# Patient Record
Sex: Female | Born: 1955 | Race: White | Hispanic: No | Marital: Married | Smoking: Former smoker
Health system: Southern US, Community
[De-identification: ages and names within clinical notes are randomized; demographics above are authoritative.]

## PROBLEM LIST (undated history)

## (undated) DIAGNOSIS — Z860100 Personal history of colon polyps, unspecified: Secondary | ICD-10-CM

## (undated) DIAGNOSIS — M1990 Unspecified osteoarthritis, unspecified site: Secondary | ICD-10-CM

## (undated) DIAGNOSIS — E039 Hypothyroidism, unspecified: Secondary | ICD-10-CM

## (undated) DIAGNOSIS — T7840XA Allergy, unspecified, initial encounter: Secondary | ICD-10-CM

## (undated) DIAGNOSIS — R011 Cardiac murmur, unspecified: Secondary | ICD-10-CM

## (undated) DIAGNOSIS — F419 Anxiety disorder, unspecified: Secondary | ICD-10-CM

## (undated) DIAGNOSIS — M8580 Other specified disorders of bone density and structure, unspecified site: Secondary | ICD-10-CM

## (undated) DIAGNOSIS — F32A Depression, unspecified: Secondary | ICD-10-CM

## (undated) DIAGNOSIS — H269 Unspecified cataract: Secondary | ICD-10-CM

## (undated) DIAGNOSIS — G4700 Insomnia, unspecified: Secondary | ICD-10-CM

## (undated) DIAGNOSIS — F988 Other specified behavioral and emotional disorders with onset usually occurring in childhood and adolescence: Secondary | ICD-10-CM

## (undated) DIAGNOSIS — G43009 Migraine without aura, not intractable, without status migrainosus: Secondary | ICD-10-CM

## (undated) DIAGNOSIS — K589 Irritable bowel syndrome without diarrhea: Secondary | ICD-10-CM

## (undated) DIAGNOSIS — N189 Chronic kidney disease, unspecified: Secondary | ICD-10-CM

## (undated) DIAGNOSIS — I10 Essential (primary) hypertension: Secondary | ICD-10-CM

## (undated) DIAGNOSIS — R634 Abnormal weight loss: Secondary | ICD-10-CM

## (undated) DIAGNOSIS — D126 Benign neoplasm of colon, unspecified: Secondary | ICD-10-CM

## (undated) DIAGNOSIS — F329 Major depressive disorder, single episode, unspecified: Secondary | ICD-10-CM

## (undated) HISTORY — DX: Hypothyroidism, unspecified: E03.9

## (undated) HISTORY — DX: Other specified disorders of bone density and structure, unspecified site: M85.80

## (undated) HISTORY — DX: Benign neoplasm of colon, unspecified: D12.6

## (undated) HISTORY — DX: Personal history of colon polyps, unspecified: Z86.0100

## (undated) HISTORY — DX: Unspecified cataract: H26.9

## (undated) HISTORY — DX: Anxiety disorder, unspecified: F41.9

## (undated) HISTORY — DX: Insomnia, unspecified: G47.00

## (undated) HISTORY — DX: Personal history of colonic polyps: Z86.010

## (undated) HISTORY — DX: Irritable bowel syndrome, unspecified: K58.9

## (undated) HISTORY — DX: Abnormal weight loss: R63.4

## (undated) HISTORY — DX: Depression, unspecified: F32.A

## (undated) HISTORY — DX: Allergy, unspecified, initial encounter: T78.40XA

## (undated) HISTORY — DX: Other specified behavioral and emotional disorders with onset usually occurring in childhood and adolescence: F98.8

## (undated) HISTORY — DX: Chronic kidney disease, unspecified: N18.9

## (undated) HISTORY — PX: ABDOMINAL HYSTERECTOMY: SHX81

## (undated) HISTORY — DX: Migraine without aura, not intractable, without status migrainosus: G43.009

## (undated) HISTORY — DX: Cardiac murmur, unspecified: R01.1

## (undated) HISTORY — PX: CATARACT EXTRACTION: SUR2

## (undated) HISTORY — PX: COLONOSCOPY: SHX174

## (undated) HISTORY — PX: EYE SURGERY: SHX253

## (undated) HISTORY — DX: Essential (primary) hypertension: I10

## (undated) HISTORY — DX: Unspecified osteoarthritis, unspecified site: M19.90

## (undated) HISTORY — PX: POLYPECTOMY: SHX149

## (undated) HISTORY — PX: APPENDECTOMY: SHX54

## (undated) HISTORY — DX: Major depressive disorder, single episode, unspecified: F32.9

---

## 2001-01-05 ENCOUNTER — Encounter: Payer: Self-pay | Admitting: Family Medicine

## 2001-01-05 ENCOUNTER — Encounter: Admission: RE | Admit: 2001-01-05 | Discharge: 2001-01-05 | Payer: Self-pay | Admitting: Family Medicine

## 2001-03-05 ENCOUNTER — Encounter: Payer: Self-pay | Admitting: Otolaryngology

## 2001-03-05 ENCOUNTER — Encounter: Admission: RE | Admit: 2001-03-05 | Discharge: 2001-03-05 | Payer: Self-pay | Admitting: Otolaryngology

## 2001-03-24 ENCOUNTER — Encounter: Admission: RE | Admit: 2001-03-24 | Discharge: 2001-03-24 | Payer: Self-pay | Admitting: Internal Medicine

## 2001-07-10 ENCOUNTER — Encounter: Payer: Self-pay | Admitting: Family Medicine

## 2001-07-10 ENCOUNTER — Encounter: Admission: RE | Admit: 2001-07-10 | Discharge: 2001-07-10 | Payer: Self-pay | Admitting: Family Medicine

## 2002-02-02 ENCOUNTER — Encounter: Payer: Self-pay | Admitting: Family Medicine

## 2002-02-02 ENCOUNTER — Encounter: Admission: RE | Admit: 2002-02-02 | Discharge: 2002-02-02 | Payer: Self-pay | Admitting: Family Medicine

## 2002-03-29 ENCOUNTER — Other Ambulatory Visit: Admission: RE | Admit: 2002-03-29 | Discharge: 2002-03-29 | Payer: Self-pay | Admitting: Obstetrics and Gynecology

## 2002-12-07 ENCOUNTER — Encounter: Admission: RE | Admit: 2002-12-07 | Discharge: 2002-12-07 | Payer: Self-pay | Admitting: Family Medicine

## 2002-12-07 ENCOUNTER — Encounter: Payer: Self-pay | Admitting: Family Medicine

## 2004-07-18 ENCOUNTER — Ambulatory Visit: Payer: Self-pay | Admitting: Family Medicine

## 2004-08-19 ENCOUNTER — Encounter: Admission: RE | Admit: 2004-08-19 | Discharge: 2004-08-19 | Payer: Self-pay | Admitting: Orthopedic Surgery

## 2004-09-06 ENCOUNTER — Encounter: Admission: RE | Admit: 2004-09-06 | Discharge: 2004-09-06 | Payer: Self-pay | Admitting: Orthopedic Surgery

## 2005-03-14 ENCOUNTER — Ambulatory Visit: Payer: Self-pay | Admitting: Family Medicine

## 2005-06-26 ENCOUNTER — Ambulatory Visit: Payer: Self-pay | Admitting: Family Medicine

## 2005-07-26 ENCOUNTER — Ambulatory Visit: Payer: Self-pay | Admitting: Internal Medicine

## 2005-07-26 ENCOUNTER — Encounter: Payer: Self-pay | Admitting: Family Medicine

## 2005-08-22 ENCOUNTER — Ambulatory Visit: Payer: Self-pay | Admitting: Family Medicine

## 2005-08-30 ENCOUNTER — Ambulatory Visit: Payer: Self-pay | Admitting: Gastroenterology

## 2005-11-12 ENCOUNTER — Ambulatory Visit: Payer: Self-pay | Admitting: Gastroenterology

## 2005-11-19 ENCOUNTER — Ambulatory Visit: Payer: Self-pay | Admitting: Gastroenterology

## 2005-11-19 ENCOUNTER — Encounter (INDEPENDENT_AMBULATORY_CARE_PROVIDER_SITE_OTHER): Payer: Self-pay | Admitting: *Deleted

## 2005-11-19 DIAGNOSIS — D126 Benign neoplasm of colon, unspecified: Secondary | ICD-10-CM | POA: Insufficient documentation

## 2005-11-19 DIAGNOSIS — K635 Polyp of colon: Secondary | ICD-10-CM | POA: Insufficient documentation

## 2005-11-19 HISTORY — DX: Benign neoplasm of colon, unspecified: D12.6

## 2005-11-19 LAB — HM COLONOSCOPY

## 2005-12-04 ENCOUNTER — Ambulatory Visit: Payer: Self-pay | Admitting: Family Medicine

## 2005-12-05 ENCOUNTER — Ambulatory Visit: Payer: Self-pay | Admitting: Family Medicine

## 2006-01-03 ENCOUNTER — Ambulatory Visit: Payer: Self-pay | Admitting: Family Medicine

## 2006-02-13 ENCOUNTER — Ambulatory Visit: Payer: Self-pay | Admitting: Family Medicine

## 2006-02-27 ENCOUNTER — Ambulatory Visit: Payer: Self-pay | Admitting: Gastroenterology

## 2006-03-20 ENCOUNTER — Ambulatory Visit: Payer: Self-pay | Admitting: Family Medicine

## 2006-03-24 ENCOUNTER — Encounter: Admission: RE | Admit: 2006-03-24 | Discharge: 2006-03-24 | Payer: Self-pay | Admitting: Family Medicine

## 2006-07-31 ENCOUNTER — Ambulatory Visit: Payer: Self-pay | Admitting: Family Medicine

## 2006-07-31 LAB — CONVERTED CEMR LAB
Basophils Absolute: 0.1 10*3/uL (ref 0.0–0.1)
Basophils Relative: 1 % (ref 0.0–1.0)
Hemoglobin: 13.6 g/dL (ref 12.0–15.0)
Lymphocytes Relative: 30.3 % (ref 12.0–46.0)
MCHC: 33.5 g/dL (ref 30.0–36.0)
MCV: 94.5 fL (ref 78.0–100.0)
Monocytes Absolute: 0.5 10*3/uL (ref 0.2–0.7)
Monocytes Relative: 8.8 % (ref 3.0–11.0)
Neutro Abs: 2.9 10*3/uL (ref 1.4–7.7)

## 2006-08-02 ENCOUNTER — Encounter: Admission: RE | Admit: 2006-08-02 | Discharge: 2006-08-02 | Payer: Self-pay | Admitting: Family Medicine

## 2006-08-04 ENCOUNTER — Encounter: Admission: RE | Admit: 2006-08-04 | Discharge: 2006-08-04 | Payer: Self-pay | Admitting: Family Medicine

## 2006-10-16 ENCOUNTER — Ambulatory Visit: Payer: Self-pay | Admitting: Family Medicine

## 2006-12-17 ENCOUNTER — Ambulatory Visit: Payer: Self-pay | Admitting: Family Medicine

## 2006-12-17 LAB — CONVERTED CEMR LAB
Basophils Absolute: 0 10*3/uL (ref 0.0–0.1)
CO2: 34 meq/L — ABNORMAL HIGH (ref 19–32)
Eosinophils Absolute: 0.1 10*3/uL (ref 0.0–0.6)
Eosinophils Relative: 2.2 % (ref 0.0–5.0)
MCV: 92.9 fL (ref 78.0–100.0)
Platelets: 332 10*3/uL (ref 150–400)
RBC: 4.4 M/uL (ref 3.87–5.11)
Sodium: 142 meq/L (ref 135–145)
WBC: 6 10*3/uL (ref 4.5–10.5)

## 2006-12-19 ENCOUNTER — Encounter: Payer: Self-pay | Admitting: Family Medicine

## 2006-12-19 DIAGNOSIS — G43909 Migraine, unspecified, not intractable, without status migrainosus: Secondary | ICD-10-CM | POA: Insufficient documentation

## 2006-12-19 DIAGNOSIS — G43009 Migraine without aura, not intractable, without status migrainosus: Secondary | ICD-10-CM | POA: Insufficient documentation

## 2006-12-19 DIAGNOSIS — F988 Other specified behavioral and emotional disorders with onset usually occurring in childhood and adolescence: Secondary | ICD-10-CM | POA: Insufficient documentation

## 2007-01-07 ENCOUNTER — Inpatient Hospital Stay (HOSPITAL_COMMUNITY): Admission: EM | Admit: 2007-01-07 | Discharge: 2007-01-09 | Payer: Self-pay | Admitting: Emergency Medicine

## 2007-01-07 ENCOUNTER — Ambulatory Visit: Payer: Self-pay | Admitting: Gastroenterology

## 2007-01-13 ENCOUNTER — Ambulatory Visit: Payer: Self-pay | Admitting: Gastroenterology

## 2007-01-19 ENCOUNTER — Emergency Department (HOSPITAL_COMMUNITY): Admission: EM | Admit: 2007-01-19 | Discharge: 2007-01-19 | Payer: Self-pay | Admitting: Emergency Medicine

## 2007-01-21 ENCOUNTER — Ambulatory Visit: Payer: Self-pay | Admitting: Family Medicine

## 2007-01-27 ENCOUNTER — Ambulatory Visit: Payer: Self-pay | Admitting: Family Medicine

## 2007-02-19 ENCOUNTER — Ambulatory Visit: Payer: Self-pay | Admitting: Family Medicine

## 2007-04-02 ENCOUNTER — Encounter: Payer: Self-pay | Admitting: Family Medicine

## 2007-04-09 ENCOUNTER — Encounter: Admission: RE | Admit: 2007-04-09 | Discharge: 2007-04-09 | Payer: Self-pay | Admitting: Family Medicine

## 2007-04-16 ENCOUNTER — Encounter: Payer: Self-pay | Admitting: Family Medicine

## 2007-06-30 ENCOUNTER — Ambulatory Visit: Payer: Self-pay | Admitting: Family Medicine

## 2007-06-30 DIAGNOSIS — M899 Disorder of bone, unspecified: Secondary | ICD-10-CM | POA: Insufficient documentation

## 2007-06-30 DIAGNOSIS — G4700 Insomnia, unspecified: Secondary | ICD-10-CM | POA: Insufficient documentation

## 2007-06-30 DIAGNOSIS — M949 Disorder of cartilage, unspecified: Secondary | ICD-10-CM

## 2007-06-30 HISTORY — DX: Disorder of bone, unspecified: M89.9

## 2007-08-12 ENCOUNTER — Ambulatory Visit: Payer: Self-pay | Admitting: Family Medicine

## 2007-08-12 DIAGNOSIS — F341 Dysthymic disorder: Secondary | ICD-10-CM | POA: Insufficient documentation

## 2007-08-20 ENCOUNTER — Ambulatory Visit: Payer: Self-pay | Admitting: Family Medicine

## 2007-08-20 DIAGNOSIS — R634 Abnormal weight loss: Secondary | ICD-10-CM

## 2007-08-20 HISTORY — DX: Abnormal weight loss: R63.4

## 2007-09-09 ENCOUNTER — Ambulatory Visit: Payer: Self-pay | Admitting: Family Medicine

## 2007-09-09 DIAGNOSIS — R197 Diarrhea, unspecified: Secondary | ICD-10-CM | POA: Insufficient documentation

## 2007-09-09 DIAGNOSIS — K589 Irritable bowel syndrome without diarrhea: Secondary | ICD-10-CM | POA: Insufficient documentation

## 2007-10-08 ENCOUNTER — Ambulatory Visit: Payer: Self-pay | Admitting: Family Medicine

## 2007-10-08 DIAGNOSIS — I10 Essential (primary) hypertension: Secondary | ICD-10-CM | POA: Insufficient documentation

## 2007-11-12 IMAGING — US US CAROTID DUPLEX BILAT
1 series · 13 of 24 positions shown · non-contrast
Comparison: none

CLINICAL DATA: Right carotid bruit, dizziness, and hypertension. 
 BILATERAL CAROTID DUPLEX ULTRASOUND:
 No prior ultrasound studies for comparison.

[Series 1: unknown · 0.06mm/px · 13 of 58 slices shown]
[im 1/58]
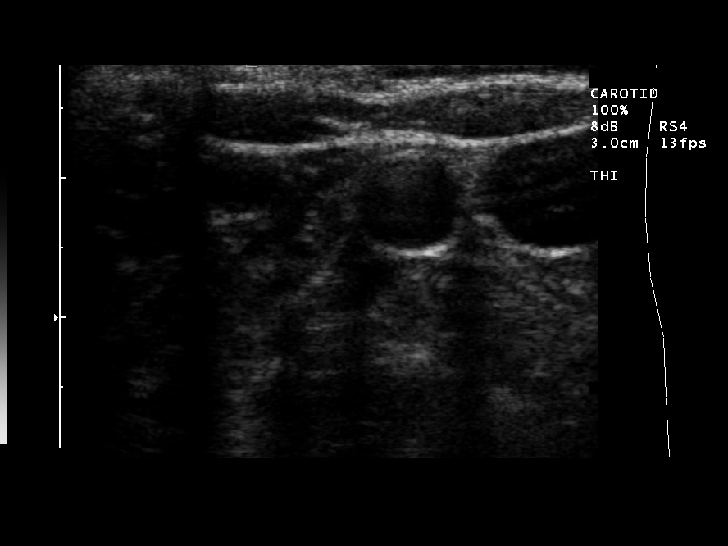
[im 5/58]
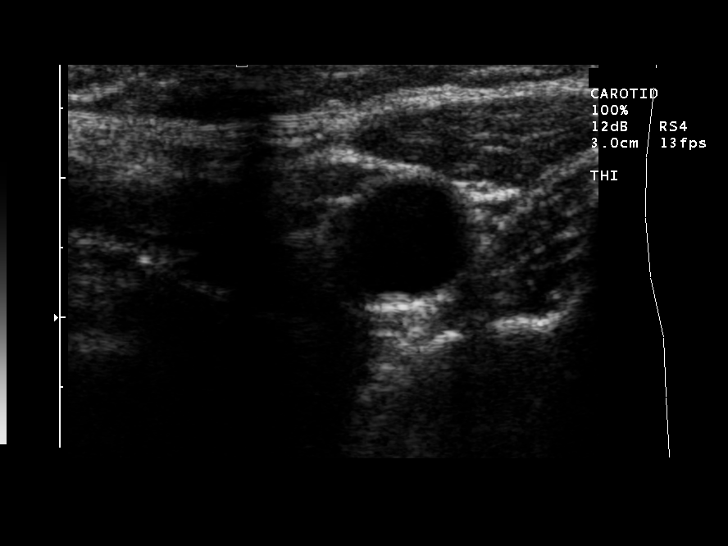
[im 10/58]
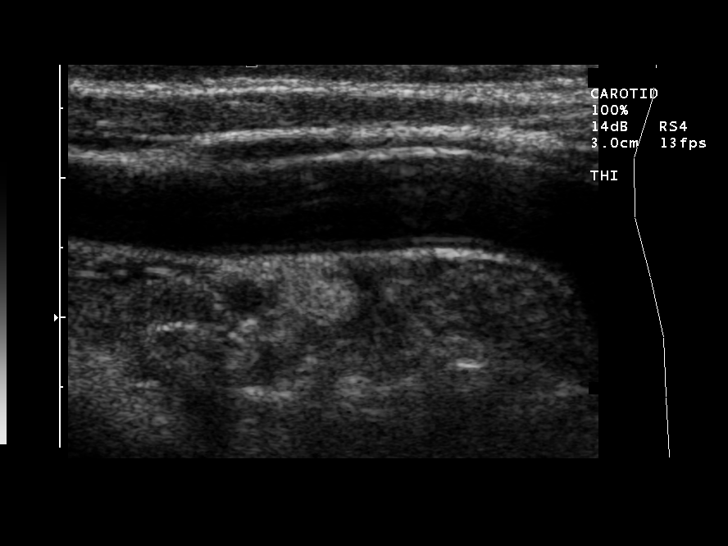
[im 15/58]
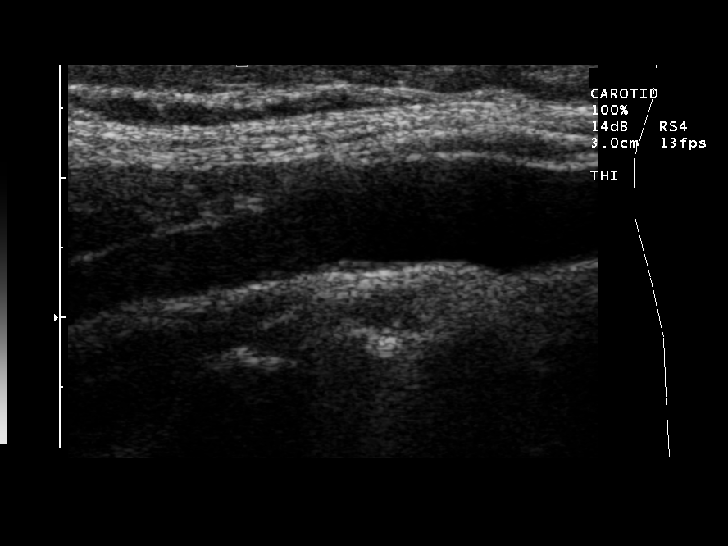
[im 20/58]
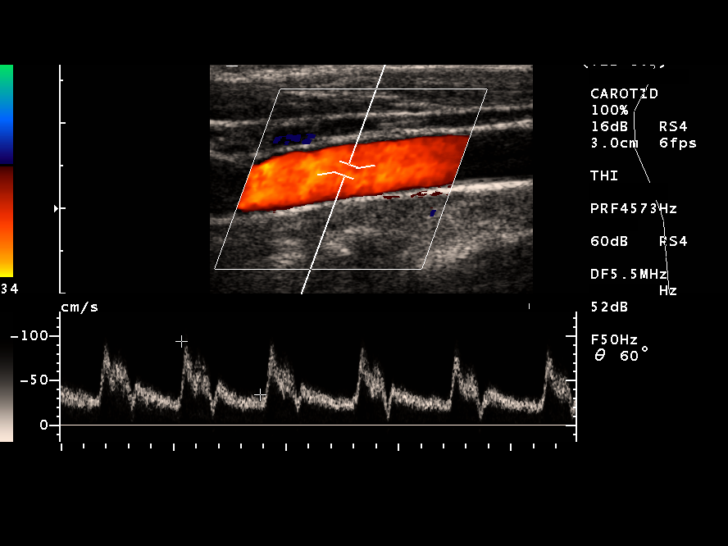
[im 25/58]
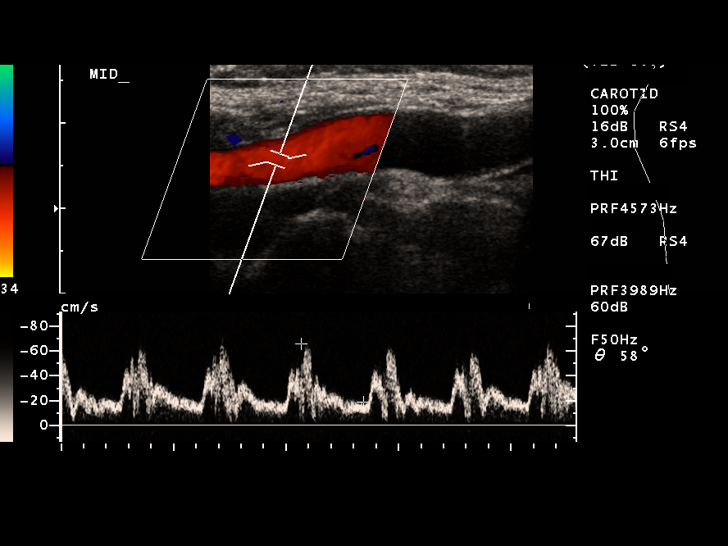
[im 30/58]
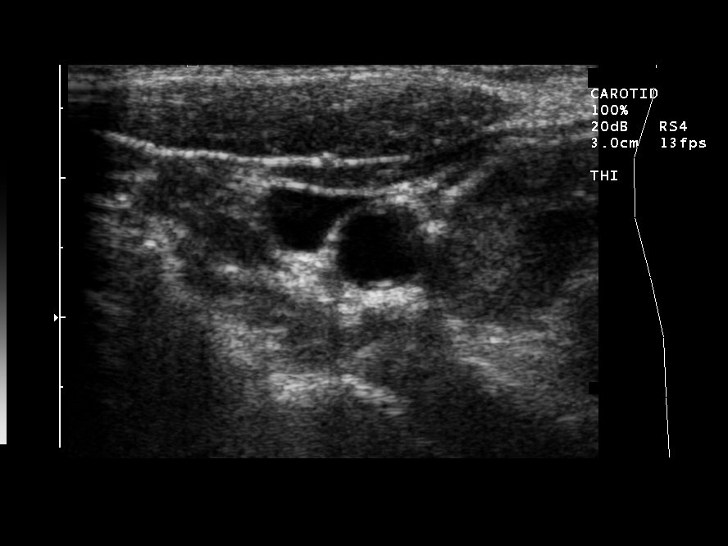
[im 33/58]
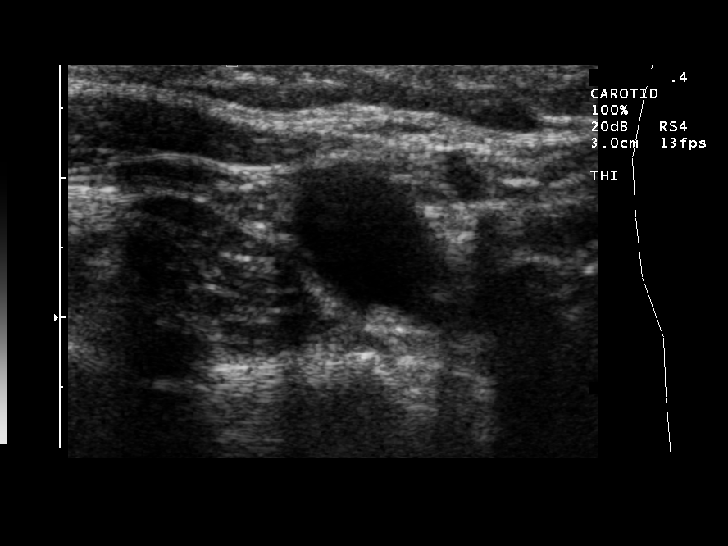
[im 38/58]
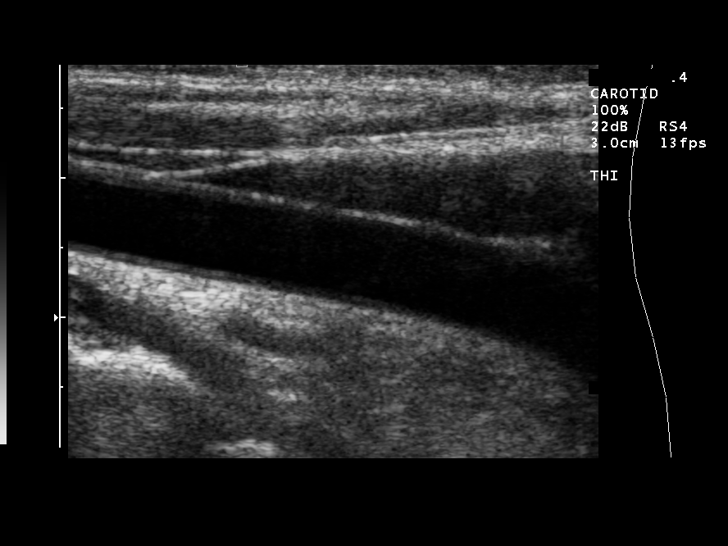
[im 43/58]
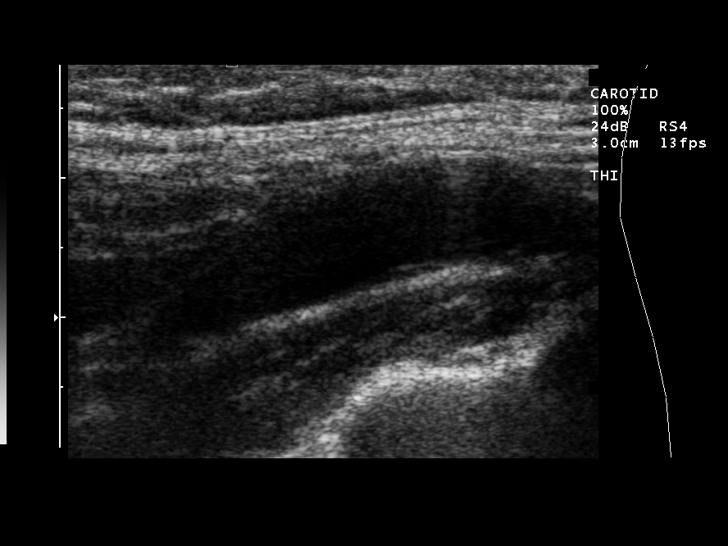
[im 48/58]
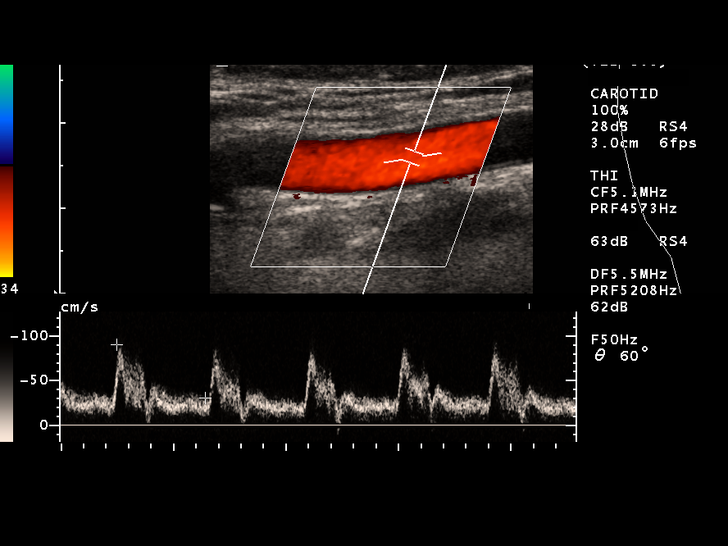
[im 53/58]
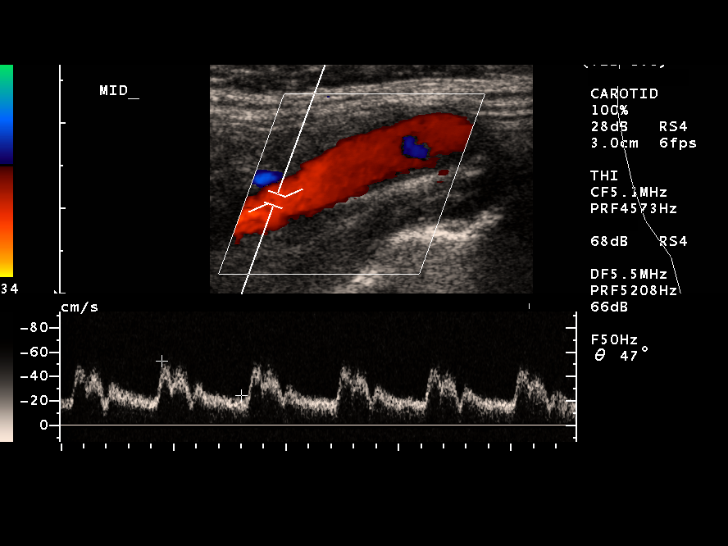
[im 58/58]
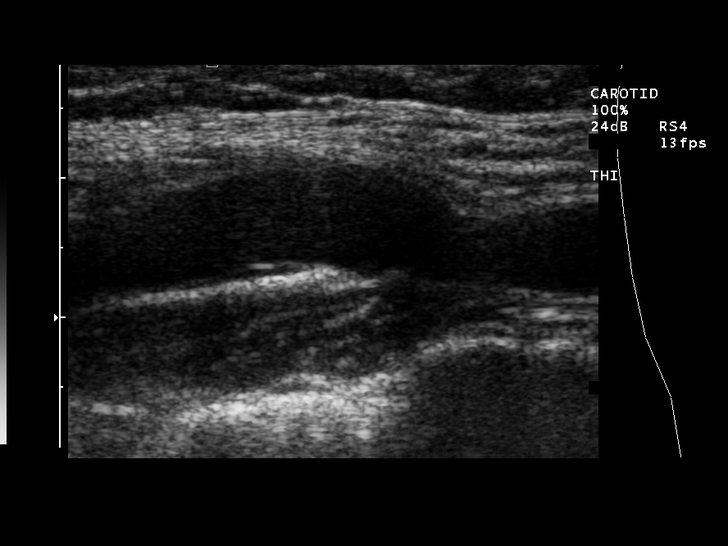

[13 of 24 positions shown; findings below may reference images not displayed]

FINDINGS: The following Doppler flow velocity measurements were obtained (in cm/sec):
 SITE:  PEAK SYSTOLIC  END DIASTOLIC
 RIGHT ICA:  80  24 
 RIGHT CCA:  93  34
 RIGHT ICA/CCA RATIO:  .86  .70
 RIGHT ECA:  86
 LEFT ICA:  86  25
 LEFT CCA:  107  26
 LEFT ICA/CCA RATIO:  .80  .97
 LEFT ECA:  76
 Criteria:  Quantification of carotid stenosis is based on velocity parameters that correlate the residual internal carotid diameter with NASCET ? based stenosis levels. 
 The right carotid bifurcation shows minimal calcified plaque at the level of the distal bulb.  Obtained wave forms and velocities in the common carotid artery, internal carotid artery, and external carotid artery are normal.  Estimated proximal right ICA stenosis is less than 50%.  Antegrade flow is demonstrated in the right vertebral artery. 
 The left carotid bifurcation shows a small amount of plaque at the level of the bulb and extending into the proximal ICA.  Overall plaque burden on the left is slightly greater than on the right.  Obtained wave forms and velocities are normal in the common carotid, internal carotid, and external carotid arteries.  Estimated left ICA stenosis is less than 50% in the neck.  Antegrade flow is also detected in the left vertebral artery.
IMPRESSION: No evidence of significant carotid stenoses by duplex ultrasound.  Estimated bilateral less than 50% proximal ICA stenoses based on velocity criteria.  A small amount of plaque is present at both carotid bifurcations, left greater than right.

## 2007-11-18 ENCOUNTER — Ambulatory Visit: Payer: Self-pay | Admitting: Family Medicine

## 2007-11-25 ENCOUNTER — Telehealth: Payer: Self-pay | Admitting: Family Medicine

## 2007-12-16 ENCOUNTER — Ambulatory Visit: Payer: Self-pay | Admitting: Family Medicine

## 2008-01-14 ENCOUNTER — Telehealth: Payer: Self-pay | Admitting: Family Medicine

## 2008-01-20 ENCOUNTER — Telehealth: Payer: Self-pay | Admitting: Family Medicine

## 2008-01-21 ENCOUNTER — Telehealth: Payer: Self-pay | Admitting: Family Medicine

## 2008-01-26 ENCOUNTER — Telehealth (INDEPENDENT_AMBULATORY_CARE_PROVIDER_SITE_OTHER): Payer: Self-pay | Admitting: *Deleted

## 2008-01-26 ENCOUNTER — Encounter: Payer: Self-pay | Admitting: Family Medicine

## 2008-04-28 IMAGING — CT CT PELVIS W/O CM
1 series · 15 of 32 positions shown, 19 images · IV contrast (agent unspecified)
Comparison: 01/07/07.

CLINICAL DATA: Lower abdominal and flank pain for 3 days.  Assess for kidney stone. 
 ABDOMEN CT WITHOUT CONTRAST:
TECHNIQUE: Multidetector CT imaging of the abdomen was performed following the standard protocol without IV contrast.
TECHNIQUE: Multidetector CT imaging of the pelvis was performed following the standard protocol without IV contrast.

[Series 2: stone_wo 5.0 b40f st · axial · 0.66mm/px · z∈[-381,-41]mm · 15 of 96 slices shown, 19 images]
[im 7/96  soft-tissue]
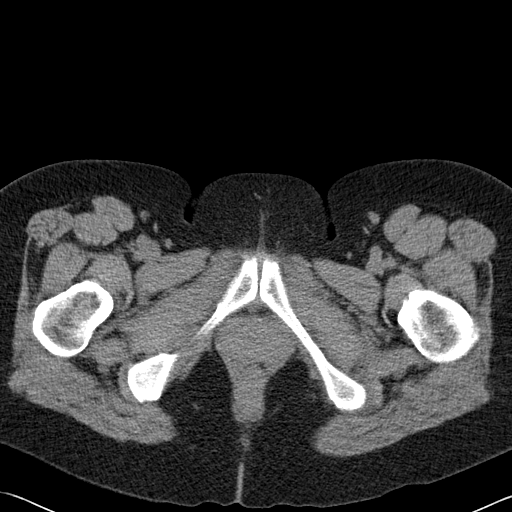
[im 7/96  bone]
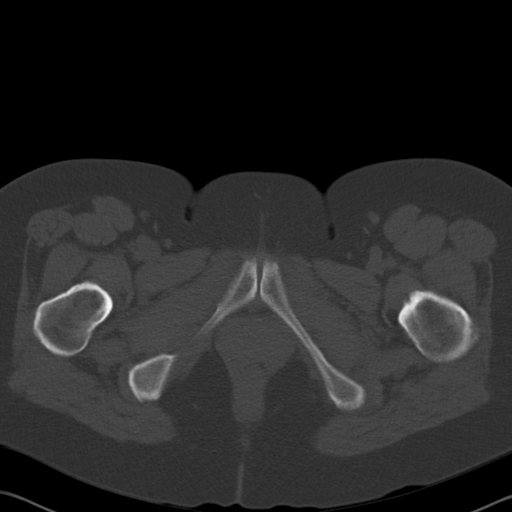
[im 13/96  soft-tissue]
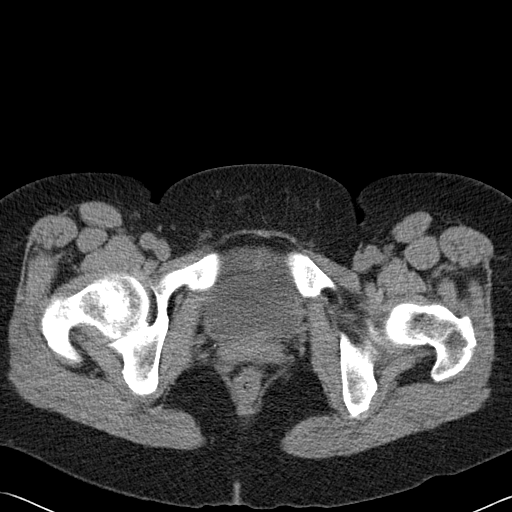
[im 19/96  soft-tissue]
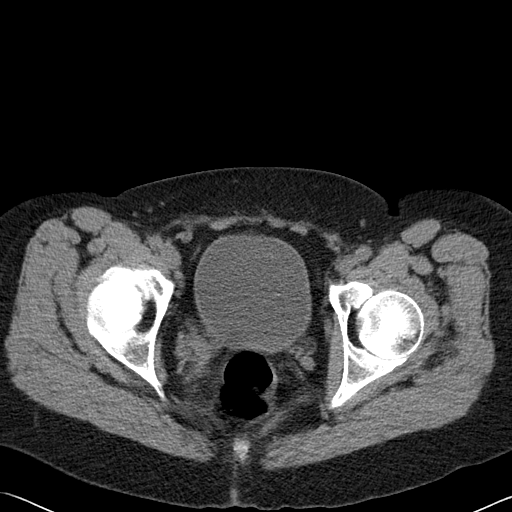
[im 28/96  soft-tissue]
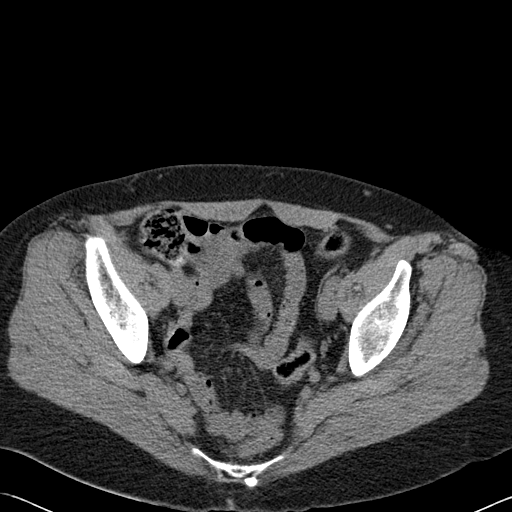
[im 34/96  soft-tissue]
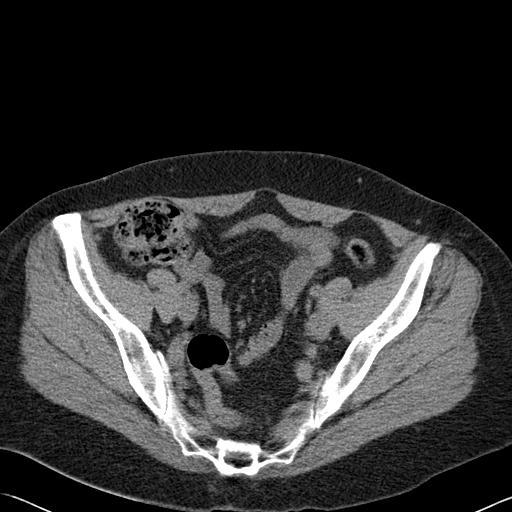
[im 40/96  soft-tissue]
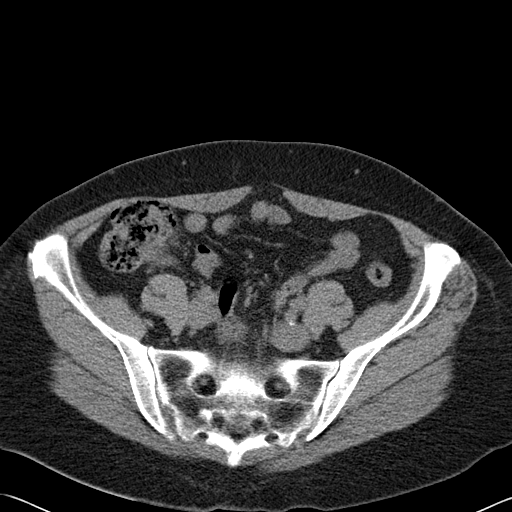
[im 50/96  soft-tissue]
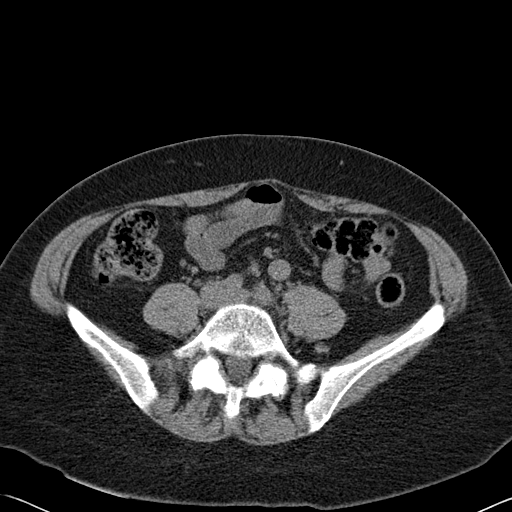
[im 56/96  soft-tissue]
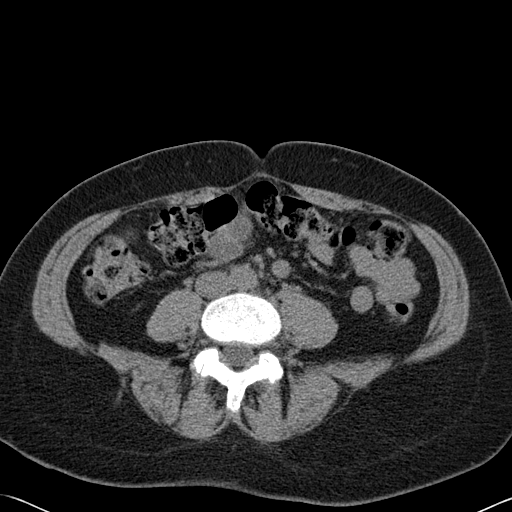
[im 62/96  soft-tissue]
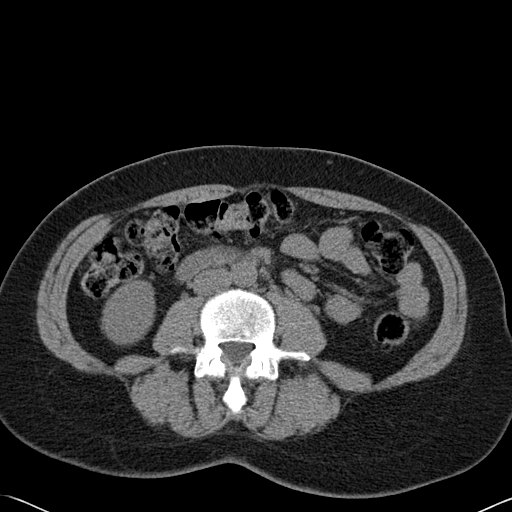
[im 62/96  bone]
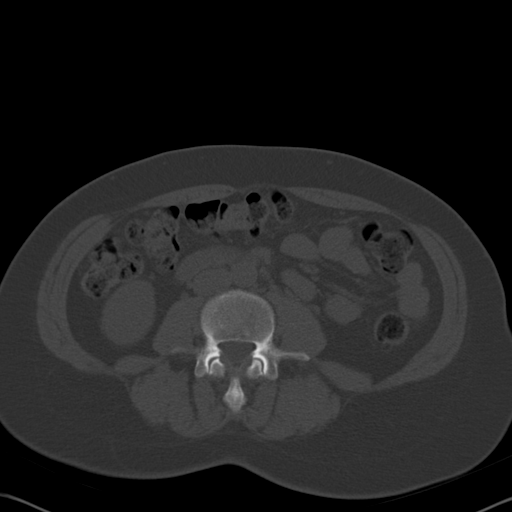
[im 68/96  soft-tissue]
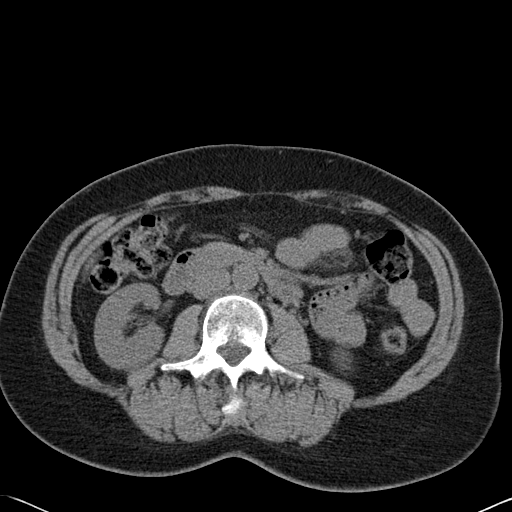
[im 77/96  soft-tissue]
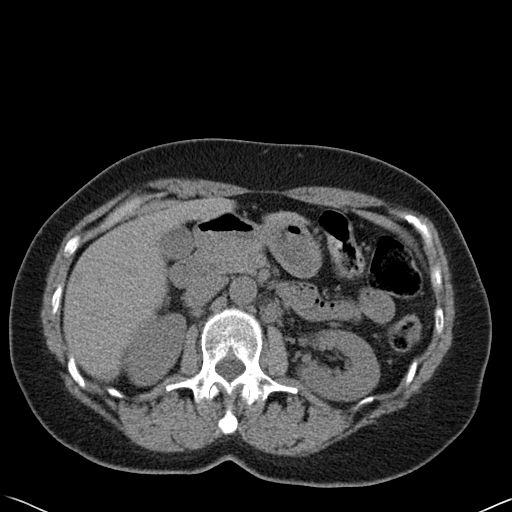
[im 83/96  soft-tissue]
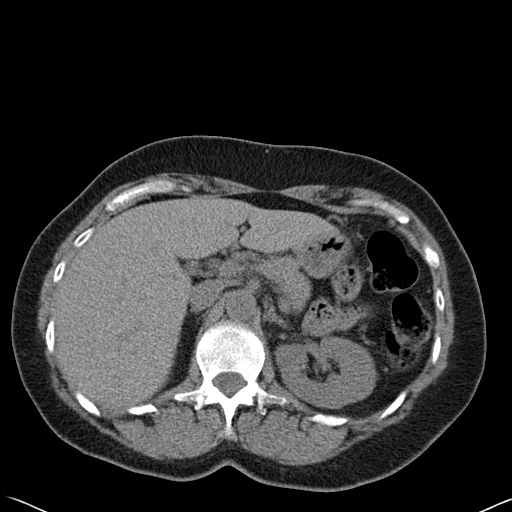
[im 83/96  lung]
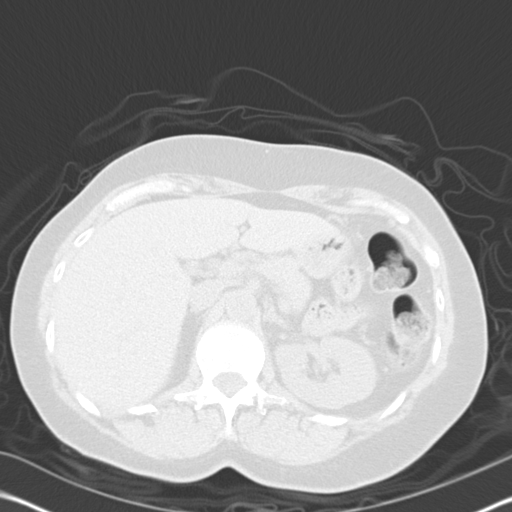
[im 86/96  lung]
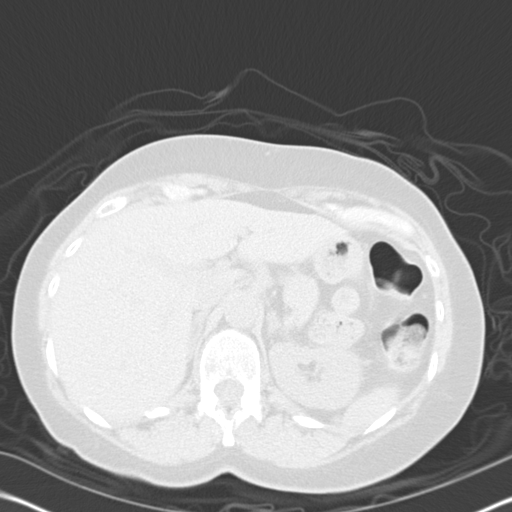
[im 89/96  soft-tissue]
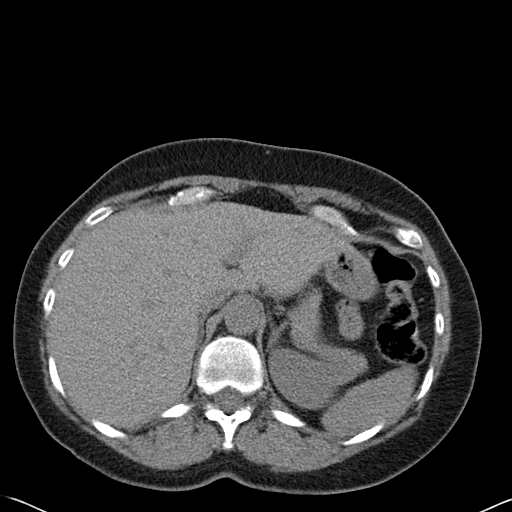
[im 89/96  lung]
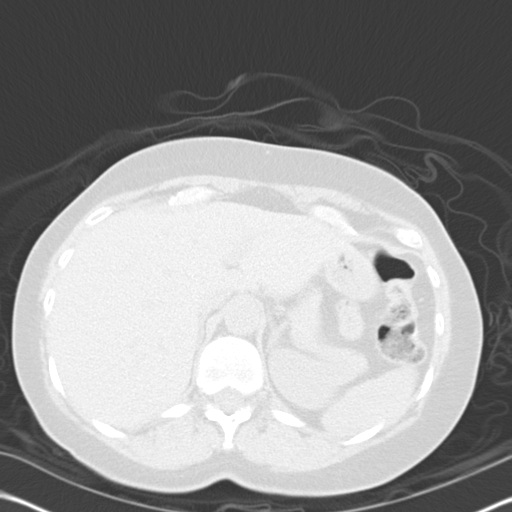
[im 92/96  lung]
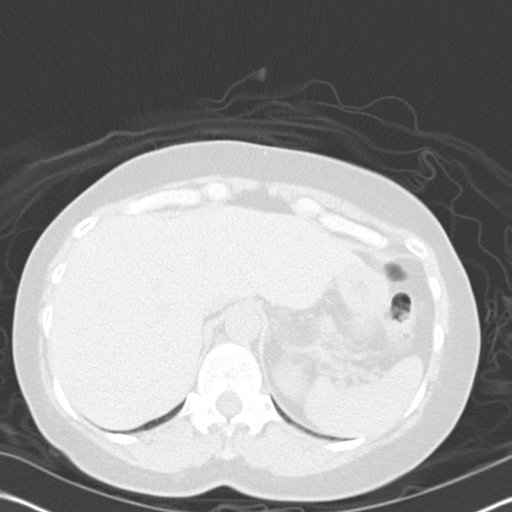

[15 of 32 positions shown; findings below may reference images not displayed]

FINDINGS: A low-dose technique was utilized.  The part of the liver we see includes the very lower aspect of a hemangioma previously demonstrated.  The rest of the organ appears normal.  The spleen, pancreas, and adrenal glands are normal.  
 The kidneys are normal in size, shape, and position.  No stone disease.  The aorta and IVC are unremarkable.  No free intraperitoneal fluid or air.  No acute bowel pathology evident.
IMPRESSION: No evidence of urinary tract stone disease. 
 PELVIS CT WITHOUT CONTRAST:
FINDINGS: No stones are seen in the ureters or bladder.   Patient has had hysterectomy.  No sign of mass or adenopathy.  No free fluid.   No bowel pathology evident.
IMPRESSION: Negative CT scan of the pelvis.

## 2009-02-02 ENCOUNTER — Ambulatory Visit: Payer: Self-pay | Admitting: Family Medicine

## 2009-02-06 ENCOUNTER — Ambulatory Visit: Payer: Self-pay | Admitting: Family Medicine

## 2009-02-06 LAB — CONVERTED CEMR LAB
Nitrite: NEGATIVE
Urobilinogen, UA: 0.2

## 2009-02-09 LAB — CONVERTED CEMR LAB
ALT: 14 units/L (ref 0–35)
AST: 26 units/L (ref 0–37)
Basophils Relative: 1 % (ref 0.0–3.0)
Bilirubin, Direct: 0.1 mg/dL (ref 0.0–0.3)
Chloride: 109 meq/L (ref 96–112)
Eosinophils Relative: 5.1 % — ABNORMAL HIGH (ref 0.0–5.0)
HCT: 40.9 % (ref 36.0–46.0)
LDL Cholesterol: 107 mg/dL — ABNORMAL HIGH (ref 0–99)
Lymphs Abs: 1.3 10*3/uL (ref 0.7–4.0)
MCV: 96.4 fL (ref 78.0–100.0)
Monocytes Absolute: 0.4 10*3/uL (ref 0.1–1.0)
Neutrophils Relative %: 53.2 % (ref 43.0–77.0)
Potassium: 4.1 meq/L (ref 3.5–5.1)
RBC: 4.25 M/uL (ref 3.87–5.11)
Total CHOL/HDL Ratio: 3
Total Protein: 7.3 g/dL (ref 6.0–8.3)
WBC: 4 10*3/uL — ABNORMAL LOW (ref 4.5–10.5)

## 2009-06-20 ENCOUNTER — Telehealth: Payer: Self-pay | Admitting: Family Medicine

## 2009-10-20 ENCOUNTER — Encounter: Payer: Self-pay | Admitting: Family Medicine

## 2009-10-25 ENCOUNTER — Telehealth: Payer: Self-pay | Admitting: Family Medicine

## 2009-11-07 ENCOUNTER — Encounter: Payer: Self-pay | Admitting: Family Medicine

## 2009-11-29 ENCOUNTER — Ambulatory Visit: Payer: Self-pay | Admitting: Family Medicine

## 2009-12-07 ENCOUNTER — Ambulatory Visit: Payer: Self-pay | Admitting: Family Medicine

## 2009-12-07 DIAGNOSIS — M51379 Other intervertebral disc degeneration, lumbosacral region without mention of lumbar back pain or lower extremity pain: Secondary | ICD-10-CM

## 2009-12-07 DIAGNOSIS — M5137 Other intervertebral disc degeneration, lumbosacral region: Secondary | ICD-10-CM

## 2009-12-07 DIAGNOSIS — M5126 Other intervertebral disc displacement, lumbar region: Secondary | ICD-10-CM | POA: Insufficient documentation

## 2009-12-07 HISTORY — DX: Other intervertebral disc degeneration, lumbosacral region without mention of lumbar back pain or lower extremity pain: M51.379

## 2009-12-07 HISTORY — DX: Other intervertebral disc degeneration, lumbosacral region: M51.37

## 2009-12-08 ENCOUNTER — Encounter: Admission: RE | Admit: 2009-12-08 | Discharge: 2009-12-08 | Payer: Self-pay | Admitting: Family Medicine

## 2009-12-08 ENCOUNTER — Telehealth: Payer: Self-pay | Admitting: Family Medicine

## 2009-12-08 ENCOUNTER — Emergency Department (HOSPITAL_COMMUNITY): Admission: EM | Admit: 2009-12-08 | Discharge: 2009-12-08 | Payer: Self-pay | Admitting: Emergency Medicine

## 2009-12-13 ENCOUNTER — Telehealth: Payer: Self-pay | Admitting: Family Medicine

## 2009-12-19 ENCOUNTER — Ambulatory Visit: Payer: Self-pay | Admitting: Family Medicine

## 2009-12-19 DIAGNOSIS — M7660 Achilles tendinitis, unspecified leg: Secondary | ICD-10-CM | POA: Insufficient documentation

## 2009-12-19 DIAGNOSIS — G609 Hereditary and idiopathic neuropathy, unspecified: Secondary | ICD-10-CM | POA: Insufficient documentation

## 2009-12-19 HISTORY — DX: Achilles tendinitis, unspecified leg: M76.60

## 2009-12-27 ENCOUNTER — Ambulatory Visit: Payer: Self-pay | Admitting: Family Medicine

## 2009-12-27 DIAGNOSIS — R209 Unspecified disturbances of skin sensation: Secondary | ICD-10-CM

## 2009-12-27 DIAGNOSIS — J309 Allergic rhinitis, unspecified: Secondary | ICD-10-CM

## 2009-12-27 HISTORY — DX: Allergic rhinitis, unspecified: J30.9

## 2009-12-27 HISTORY — DX: Unspecified disturbances of skin sensation: R20.9

## 2010-01-02 ENCOUNTER — Telehealth: Payer: Self-pay | Admitting: Family Medicine

## 2010-01-03 ENCOUNTER — Encounter: Payer: Self-pay | Admitting: Family Medicine

## 2010-01-22 ENCOUNTER — Encounter: Admission: RE | Admit: 2010-01-22 | Discharge: 2010-01-22 | Payer: Self-pay | Admitting: Orthopedic Surgery

## 2010-01-22 ENCOUNTER — Encounter: Payer: Self-pay | Admitting: Family Medicine

## 2010-01-29 ENCOUNTER — Encounter: Payer: Self-pay | Admitting: Family Medicine

## 2010-02-16 ENCOUNTER — Encounter: Admission: RE | Admit: 2010-02-16 | Discharge: 2010-02-16 | Payer: Self-pay | Admitting: Orthopedic Surgery

## 2010-02-16 ENCOUNTER — Encounter: Payer: Self-pay | Admitting: Family Medicine

## 2010-02-17 ENCOUNTER — Encounter: Payer: Self-pay | Admitting: Family Medicine

## 2010-02-22 ENCOUNTER — Ambulatory Visit: Payer: Self-pay | Admitting: Family Medicine

## 2010-02-22 DIAGNOSIS — E041 Nontoxic single thyroid nodule: Secondary | ICD-10-CM | POA: Insufficient documentation

## 2010-02-28 ENCOUNTER — Encounter: Payer: Self-pay | Admitting: Family Medicine

## 2010-03-03 ENCOUNTER — Encounter: Payer: Self-pay | Admitting: Family Medicine

## 2010-03-14 ENCOUNTER — Encounter: Payer: Self-pay | Admitting: Family Medicine

## 2010-03-20 ENCOUNTER — Ambulatory Visit (HOSPITAL_COMMUNITY)
Admission: RE | Admit: 2010-03-20 | Discharge: 2010-03-20 | Payer: Self-pay | Admitting: Pediatric Hematology and Oncology

## 2010-04-02 ENCOUNTER — Encounter: Payer: Self-pay | Admitting: Family Medicine

## 2010-04-05 ENCOUNTER — Telehealth: Payer: Self-pay | Admitting: Family Medicine

## 2010-04-27 ENCOUNTER — Telehealth (INDEPENDENT_AMBULATORY_CARE_PROVIDER_SITE_OTHER): Payer: Self-pay | Admitting: *Deleted

## 2010-05-31 ENCOUNTER — Telehealth: Payer: Self-pay | Admitting: Family Medicine

## 2010-06-29 ENCOUNTER — Telehealth: Payer: Self-pay | Admitting: Family Medicine

## 2010-06-29 ENCOUNTER — Encounter: Payer: Self-pay | Admitting: Family Medicine

## 2010-07-28 ENCOUNTER — Ambulatory Visit: Payer: Self-pay | Admitting: Family Medicine

## 2010-09-02 ENCOUNTER — Encounter: Payer: Self-pay | Admitting: Family Medicine

## 2010-09-08 ENCOUNTER — Encounter: Payer: Self-pay | Admitting: Family Medicine

## 2010-09-11 NOTE — Medication Information (Signed)
Summary: Coverage Approval for Adderall  Coverage Approval for Adderall   Imported By: Maryln Gottron 12/04/2009 13:29:00  _____________________________________________________________________  External Attachment:    Type:   Image     Comment:   External Document

## 2010-09-11 NOTE — Progress Notes (Signed)
Summary: referral to headache clinic   Phone Note Other Incoming   Caller: [facility]   Summary of Call: received fax on claims that suggest pt receiving zomig for acute migraine headaches and concerns from [facility] that pt may be overusing med.  Initial call taken by: Pura Spice, RN,  October 25, 2009 9:33 AM  Follow-up for Phone Call        per dr Scotty Court call pt and refer to headache clinic.  pt notified and info given and states any time for appt would be fine with her. informed pt our PCP will call with appt time.  Follow-up by: Pura Spice, RN,  October 25, 2009 9:33 AM

## 2010-09-11 NOTE — Letter (Signed)
Summary: [facility]   Imported By: Maryln Gottron 03/12/2010 11:17:38  _____________________________________________________________________  External Attachment:    Type:   Image     Comment:   External Document

## 2010-09-11 NOTE — Assessment & Plan Note (Signed)
Summary: numbness in lft foot/pain in rt foot/cjr   Vital Signs:  Patient profile:   55 year old female Weight:      173 pounds O2 Sat:      98 % Temp:     97.3 degrees F Pulse rate:   76 / minute BP sitting:   160 / 96  (left arm)  Vitals Entered By: Pura Spice, RN (Dec 27, 2009 10:46 AM) CC: left foot numb and rt foot painful    History of Present Illness: This 55 year old white married female who has had problem with lumbosacral disc disease severe back pain and sciatica in the left leg and also numbness in the left lower leg foot medical. Pain had subsided and are well except numbness of the left lower leg and foot continued to persistentdespite taking Lyrica 50 mg t.i.d. She had been treated for Achilles tendinitis which also has not responded to treatment and persist, per painful when she walks She has pain in the right shoulder which is previous and described as subdeltoid bursitis and has been treated by Dr. Cleophas Dunker, orthopedist continues to be painful She has continued to need hydrocodone for pain  Allergies: 1)  ! Codeine 2)  ! 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) ESSENTIAL HYPERTENSION, BENIGN (ICD-401.1) OTITIS MEDIA, ACUTE (ICD-382.9) DIARRHEA (ICD-787.91) IRRITABLE BOWEL SYNDROME (ICD-564.1) ABDOMINAL PAIN, LEFT LOWER QUADRANT (ICD-789.04) WEIGHT LOSS (ICD-783.21) ANXIETY DEPRESSION (ICD-300.4) INGROWN TOENAIL (ICD-703.0) INSOMNIA, CHRONIC (ICD-307.42) ATYPICAL FACE PAIN (ICD-350.2) OSTEOPENIA (ICD-733.90) ADD (ICD-314.00) COMMON MIGRAINE (ICD-346.10) lumbosacral disc disease  Review of Systems      See HPI  The patient denies anorexia, fever, weight loss, weight gain, vision  loss, decreased hearing, hoarseness, chest pain, syncope, dyspnea on exertion, peripheral edema, prolonged cough, headaches, hemoptysis, abdominal pain, melena, hematochezia, severe indigestion/heartburn, hematuria, incontinence, genital sores, muscle weakness, suspicious skin lesions, transient blindness, difficulty walking, depression, unusual weight change, abnormal bleeding, enlarged lymph nodes, angioedema, breast masses, and testicular masses.    Physical Exam  General:  Well-developed,well-nourished,in no acute distress; alert,appropriate and cooperative throughout examination Nose:  boggy nasal mucosa with clear drainage Lungs:  Normal respiratory effort, chest expands symmetrically. Lungs are clear to auscultation, no crackles or wheezes. Heart:  Normal rate and regular rhythm. S1 and S2 normal without gallop, murmur, click, rub or other extra sounds. Msk:  tenderness over right shoulder and area of deltoid bursa and limited hyperextension and movement posteriorly for acromioclavicular bursa is negative Tenderness over the Achilles tendon insertion and pain on pressure as well as all hyperextension of the ankle Decreased sensation over the left lower leg on the knee involving ankle and foot Pulses:  R and L carotid,radial,femoral,dorsalis pedis and posterior tibial pulses are full and equal bilaterally Extremities:  No clubbing, cyanosis, edema, or deformity noted with normal full range of motion of all joints.   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  Problem # 2:  DISC DISEASE, LUMBOSACRAL SPINE (ICD-722.52) Assessment: Improved  Orders: Neurology Referral (Neuro)  Problem # 3:  RHINITIS (ICD-477.9) Assessment: New  Her  updated medication list for this  problem includes:    Nasonex 50 Mcg/act Susp (Mometasone furoate) .Marland Kitchen... 2 sprays each nostril daily  Problem # 4:  ACHILLES TENDINITIS (ICD-726.71) Assessment: Deteriorated referral to orthopedist for treatment  Problem # 5:  ESSENTIAL HYPERTENSION, BENIGN (ICD-401.1) Assessment: Unchanged blood pressure elevated GU to pain and anxiety  Problem # 6:  ANXIETY DEPRESSION (ICD-300.4) Assessment: Unchanged  Complete Medication List: 1)  Adult Aspirin Low Strength 81 Mg Chew (Aspirin) .... Take 1 tablet by mouth once a day 2)  Imitrex 6 Mg/0.75ml Soln (Sumatriptan succinate) .... Injection  at onset of headache 3)  Zoloft 50 Mg Tabs (Sertraline hcl) .Marland Kitchen.. 1  tab qd 4)  Zomig 5 Mg Tabs (Zolmitriptan) .Marland Kitchen.. 1 by mouth once daily if needed for migraine . 5)  Adderall 20 Mg Tabs (Amphetamine-dextroamphetamine) .Marland Kitchen.. 1 by mouth three times a day 6)  Flexeril 10 Mg Tabs (Cyclobenzaprine hcl) .Marland Kitchen.. 1 tid 7)  Lyrica 50 Mg Caps (Pregabalin) .Marland Kitchen.. 1 morn , midafternoon and hs for numbness or neuropathy 8)  Hydrocodone-acetaminophen 10-660 Mg Tabs (Hydrocodone-acetaminophen) .... One q.4 h. p.r.n. for pain not over 4 per day 9)  Lodine 400 Mg  .... One b.i.d. p.c., prescribed by dr. Cleophas Dunker 10)  Nasonex 50 Mcg/act Susp (Mometasone furoate) .... 2 sprays each nostril daily  Patient Instructions: 1)  peripheral neuropathy process so referral arrange for a neurological consultation 2)  Referral to Dr. Sandria Manly 3 for treatment of the Achilles tendon on his and right shoulder 3)  Continue hydrocodone as needed and other medications as prescribed

## 2010-09-11 NOTE — Medication Information (Signed)
Summary[facility]  [facility]   Imported By: Maryln Gottron 10/26/2009 13:31:07  _____________________________________________________________________  External Attachment:    Type:   Image     Comment:   External Document

## 2010-09-11 NOTE — Assessment & Plan Note (Signed)
Summary: leg numbness/njr   Vital Signs:  Patient profile:   55 year old female Weight:      172 pounds O2 Sat:      98 % Temp:     98.2 degrees F Pulse rate:   84 / minute BP sitting:   160 / 90  (left arm)  Vitals Entered By: Pura Spice, RN (Dec 19, 2009 1:07 PM) CC: still numbness left foot wants rt shlulder ck'd saw Dr Cleophas Dunker for it. and wants Rt heel ck'd    History of Present Illness: This 55 year old white married female was seen on 428 with pain over her lumbosacral spine with radiation of pain into the left leg sciatica,signs and symptoms were compatible with herniated disc but this did not prove out by MRI patient mental prednisone bed rest and analgesic and has improved as far as pain but complains of numbness of left lower tibia into the ankle and foot Another complaint is that of pain insertion area of the right Achilles tendon which is painful when walking She has had no headaches turned all of this the back problem  Allergies: 1)  ! Codeine 2)  ! 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) ESSENTIAL HYPERTENSION, BENIGN (ICD-401.1) OTITIS MEDIA, ACUTE (ICD-382.9) DIARRHEA (ICD-787.91) IRRITABLE BOWEL SYNDROME (ICD-564.1) ABDOMINAL PAIN, LEFT LOWER QUADRANT (ICD-789.04) WEIGHT LOSS (ICD-783.21) ANXIETY DEPRESSION (ICD-300.4) INGROWN TOENAIL (ICD-703.0) INSOMNIA, CHRONIC (ICD-307.42) ATYPICAL FACE PAIN (ICD-350.2) OSTEOPENIA (ICD-733.90) ADD (ICD-314.00) COMMON MIGRAINE (ICD-346.10)  Past Surgical History: Last updated: 01/16/2008 Cataract extraction Hysterectomy (1996)  Risk Factors: Smoking Status: quit (12/19/2006)  Review of Systems      See HPI  The patient denies anorexia, fever, weight loss, weight gain, vision loss, decreased hearing, hoarseness, chest pain, syncope, dyspnea on exertion,  peripheral edema, prolonged cough, headaches, hemoptysis, abdominal pain, melena, hematochezia, severe indigestion/heartburn, hematuria, incontinence, genital sores, muscle weakness, suspicious skin lesions, transient blindness, difficulty walking, depression, unusual weight change, abnormal bleeding, enlarged lymph nodes, angioedema, breast masses, and testicular masses.    Physical Exam  General:  Well-developed,well-nourished,in no acute distress; alert,appropriate and cooperative throughout examination Lungs:  Normal respiratory effort, chest expands symmetrically. Lungs are clear to auscultation, no crackles or wheezes. Heart:  Normal rate and regular rhythm. S1 and S2 normal without gallop, murmur, click, rub or other extra sounds. Msk:  muscle spasm of lumbar spine is . 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  Problem # 3:  DISC DISEASE, LUMBOSACRAL SPINE (ICD-722.52) Assessment: Improved  Orders: Depo- Medrol 80mg  (J1040) Admin of Therapeutic Inj  intramuscular or subcutaneous (15176)  Problem # 4:  ESSENTIAL HYPERTENSION, BENIGN (ICD-401.1) Assessment: Unchanged  Problem # 5:  COMMON MIGRAINE (ICD-346.10) Assessment: Improved  Her updated medication list for this problem includes:    Adult Aspirin Low Strength 81 Mg Chew (Aspirin) .Marland Kitchen... Take 1 tablet by mouth once a day    Imitrex 6 Mg/0.30ml Soln (Sumatriptan succinate) ..... Injection  at  onset of headache     Zomig 5 Mg Tabs (Zolmitriptan) .Marland Kitchen... 1 by mouth once daily if needed for migraine .    Hydrocodone-acetaminophen 10-660 Mg Tabs (Hydrocodone-acetaminophen) ..... One q.4 h. p.r.n. for pain not over 4 per day  Complete Medication List: 1)  Adult Aspirin Low Strength 81 Mg Chew (Aspirin) .... Take 1 tablet by mouth once a day 2)  Imitrex 6 Mg/0.56ml Soln (Sumatriptan succinate) .... Injection  at onset of headache 3)  Zoloft 50 Mg Tabs (Sertraline hcl) .Marland Kitchen.. 1  tab qd 4)  Zomig 5 Mg Tabs (Zolmitriptan) .Marland Kitchen.. 1 by mouth once daily if needed for migraine . 5)  Adderall 20 Mg Tabs (Amphetamine-dextroamphetamine) .Marland Kitchen.. 1 by mouth three times a day 6)  Flexeril 10 Mg Tabs (Cyclobenzaprine hcl) .Marland Kitchen.. 1 tid 7)  Lyrica 50 Mg Caps (Pregabalin) .Marland Kitchen.. 1 morn , midafternoon and hs for numbness or neuropathy 8)  Hydrocodone-acetaminophen 10-660 Mg Tabs (Hydrocodone-acetaminophen) .... One q.4 h. p.r.n. for pain not over 4 per day 9)  Lodine 400 Mg  .... One b.i.d. p.c., prescribed by dr. Cleophas Dunker  Patient Instructions: 1)  1 tendinitis rtt achlles  tendon,  injected with lidocaine and depomdrol 40  mg 2)  peripheral neurpathy left lower leg from sciatica and inflammation left loer lumbosacral spine 3)  to give Depomedrol 160 mg IM for back , ankle rt  4)  Lyrica 50 mg three times a day for neuropathy 5)  restart flexeril 10 mg three times a dayfor muscle spasm or myosiis 6)  use hydrocodone as needed for pain, dont worry about becoming addicted 7)  Walk gently to protct  ankle 8)  getting in car sit down and put legs in Prescriptions: LYRICA 50 MG CAPS (PREGABALIN) 1 morn , midafternoon and hs for numbness or neuropathy  #90 x 5   Entered and Authorized by:   Judithann Sheen MD   Signed by:   Judithann Sheen MD on 12/19/2009   Method used:   Print then Give to Patient   RxID:   [id number]    Medication Administration  Injection # 1:    Medication: Depo- Medrol 80mg     Diagnosis: DISC  DISEASE, LUMBOSACRAL SPINE (ICD-722.52)    Route: IM    Site: RUOQ gluteus    Exp Date: 06/2012    Lot #: [id number]    Mfr: [employer]    Comments: 160 mg given --vo dr stafford     Patient tolerated injection without complications    Given by: Pura Spice, RN (Dec 19, 2009 2:57 PM)  Injection # 2:    Medication: Depo- Medrol 80mg     Diagnosis: DISC DISEASE, LUMBOSACRAL SPINE (ICD-722.52)    Route: IM    Site: RUOQ gluteus    Exp Date: 06/2012    Lot #: [id number]     Mfr: [employer]    Patient tolerated injection without complications    Given by: Pura Spice, RN (Dec 19, 2009 2:58 PM)  Orders Added: 1)  Depo- Medrol 80mg  [J1040] 2)  Admin of Therapeutic Inj  intramuscular or subcutaneous [96372] 3)  Est. Patient Level IV [72536] 4)  Injection, Tendon / Ligament [20550] 5)  Depo- Medrol 80mg  [J1040]

## 2010-09-11 NOTE — Progress Notes (Signed)
Summary: STATUS OF RX?  Phone Note Call from Patient   Caller: Spouse  [phone] Summary of Call: Pts husband called to ck on status of envelope (containing original Rx for 90-day supply and note) that pt dropped off at the office this morning around 9am - was addressed to Dr Scotty Court..... Pts husband states that due to the Adderall 20mg  shortage at local pharmacies he needs to have that script rewritten for pt -  Adderall 30mg  tablets - so he can obtain same through [facility] mail order..... Pt can be reached at [phone].  Initial call taken by: Debbra Riding,  May 31, 2010 4:10 PM  Follow-up for Phone Call        OK to write Adderal 30mg  by mouth two times a day 3 60, no rf Follow-up by: Danise Edge MD,  May 31, 2010 7:26 PM  Additional Follow-up for Phone Call Additional follow up Details #1::        Left message for pt to return my call. RX in front office. Additional Follow-up by: Josph Macho RMA,  June 01, 2010 9:10 AM    New/Updated Medications: ADDERALL 30 MG TABS (AMPHETAMINE-DEXTROAMPHETAMINE) 1 tab by mouth two times a day Prescriptions: ADDERALL 30 MG TABS (AMPHETAMINE-DEXTROAMPHETAMINE) 1 tab by mouth two times a day  #60 x 0   Entered by:   Josph Macho RMA   Authorized by:   Danise Edge MD   Signed by:   Josph Macho RMA on 06/01/2010   Method used:   Print then Give to Patient   RxID:   [id number]   Appended Document: STATUS OF RX? patient informed.

## 2010-09-11 NOTE — Progress Notes (Signed)
Summary: Pt relaseed from ER. Needs refill muscle relaxer or ov asap  Phone Note Call from Patient Call back at [facility] [phone]    Caller: spouse-Steve Summary of Call: Pt was released ER at [facility] on 12/08/09 for Sciatica. Pt was given muscle relaxer, Flexeril, and is almost out of med. Only 1 tab left. Needs refill asap. Pt is either needing refill of needs to see doctor asap.     Initial call taken by: Lucy Antigua,  Dec 13, 2009 2:07 PM  Follow-up for Phone Call        call in Flexeril 10 mg three times a day as needed spasm, #60 with 2 rf Follow-up by: Nelwyn Salisbury MD,  Dec 13, 2009 5:22 PM  Additional Follow-up for Phone Call Additional follow up Details #1::        [facility] Additional Follow-up by: Raechel Ache, RN,  Dec 13, 2009 5:27 PM
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
## 2010-09-11 NOTE — Progress Notes (Signed)
Summary: MRI results  Phone Note Call from Patient   Caller: Patient Call For: Judithann Sheen MD Summary of Call: Pt is calling for MRI results. [phone] Initial call taken by: Lynann Beaver CMA,  December 08, 2009 3:22 PM  Follow-up for Phone Call        called patient and she was on the phone and is to call me and return Follow-up by: Judithann Sheen MD,  December 08, 2009 3:50 PM  Additional Follow-up for Phone Call Additional follow up Details #1::        talk the patient later in day result and made plan of treatment

## 2010-09-11 NOTE — Letter (Signed)
Summary: Letter from patient requesting refills  Letter from patient requesting refills   Imported By: Maryln Gottron 04/11/2010 14:05:35  _____________________________________________________________________  External Attachment:    Type:   Image     Comment:   External Document

## 2010-09-11 NOTE — Assessment & Plan Note (Signed)
Summary: CONSULT RE: THYROID/CJR   Vital Signs:  Patient profile:   55 year old female Weight:      178 pounds O2 Sat:      98 % Temp:     98.3 degrees F Pulse rhythm:   regular BP sitting:   146 / 90  (left arm) Cuff size:   large  Vitals Entered By: Pura Spice, RN (February 22, 2010 3:51 PM) CC: Consult ? referral    History of Present Illness: this 55 year old white female was referred to Dr. Tylene Fantasia for treatment of her back as well as her right shoulder and in the process of doing an x-ray of the shoulder it appeared that she had she had a lung problem with COPD and ordered a CT scan of the chest appeared this did reveal some emphysema but the main abnormality was that are positive finding was that she had thyroid nodules, ultrasound revealed in the solid and several nodules in the thyroid., scan did reveal mild emphysema but no other pulmonary disease The patient is in today to discuss this with B. and decide on a plan for future evaluation and her pre- Patient relates no dullness or thyroid disease and we are unable to palpate the nodule her thyroid        Allergies: 1)  ! Codeine 2)  ! 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) ESSENTIAL HYPERTENSION, BENIGN (ICD-401.1) OTITIS MEDIA, ACUTE (ICD-382.9) DIARRHEA (ICD-787.91) IRRITABLE BOWEL SYNDROME (ICD-564.1) ABDOMINAL PAIN, LEFT LOWER QUADRANT (ICD-789.04) WEIGHT LOSS (ICD-783.21) ANXIETY DEPRESSION (ICD-300.4) INGROWN TOENAIL (ICD-703.0) INSOMNIA, CHRONIC (ICD-307.42) ATYPICAL FACE PAIN (ICD-350.2) OSTEOPENIA (ICD-733.90) ADD (ICD-314.00) COMMON MIGRAINE (ICD-346.10) lumbosacral disc disease  Risk Factors: Smoking Status: quit (12/19/2006)  Review of Systems      See HPI  The patient denies anorexia, fever, weight loss, weight gain, vision loss,  decreased hearing, hoarseness, chest pain, syncope, dyspnea on exertion, peripheral edema, prolonged cough, headaches, hemoptysis, abdominal pain, melena, hematochezia, severe indigestion/heartburn, hematuria, incontinence, genital sores, muscle weakness, suspicious skin lesions, transient blindness, difficulty walking, depression, unusual weight change, abnormal bleeding, enlarged lymph nodes, angioedema, breast masses, and testicular masses.    Physical Exam  General:  Well-developed,well-nourished,in no acute distress; alert,appropriate and cooperative throughout examination Neck:  No deformities, masses, or tenderness noted. Lungs:  Normal respiratory effort, chest expands symmetrically. Lungs are clear to auscultation, no crackles or wheezes. Heart:  Normal rate and regular rhythm. S1 and S2 normal without gallop, murmur, click, rub or other extra sounds.   Impression & Recommendations:  Problem # 1:  THYROID NODULE (ICD-241.0) multiple small solid nodules in both lobes and this will slow the thyroid which are non-palpable but found on CT scan and ultrasound instead of ordering thyroid test will refer patient to Dr. Cleon Gustin, an endocrinologist Orders: Endocrinology Referral (Endocrine)  Problem # 2:  NUMBNESS (ICD-782.0) Assessment: Unchanged  Problem # 3:  DISC DISEASE, LUMBOSACRAL SPINE (ICD-722.52) Assessment: Improved  Problem # 4:  ESSENTIAL HYPERTENSION, BENIGN (ICD-401.1) Assessment: Unchanged no treatment at this time  Complete Medication List: 1)  Adult Aspirin Low Strength 81 Mg Chew (Aspirin) .... Take 1 tablet by mouth once a day 2)  Imitrex 6 Mg/0.55ml Soln (Sumatriptan succinate) .... Injection  at onset of headache 3)  Zoloft 50 Mg Tabs (Sertraline hcl) .Marland Kitchen.. 1  tab qd 4)  Zomig 5 Mg Tabs (Zolmitriptan) .Marland Kitchen.. 1 by mouth once daily if needed for  migraine . 5)  Adderall 20 Mg Tabs (Amphetamine-dextroamphetamine) .Marland Kitchen.. 1 by mouth three times a day 6)  Flexeril 10 Mg Tabs  (Cyclobenzaprine hcl) .Marland Kitchen.. 1 tid 7)  Lyrica 50 Mg Caps (Pregabalin) .Marland Kitchen.. 1 morn , midafternoon and hs for numbness or neuropathy 8)  Hydrocodone-acetaminophen 10-660 Mg Tabs (Hydrocodone-acetaminophen) .... One q.4 h. p.r.n. for pain not over 4 per day 9)  Lodine 400 Mg  .... One b.i.d. p.c., prescribed by dr. Cleophas Dunker 10)  Nasonex 50 Mcg/act Susp (Mometasone furoate) .... 2 sprays each nostril daily  Patient Instructions: 1)  Posterior: One Dr. Cleon Gustin, and an endocrinologist to fully evaluate well for a possible acute 2)  We'll notify you of time in place over full  Appended Document: CONSULT RE: THYROID/CJR

## 2010-09-11 NOTE — Consult Note (Signed)
Summary: [facility]  [facility]   Imported By: Maryln Gottron 01/11/2010 13:43:23  _____________________________________________________________________  External Attachment:    Type:   Image     Comment:   External Document

## 2010-09-11 NOTE — Letter (Signed)
Summary: [facility]  [facility]   Imported By: Maryln Gottron 04/11/2010 13:24:15  _____________________________________________________________________  External Attachment:    Type:   Image     Comment:   External Document

## 2010-09-11 NOTE — Progress Notes (Signed)
Summary: rx adderall to pick up   Phone Note Call from Patient   Caller: Patient Summary of Call: requesting refill adderall 20 mg generic for 90 day supply  270 tablets [facility]  Initial call taken by: Pura Spice, RN,  April 05, 2010 11:53 AM  Follow-up for Phone Call        ok can pick up today  Follow-up by: Pura Spice, RN,  April 05, 2010 11:53 AM    Prescriptions: ADDERALL 20 MG TABS (AMPHETAMINE-DEXTROAMPHETAMINE) 1 by mouth three times a day  #270 x 0   Entered by:   Pura Spice, RN   Authorized by:   Judithann Sheen MD   Signed by:   Pura Spice, RN on 04/05/2010   Method used:   Print then Give to Patient   RxID:   [id number]

## 2010-09-11 NOTE — Progress Notes (Signed)
Summary: [facility] appt   ---- Converted from flag ---- ---- 12/29/2009 2:19 PM, Camelia Eng Slaugenhaupt wrote: FYI:   Pt changed her mind and called [facility], she set up appt: 5/25 @ 9a with Dr. Terrace Arabia.    Please let Dr. Scotty Court know.   Thanks!  ;) ------------------------------

## 2010-09-11 NOTE — Letter (Signed)
Summary: Letter from patient regarding Adderall Rx.  Letter from patient regarding Adderall Rx.   Imported By: Maryln Gottron 07/04/2010 09:54:07  _____________________________________________________________________  External Attachment:    Type:   Image     Comment:   External Document

## 2010-09-11 NOTE — Progress Notes (Signed)
Summary: Adderall refill  Phone Note Refill Request Message from:  Patient on June 29, 2010 2:56 PM  Refills Requested: Medication #1:  ADDERALL 30 MG TABS 1 tab by mouth two times a day   Dosage confirmed as above?Dosage Confirmed Pts spouse Brett Canales) dropped off a letter stating that pts local pharmacy has Adderall 30mg  in stock? Pt would like a 90 day supply sent to pharmacy. Please advise refill? [facility] number [phone]  Initial call taken by: Josph Macho RMA,  June 29, 2010 2:59 PM  Follow-up for Phone Call        OK to give 1 month supply Follow-up by: Danise Edge MD,  June 29, 2010 5:08 PM  Additional Follow-up for Phone Call Additional follow up Details #1::        rx up front ready for p/u, pt aware Additional Follow-up by: Alfred Levins, CMA,  July 02, 2010 2:57 PM    Prescriptions: ADDERALL 30 MG TABS (AMPHETAMINE-DEXTROAMPHETAMINE) 1 tab by mouth two times a day  #60 x 0   Entered and Authorized by:   Danise Edge MD   Signed by:   Danise Edge MD on 06/29/2010   Method used:   Print then Give to Patient   RxID:   [id number]

## 2010-09-11 NOTE — Assessment & Plan Note (Signed)
Summary: left body pain since monday//ccm   Vital Signs:  Patient profile:   55 year old female Pulse rate:   86 / minute BP sitting:   140 / 96  (left arm) Cuff size:   large  Vitals Entered By: Pura Spice, RN (December 07, 2009 1:30 PM) CC: woke up on Monday and onset Lhip pain with radiation and c/o foot numb. Saw Dr  Albertha Ghee (orthropedic) yest in which she states had XYR's and per pt "arthritis back" and was given rx by Dr Cleophas Dunker for Etodolac    Contraindications/Deferment of Procedures/Staging:    Test/Procedure: Weight Refused    Reason for deferment: patient declined-cannot calculate BMI   History of Present Illness: catheter 55 year-old White married female was doing fine until 4 days previously when she was awakened in the morning with pain in the left heel back with radiation of pain to left thigh knee and foot and gradually had numbness of the foot. She had an appointment and was seen by Dr. Albertha Ghee sports medicine physician with [facility]. An x-ray was taken and report was that she had arthritis in her back and was given etodolac 4oomg two times a day pain has not subsided and in fact has increased in severity. So patient came in for another opinion No other complaints on review of systems  Allergies: 1)  ! Codeine 2)  ! 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) ESSENTIAL HYPERTENSION, BENIGN (ICD-401.1) OTITIS MEDIA, ACUTE (ICD-382.9) DIARRHEA (ICD-787.91) IRRITABLE BOWEL SYNDROME (ICD-564.1) ABDOMINAL PAIN, LEFT LOWER QUADRANT (ICD-789.04) WEIGHT LOSS (ICD-783.21) ANXIETY DEPRESSION (ICD-300.4) INGROWN TOENAIL (ICD-703.0) INSOMNIA, CHRONIC (ICD-307.42) ATYPICAL FACE PAIN (ICD-350.2) OSTEOPENIA (ICD-733.90) ADD (ICD-314.00) COMMON MIGRAINE (ICD-346.10)  Past Surgical History: Last updated:  01/16/2008 Cataract extraction Hysterectomy (1996)  Risk Factors: Smoking Status: quit (12/19/2006)  Review of Systems      See HPI  The patient denies anorexia, fever, weight loss, weight gain, vision loss, decreased hearing, hoarseness, chest pain, syncope, dyspnea on exertion, peripheral edema, prolonged cough, headaches, hemoptysis, abdominal pain, melena, hematochezia, severe indigestion/heartburn, hematuria, incontinence, genital sores, muscle weakness, suspicious skin lesions, transient blindness, difficulty walking, depression, unusual weight change, abnormal bleeding, enlarged lymph nodes, angioedema, breast masses, and testicular masses.    Physical Exam  General:  Well-developed,well-nourished,in no acute distress; alert,appropriate and cooperative throughout examinationappear to be in severe pain when moving Lungs:  Normal respiratory effort, chest expands symmetrically. Lungs are clear to auscultation, no crackles or wheezes. Heart:  Normal rate and regular rhythm. S1 and S2 normal without gallop, murmur, click, rub or other extra sounds. Msk:  marked tenderness over the left SI joint straight leg raising to 45 on the left coughing produces pain reflexes are normal and equal bilaterally Pulses:  R and L carotid,radial,femoral,dorsalis pedis and posterior tibial pulses are full and equal bilaterally Extremities:  No clubbing, cyanosis, edema, or deformity noted with normal full range of motion of all joints.     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 Take  1 tablet by mouth three times a day 2)  Adult Aspirin Low Strength 81 Mg Chew (Aspirin) .... Take 1 tablet by mouth once a day 3)  Imitrex 6 Mg/0.60ml Soln (Sumatriptan succinate) .... Injection  at onset of headache 4)  Zoloft 50 Mg Tabs (Sertraline hcl) .Marland Kitchen.. 1  tab qd 5)  Zomig 5 Mg Tabs (Zolmitriptan) .Marland Kitchen.. 1 by mouth once daily if needed for migraine . 6)  Adderall 20 Mg Tabs (Amphetamine-dextroamphetamine) .Marland Kitchen.. 1 by mouth three times a day  Patient Instructions: 1)  it is highly suspicious that she have a herniated disc in the left lumbosacral spine and I will schedule an MRI as soon as possible either today or tomorrow to clarify the diagnosis 2)  Will prescribe hydrocodone APAP for pain 3)  Recommend bed rest and limited activity at this time

## 2010-09-11 NOTE — Progress Notes (Signed)
Summary: REFILL REQUEST  Phone Note Refill Request Message from:  Patient on April 27, 2010 12:00 PM  Refills Requested: Medication #1:  ADDERALL 20 MG TABS 1 by mouth three times a day   Notes: Pt can be reached at [phone] when Rx is ready for p/u.    Initial call taken by: Debbra Riding,  April 27, 2010 12:00 PM  Follow-up for Phone Call        RX is in front cabinet still from last month. Patient informed. Follow-up by: Josph Macho RMA,  April 27, 2010 1:54 PM

## 2010-09-11 NOTE — Letter (Signed)
Summary: No Show for Appt. with [facility]  No Show for Appt. with [facility]   Imported By: Maryln Gottron 11/17/2009 10:52:43  _____________________________________________________________________  External Attachment:    Type:   Image     Comment:   External Document

## 2010-09-11 NOTE — Letter (Signed)
Summary: [facility]   Imported By: Maryln Gottron 03/15/2010 13:12:15  _____________________________________________________________________  External Attachment:    Type:   Image     Comment:   External Document

## 2010-09-11 NOTE — Consult Note (Signed)
Summary: [facility]  [facility]   Imported By: Maryln Gottron 04/12/2010 10:10:21  _____________________________________________________________________  External Attachment:    Type:   Image     Comment:   External Document

## 2010-09-13 ENCOUNTER — Other Ambulatory Visit: Payer: Self-pay

## 2010-09-13 ENCOUNTER — Ambulatory Visit: Admit: 2010-09-13 | Payer: Self-pay | Admitting: Family Medicine

## 2010-09-13 NOTE — Assessment & Plan Note (Signed)
Summary: tick bite/dlo   Vital Signs:  Patient profile:   55 year old female Weight:      178 pounds Temp:     97.8 degrees F oral BP sitting:   118 / 82  (left arm)  Vitals Entered By: Doristine Devoid CMA (July 28, 2010 11:34 AM) CC: tick bite on L leg redness and tenderness    History of Present Illness: Patient seen with tick bite left leg pulled off yesterday. Husband described this as a deer tick. Patient reportedly had a history of Lyme disease several years ago when she lived in [place]. No headache or fever. Some mild erythema at site of bite.  No other rash.  Current Medications (verified): 1)  Adult Aspirin Low Strength 81 Mg Chew (Aspirin) .... Take 1 Tablet By Mouth Once A Day 2)  Imitrex 6 Mg/0.79ml  Soln (Sumatriptan Succinate) .... Injection  At Onset of Headache 3)  Zoloft 50 Mg Tabs (Sertraline Hcl) .Marland Kitchen.. 1  Tab Qd 4)  Zomig 5 Mg Tabs (Zolmitriptan) .Marland Kitchen.. 1 By Mouth Once Daily If Needed For Migraine . 5)  Adderall 30 Mg Tabs (Amphetamine-Dextroamphetamine) .Marland Kitchen.. 1 Tab By Mouth Two Times A Day 6)  Flexeril 10 Mg Tabs (Cyclobenzaprine Hcl) .Marland Kitchen.. 1 Tid 7)  Lyrica 50 Mg Caps (Pregabalin) .Marland Kitchen.. 1 Morn , Midafternoon and Hs For Numbness or Neuropathy 8)  Hydrocodone-Acetaminophen 10-660 Mg Tabs (Hydrocodone-Acetaminophen) .... One Q.4 H. P.r.n. For Pain Not Over 4 Per Day 9)  Lodine 400 Mg .... One B.i.d. P.c., Prescribed By Dr. Cleophas Dunker 10)  Nasonex 50 Mcg/act Susp (Mometasone Furoate) .... 2 Sprays Each Nostril Daily  Allergies (verified): 1)  ! Codeine 2)  ! 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    Physical Exam  General:  Well-developed,well-nourished,in no acute distress; alert,appropriate and cooperative throughout examination Lungs:  Normal respiratory effort, chest expands symmetrically. Lungs are clear to auscultation, no crackles or wheezes. Heart:  Normal rate and regular rhythm. S1 and S2 normal without gallop, murmur, click, rub or other extra sounds. Skin:  left leg reveals approximately one half centimeter area of erythema with punctate ecchymotic area near center. No visual residual tick   Impression & Recommendations:  Problem # 1:  TICK BITE (ICD-E906.4) no antibiotics at this time but if she is developing advancing rash start doxycycline 100 mg b.i.d. for 14 days  Complete Medication List: 1)  Adult Aspirin Low Strength 81 Mg Chew (Aspirin) .... Take 1 tablet by mouth once a day 2)  Imitrex 6 Mg/0.42ml Soln (Sumatriptan succinate) .... Injection  at onset of headache 3)  Zoloft 50 Mg Tabs (Sertraline hcl) .Marland Kitchen.. 1  tab qd 4)  Zomig 5 Mg Tabs (Zolmitriptan) .Marland Kitchen.. 1 by mouth once daily if needed for migraine . 5)  Adderall 30 Mg Tabs (Amphetamine-dextroamphetamine) .Marland Kitchen.. 1 tab by mouth two times a day 6)  Flexeril 10 Mg Tabs (Cyclobenzaprine hcl) .Marland Kitchen.. 1 tid 7)  Lyrica 50 Mg Caps (Pregabalin) .Marland Kitchen.. 1 morn , midafternoon and hs for numbness  or neuropathy 8)  Hydrocodone-acetaminophen 10-660 Mg Tabs (Hydrocodone-acetaminophen) .... One q.4 h. p.r.n. for pain not over 4 per day 9)  Lodine 400 Mg  .... One b.i.d. p.c., prescribed by dr. Cleophas Dunker 10)  Nasonex 50 Mcg/act Susp (Mometasone furoate) .... 2 sprays each nostril  daily 11)  Doxycycline Hyclate 100 Mg Caps (Doxycycline hyclate) .... One by mouth two times a day for 14 days  Patient Instructions: 1)  Start antibiotics if advancing rash typical of erythema chronicum migrans. Prescriptions: DOXYCYCLINE HYCLATE 100 MG CAPS (DOXYCYCLINE HYCLATE) one by mouth two times a day for 14 days  #28 x 0   Entered and Authorized by:   Evelena Peat MD   Signed by:   Evelena Peat MD on 07/28/2010   Method used:   Print then Give to Patient   RxID:   [id number]    Orders Added: 1)  Est. Patient Level III [14782]

## 2010-09-20 ENCOUNTER — Encounter: Payer: Self-pay | Admitting: Family Medicine

## 2010-10-09 ENCOUNTER — Other Ambulatory Visit (INDEPENDENT_AMBULATORY_CARE_PROVIDER_SITE_OTHER): Payer: BC Managed Care – PPO | Admitting: Family Medicine

## 2010-10-09 VITALS — BP 140/80 | HR 65 | Temp 98.6°F | Resp 15 | Ht 65.0 in | Wt 174.0 lb

## 2010-10-09 DIAGNOSIS — Z0000 Encounter for general adult medical examination without abnormal findings: Secondary | ICD-10-CM

## 2010-10-09 DIAGNOSIS — E785 Hyperlipidemia, unspecified: Secondary | ICD-10-CM

## 2010-10-09 LAB — HEPATIC FUNCTION PANEL
ALT: 16 U/L (ref 0–35)
Albumin: 4.4 g/dL (ref 3.5–5.2)
Alkaline Phosphatase: 43 U/L (ref 39–117)
Bilirubin, Direct: 0.1 mg/dL (ref 0.0–0.3)
Total Protein: 7.2 g/dL (ref 6.0–8.3)

## 2010-10-09 LAB — BASIC METABOLIC PANEL
CO2: 27 mEq/L (ref 19–32)
Chloride: 103 mEq/L (ref 96–112)
Potassium: 4.2 mEq/L (ref 3.5–5.1)
Sodium: 138 mEq/L (ref 135–145)

## 2010-10-09 LAB — POCT URINALYSIS DIPSTICK
Bilirubin, UA: NEGATIVE
Glucose, UA: NEGATIVE
Ketones, UA: NEGATIVE
Nitrite, UA: NEGATIVE
pH, UA: 5.5

## 2010-10-09 LAB — CBC WITH DIFFERENTIAL/PLATELET
Basophils Relative: 0.5 % (ref 0.0–3.0)
Eosinophils Absolute: 0.2 10*3/uL (ref 0.0–0.7)
Eosinophils Relative: 4.4 % (ref 0.0–5.0)
Hemoglobin: 14.3 g/dL (ref 12.0–15.0)
Lymphocytes Relative: 33.6 % (ref 12.0–46.0)
MCHC: 34.3 g/dL (ref 30.0–36.0)
MCV: 94.2 fl (ref 78.0–100.0)
Monocytes Absolute: 0.4 10*3/uL (ref 0.1–1.0)
Neutro Abs: 2.2 10*3/uL (ref 1.4–7.7)
RBC: 4.42 Mil/uL (ref 3.87–5.11)
WBC: 4.3 10*3/uL — ABNORMAL LOW (ref 4.5–10.5)

## 2010-10-09 LAB — TSH: TSH: 3.06 u[IU]/mL (ref 0.35–5.50)

## 2010-10-09 LAB — LIPID PANEL: HDL: 67.7 mg/dL (ref >39.00)

## 2010-10-16 ENCOUNTER — Ambulatory Visit (INDEPENDENT_AMBULATORY_CARE_PROVIDER_SITE_OTHER): Payer: BC Managed Care – PPO | Admitting: Family Medicine

## 2010-10-16 ENCOUNTER — Encounter: Payer: Self-pay | Admitting: Family Medicine

## 2010-10-16 VITALS — BP 140/80 | HR 100 | Temp 98.0°F | Ht 65.0 in | Wt 174.0 lb

## 2010-10-16 DIAGNOSIS — R197 Diarrhea, unspecified: Secondary | ICD-10-CM

## 2010-10-16 DIAGNOSIS — A6923 Arthritis due to Lyme disease: Secondary | ICD-10-CM

## 2010-10-16 DIAGNOSIS — M1990 Unspecified osteoarthritis, unspecified site: Secondary | ICD-10-CM

## 2010-10-16 DIAGNOSIS — M01X0 Direct infection of unspecified joint in infectious and parasitic diseases classified elsewhere: Secondary | ICD-10-CM

## 2010-10-16 DIAGNOSIS — G43909 Migraine, unspecified, not intractable, without status migrainosus: Secondary | ICD-10-CM

## 2010-10-16 DIAGNOSIS — F32A Depression, unspecified: Secondary | ICD-10-CM

## 2010-10-16 DIAGNOSIS — F909 Attention-deficit hyperactivity disorder, unspecified type: Secondary | ICD-10-CM

## 2010-10-16 DIAGNOSIS — Z0000 Encounter for general adult medical examination without abnormal findings: Secondary | ICD-10-CM

## 2010-10-16 DIAGNOSIS — A6920 Lyme disease, unspecified: Secondary | ICD-10-CM

## 2010-10-16 DIAGNOSIS — M129 Arthropathy, unspecified: Secondary | ICD-10-CM

## 2010-10-16 DIAGNOSIS — F329 Major depressive disorder, single episode, unspecified: Secondary | ICD-10-CM

## 2010-10-16 MED ORDER — ZOLMITRIPTAN 5 MG PO TABS: 5.0000 mg | ORAL_TABLET | ORAL | Status: DC | PRN

## 2010-10-16 MED ORDER — SERTRALINE HCL 50 MG PO TABS: 50.0000 mg | ORAL_TABLET | Freq: Every day | ORAL | Status: DC

## 2010-10-16 MED ORDER — SUMATRIPTAN SUCCINATE 6 MG/0.5ML ~~LOC~~ SOLN: 6.0000 mg | Freq: Once | SUBCUTANEOUS | Status: DC

## 2010-10-16 MED ORDER — CELECOXIB 200 MG PO CAPS: 200.0000 mg | ORAL_CAPSULE | Freq: Every day | ORAL | Status: AC

## 2010-10-16 MED ORDER — AMPHETAMINE-DEXTROAMPHETAMINE 30 MG PO TABS: 30.0000 mg | ORAL_TABLET | Freq: Two times a day (BID) | ORAL | Status: DC

## 2010-10-16 NOTE — Progress Notes (Signed)
  Subjective:    Patient ID: Dana Finley, female    DOB: 1956/07/25, 55 y.o.   MRN: [id number] This 55 year old white married female is in for preventative health maintenance examination and discuss her medical problems. She has arthritic pains and is concerned that she has Lyme's disease since he had a good bite and would like to have a Lyme test performed as thyroid problem and evaluated at [facility] 10 migraine headaches started 4 years to control now with Imitrex injections and Zomig Past history of peripheral neuropathy control with Lyrica Patient has ADD and takes Adderall 30 mg t.i.d. Concerned about weight gain HPI    Review of Systems see history of present illness     Objective:   Physical Exam the patient is a well-developed well-nourished pleasant slightly obese white female who is in no distress at this time HEENT negative examination including normal carotid pulses as well as thyroid normal size Breast are full no masses no lumps no tenderness axilla clear Heart no cardiomegaly heart sounds without murmurs peripheral pulses good and equal bilaterally Lungs are clear to palpation percussion  And auscultation  no rales no dullness no wheezing Abdomen liver spleen kidneys are nonpalpable no masses normal bowel sounds aorta normal in size Inguinal regions negative Back examination not done Extremities joints appear normal some tenderness over both the hip Neurological examination negative       Assessment & Plan:  Preventative health maintenance examination essentially normal Hypertension well controlled Arthritis persist and llyme l disease test to be carried out A DD control with Adderall

## 2010-10-17 MED ORDER — AMPHETAMINE-DEXTROAMPHETAMINE 30 MG PO TABS: 30.0000 mg | ORAL_TABLET | Freq: Two times a day (BID) | ORAL | Status: DC

## 2010-10-22 ENCOUNTER — Encounter: Payer: Self-pay | Admitting: Family Medicine

## 2010-11-29 ENCOUNTER — Telehealth: Payer: Self-pay | Admitting: Family Medicine

## 2010-11-29 NOTE — Telephone Encounter (Signed)
Please return call to [phone].

## 2010-12-11 NOTE — Telephone Encounter (Signed)
Called pt.

## 2010-12-25 NOTE — Discharge Summary (Signed)
Dana Finley                 ACCOUNT NO.:  [id number]   MEDICAL RECORD NO.:  [id number]          PATIENT TYPE:  INP   LOCATION:  1316                         FACILITY:  [facility]   PHYSICIAN:  Malcolm T. Russella Dar, MD, FACGDATE OF BIRTH:  March 10, 1956   DATE OF ADMISSION:  01/07/2007  DATE OF DISCHARGE:  01/09/2007                               DISCHARGE SUMMARY   ADMITTING DIAGNOSES:  49. A 55 year old white female with progressive lower abdominal pain x3      weeks unresponsive to oral Cipro and Flagyl, rule out refractory      diverticulitis.  2. Diarrhea for several months, etiology not clear, rule out irritable      bowel syndrome.  3. Status post hysterectomy.  4. Attention deficit disorder.  5. History of migraines.   DISCHARGE DIAGNOSES:  1. Persistent abdominal pain and diarrhea, symptomatically resolved      with antispasmodics and most consistent with irritable bowel      syndrome, diarrhea-predominant; no evidence for diverticulitis on      CT.  2. 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  3. Status post hysterectomy.  4. Attention deficit disorder.  5. History of migraines.   CONSULTATIONS:  None.  Procedures CT scan of the abdomen and pelvis.   BRIEF HISTORY:  Dana Finley is a pleasant 55 year old white female primary  patient of Dr. Scotty Court, known to Dr. Russella Dar.  She has history of  diverticular disease and hemorrhoids.  Last colonoscopy done April 2007  showed mild left-sided diverticular disease, internal and external  hemorrhoids, and she had one small polyp which was removed.  This time  she relates that she has been having loose stools with three to five  bowel movements per day over the past several months, really since her  colonoscopy.  Now over the past 3 weeks with ongoing lower abdominal  pain which had been progressive, located in the left lower quadrant,  fairly constant and radiating into her back.  It has been worse over the  past few days.  She has  had some nausea without vomiting and some low-  grade temperatures at home.  Stools remained loose.  She woke up at 2  a.m. on the day of admission with increased abdominal pain and diarrhea.  She had had a course of Cipro and Flagyl earlier this month with no  improvement in her symptoms.  She is admitted at this time for pain  control and further diagnostic evaluation with concern for refractory or  complicated diverticulitis.   LABORATORY STUDIES:  On admission WBC 7.4, hemoglobin 14.1, hematocrit  of 42.2, MCV of 92, platelets 277.  Sed rate of 9.  Pro time 12.8, INR  of 1.  Potassium 5.4, BUN 16, creatinine 0.64.  Liver function studies  were normal with the exception of an AST of 49, albumin 3.90.  UA was  negative.  Stool for C. difficile negative.  Stool for O&P negative.   X-ray studies:  CT scan of the abdomen and pelvis on Jan 07, 2007:  (1)  No acute abnormalities in the abdomen, nonspecific  gastric antral  thickening, probable cavernous hemangiomas involving the liver, largest  measuring 4.8 cm.  There was mild peripheral puddling of contrast on the  delayed images.  This could be confirmed with abdominal MR.  Lesion in  the right hepatic lobe measures 8 mm.  (2) Status post hysterectomy.   HOSPITAL COURSE:  The patient was admitted to the service of Dr. Victorino Dike and then cared for by Dr. Russella Dar who was covering the hospital.  She was placed on IV fluids, given morphine and Zofran as needed to  control her pain and nausea.  She was scheduled for CT of the abdomen  and pelvis with findings as outlined above.  CT scan was negative for  any evidence of diverticulitis or other acute abnormality to account for  her pain.  She was given a trial of Robinul Forte which helped her  immensely and essentially resolved her pain.  She did not require any  further IV medication thereafter.  Her stool cultures returned negative.  Her diet was advanced and she tolerated this without  difficulty.  She  was allowed discharge to home on Jan 09, 2007, with instructions to  follow up with Dr. Russella Dar in the office on June 18 at 11:15 a.m., to call  for any problems in the interim.  She was to follow a minimal lactose  diet.   New prescriptions on discharge:  1. Robinul Forte 2 mg daily to b.i.d. p.r.n.  2. Align one p.o. daily x30 days.   She was to continue her Protonix 40 mg daily as previous, and resume her  Adderall, Ambien, and Zomig as previous.   CONDITION ON DISCHARGE:  Stable and improved.      Dana Esterwood, PA-C      Malcolm T. Russella Dar, MD, [facility]  Electronically Signed    AE/MEDQ  D:  02/10/2007  T:  02/10/2007  Job:  [id number]   cc:   Ellin Saba., MD  [address] [place]  [place] [address]

## 2010-12-25 NOTE — H&P (Signed)
Dana Finley, Dana Finley                 ACCOUNT NO.:  [id number]   MEDICAL RECORD NO.:  [id number]          PATIENT TYPE:  INP   LOCATION:  1316                         FACILITY:  [facility]   PHYSICIAN:  Ulyess Mort, MD  DATE OF BIRTH:  1955/09/09   DATE OF ADMISSION:  01/07/2007  DATE OF DISCHARGE:                              HISTORY & PHYSICAL   CHIEF COMPLAINT:  Lower abdominal pain x3 weeks unresponsive to oral  antibiotics for diverticulitis and longer-term diarrhea.   HISTORY:  Dana Finley is a pleasant 55 year old white female known to Dr.  Russella Dar followed by Dr. Scotty Court with a history of hemorrhoids.  Dana Finley has  had prior colonoscopy in April 2007 at which time Dana Finley had mild left-  sided diverticular disease, internal and external hemorrhoids, and one  small polyp which was removed.   The patient relates that Dana Finley has been having loose stools with three to  five bowel movements per day over the past several months really since  her colonoscopy now over the past 3 weeks with ongoing lower abdominal  pain which has been somewhat progressive located in the left lower  quadrant it has been fairly constant and radiating into her back and  into the lower abdomen intermittently.  Dana Finley says it has been worse over  the past few days.  Dana Finley has also had some nausea without vomiting, low-  grade temperatures  at home.  Her stools remains loose.  Dana Finley woke up at  about 2 a.m. on the day of admission with abdominal discomfort and loose  stools.  Dana Finley had seen Dr. Scotty Court Dec 17, 2006.  Was treated with a  course of Cipro and Flagyl for probable diverticulitis but had no change  or improvement in her symptoms.   Dana Finley denies any dysuria, urgency, frequency, or hematuria.  Dana Finley has no  history of back disease.  No recent injury etc.  Dana Finley was seen and  evaluated in the office on the day of admission and felt to be acutely  ill and admitted for pain control and further diagnostic evaluation with  concern  for refractory diverticulitis.   MEDICATIONS:  1. Adderall 20 t.i.d.  2. Zomig p.r.n.  3. Ambien CR q.h.s.   ALLERGIES:  CODEINE WHICH CAUSES NAUSEA.   PAST HISTORY:  1. Dana Finley is status post hysterectomy.  2. Exploratory laparotomy remotely.  3. A history of colon polyps.  4. Internal and external hemorrhoids.  5. Migraines.  6. ADD.   FAMILY HISTORY:  A sister in her early 9s with colon cancer.   SOCIAL HISTORY:  The patient is married.  Dana Finley is not employed.  No  tobacco.  No EtOH.   REVIEW OF SYSTEMS:  CARDIOVASCULAR:  Negative.  PULMONARY:  Negative.  GU: Negative.  MUSCULOSKELETAL:  Negative.  NEURO:  Negative.  All other  review of systems negative other than outlined in HPI.   PHYSICAL EXAMINATION:  GENERAL:  A well-developed white female in no  acute distress.  Dana Finley is ill-appearing but  alert and oriented x3.  VITAL SIGNS:  Temperature is 98, blood pressure  120/70, pulse in the  70s.  HEENT:  Nontraumatic, normocephalic.  EOMI, PERRLA.  Sclerae are  anicteric.  Neck is supple without nodes.  Buccal mucosa somewhat dry.  CARDIOVASCULAR:  Regular rate and rhythm with S1 and S2.  No murmur,  rub, or gallop.  PULMONARY:  Clear to A and P.  ABDOMEN:  Soft.  Bowel sounds are active.  Dana Finley is mildly tender  diffusely and in the left lower quadrant suprapubic area.  There is no  mass or hepatosplenomegaly.  RECTAL:  Exam is not done at this time.  NEUROLOGIC:  Nonfocal.  EXTREMITIES:  Without clubbing, cyanosis, or edema.   IMPRESSION:  86. A 55 year old white female with progressive lower abdominal pain      over the past 3 weeks unresponsive to oral Cipro and Flagyl.      Etiology is not clear, rule out refractory diverticulitis.  2. Diarrhea times multiple months question irritable bowel syndrome.  3. Status post hysterectomy.  4. Attention deficit disorder.  5. A history of migraines.   PLAN:  The patient is admitted to the service of Dr. Victorino Dike for IV  fluid  hydration, baseline labs, stool cultures, CT of the abdomen and  pelvis.  We will cover her.  For details, please see the orders.      Mike Gip, PA-C      Ulyess Mort, MD  Electronically Signed    AE/MEDQ  D:  01/08/2007  T:  01/08/2007  Job:  [id number]

## 2010-12-28 NOTE — Assessment & Plan Note (Signed)
[facility]                           GASTROENTEROLOGY OFFICE NOTE   Dana, Finley                        MRN:          [id number]  DATE:02/27/2006                            DOB:          May 16, 1956    Dana Finley relates persistent problems with hematochezia.  She states for  approximately 2 weeks after her hemorrhoids were injected she had no rectal  bleeding and then gradually her symptoms have returned back to her baseline  of small amounts of bright red blood per rectum on occasion.  She states her  sister was recently diagnosed with colon cancer at age 15.  No other family  members with colon cancer or colon polyps.  She has no change in bowel  habits, abdominal pain or rectal pain.  Medications listed on the chart  obtained and reviewed.   MEDICATION ALLERGIES:  CODEINE.   PHYSICAL EXAMINATION:  In no acute distress.  Blood pressure 138/76, pulse  72 and regular.  She is not re-examined.   ASSESSMENT AND PLAN:  1.  Persistent hemorrhoidal bleeding.  Continue standard hemorrhoid and      rectal care instructions, begin Canasa 1000 mg suppositories q.h.s. for      2 weeks.  May repeat x2.  If this is not effective will consider other      topical measures and consider a flexible sigmoidoscopy with repeat      hemorrhoid injection.  Additional option of surgical management offered      to the patient, which she declines at this time.  She will contact us      with her progress and we will plan to see back in 6 months and as      needed.  2.  Colonoscopy recall April 2012, given her sister's history.                                   Venita Lick. Pleas Koch., MD, [facility]   MTS/MedQ  DD:  02/27/2006  DT:  02/27/2006  Job #:  [id number]

## 2011-02-12 ENCOUNTER — Encounter: Payer: Self-pay | Admitting: Family Medicine

## 2011-02-12 ENCOUNTER — Ambulatory Visit (INDEPENDENT_AMBULATORY_CARE_PROVIDER_SITE_OTHER): Payer: BC Managed Care – PPO | Admitting: Family Medicine

## 2011-02-12 DIAGNOSIS — R918 Other nonspecific abnormal finding of lung field: Secondary | ICD-10-CM

## 2011-02-12 DIAGNOSIS — F32A Depression, unspecified: Secondary | ICD-10-CM

## 2011-02-12 DIAGNOSIS — D649 Anemia, unspecified: Secondary | ICD-10-CM

## 2011-02-12 DIAGNOSIS — R5383 Other fatigue: Secondary | ICD-10-CM

## 2011-02-12 DIAGNOSIS — R5381 Other malaise: Secondary | ICD-10-CM

## 2011-02-12 DIAGNOSIS — M1990 Unspecified osteoarthritis, unspecified site: Secondary | ICD-10-CM

## 2011-02-12 DIAGNOSIS — F329 Major depressive disorder, single episode, unspecified: Secondary | ICD-10-CM

## 2011-02-12 DIAGNOSIS — M129 Arthropathy, unspecified: Secondary | ICD-10-CM

## 2011-02-12 MED ORDER — DICLOFENAC SODIUM 75 MG PO TBEC: 75.0000 mg | DELAYED_RELEASE_TABLET | Freq: Two times a day (BID) | ORAL | Status: AC

## 2011-02-12 MED ORDER — METHYLPREDNISOLONE ACETATE 80 MG/ML IJ SUSP
120.0000 mg | Freq: Once | INTRAMUSCULAR | Status: AC
  Administered 2011-02-12: 120 mg via INTRAMUSCULAR

## 2011-02-12 NOTE — Patient Instructions (Signed)
!  st approach to the teatment  Is to change to diclofenac 75mg  twice daily after meals Will get a battery of lab studies, and will call results on Thusday To check Dr. Joellyn Rued  note Do not exercise on treadmill nor walking exercises

## 2011-02-13 LAB — RHEUMATOID FACTOR: Rhuematoid fact SerPl-aCnc: 14 IU/mL (ref <=14)

## 2011-02-13 LAB — CBC WITH DIFFERENTIAL/PLATELET
Eosinophils Absolute: 0.2 10*3/uL (ref 0.0–0.7)
Lymphs Abs: 1.7 10*3/uL (ref 0.7–4.0)
MCH: 30.5 pg (ref 26.0–34.0)
Neutrophils Relative %: 58 % (ref 43–77)
Platelets: 298 10*3/uL (ref 150–400)
RBC: 4.29 MIL/uL (ref 3.87–5.11)
WBC: 5.6 10*3/uL (ref 4.0–10.5)

## 2011-02-13 LAB — VITAMIN B12: Vitamin B-12: 634 pg/mL (ref 211–911)

## 2011-02-15 ENCOUNTER — Telehealth: Payer: Self-pay | Admitting: *Deleted

## 2011-02-15 NOTE — Telephone Encounter (Signed)
Pt. Would like lab results, please.

## 2011-02-19 ENCOUNTER — Other Ambulatory Visit: Payer: Self-pay | Admitting: Family Medicine

## 2011-02-19 DIAGNOSIS — R9389 Abnormal findings on diagnostic imaging of other specified body structures: Secondary | ICD-10-CM

## 2011-02-19 DIAGNOSIS — R935 Abnormal findings on diagnostic imaging of other abdominal regions, including retroperitoneum: Secondary | ICD-10-CM

## 2011-02-19 NOTE — Progress Notes (Signed)
Quick Note:  Pt is aware. ______ 

## 2011-02-19 NOTE — Telephone Encounter (Signed)
Pt called and given lab results.  Pt is aware that ct for liver and kidneys will be scheduled.

## 2011-02-22 ENCOUNTER — Ambulatory Visit (INDEPENDENT_AMBULATORY_CARE_PROVIDER_SITE_OTHER)
Admission: RE | Admit: 2011-02-22 | Discharge: 2011-02-22 | Disposition: A | Discharge status: Home / Self Care | Payer: BC Managed Care – PPO | Source: Ambulatory Visit | Attending: Family Medicine | Admitting: Family Medicine

## 2011-02-22 ENCOUNTER — Ambulatory Visit
Admission: RE | Admit: 2011-02-22 | Discharge: 2011-02-22 | Disposition: A | Discharge status: Home / Self Care | Payer: BC Managed Care – PPO | Source: Ambulatory Visit | Attending: Family Medicine | Admitting: Family Medicine

## 2011-02-22 DIAGNOSIS — R9389 Abnormal findings on diagnostic imaging of other specified body structures: Secondary | ICD-10-CM

## 2011-02-22 DIAGNOSIS — R918 Other nonspecific abnormal finding of lung field: Secondary | ICD-10-CM

## 2011-02-22 DIAGNOSIS — R935 Abnormal findings on diagnostic imaging of other abdominal regions, including retroperitoneum: Secondary | ICD-10-CM

## 2011-02-22 MED ORDER — IOHEXOL 300 MG/ML  SOLN
100.0000 mL | Freq: Once | INTRAMUSCULAR | Status: AC | PRN
  Administered 2011-02-22: 100 mL via INTRAVENOUS

## 2011-03-01 NOTE — Progress Notes (Signed)
Pt is aware and picked up a copy of labs on 02/28/2011

## 2011-03-17 NOTE — Progress Notes (Signed)
  Subjective:    Patient ID: Dana Finley, female    DOB: 28-Oct-1955, 55 y.o.   MRN: [id number] This 55 year old white married female is in today complaining of pain of a pedicles bilaterally with some swelling. She has been seen by Dr. Darrelyn Hillock diagnosed arthritis the patient has been on etodalac Which has not helped and we will change treatment. She relates she is tired and fatigued more so than usual as well as continues to have migraine headaches is relieved with Imitrex 6 mg injections Depression is controlled rather well with Zoloft The patient had a chest CT scan one year ago and revealed tiny left upper lobe and left lower lobe nodule we are discussing repeating the CT scan HPI    Review of Systems no other symptoms on review of systems     Objective:   Physical Exam the patient is a well-built well-nourished white female who is pleasant cooperative and in no distress but complained of pain of her feet which also burned do to her neuropathy HEENT no positive findings Lungs clear to palpation percussion and auscultation no rales no dullness no wheezing Heart no cardiomegaly heart sounds are good regular rhythm peripheral pulses are good and equal bilaterally Extremities ankles were swollen tender and ankles and feet are both 1+ edema Decrease sensation to touch        Assessment & Plan:  Arthritis of the ankles to change treatment to diclofenac 75 mg twice a day also to do a rheumatoid factor Nodules left lung, 2 schedule CT can of chest Fatigue to get a battery of lab tests to evaluate Depression continue Zoloft Migraine headaches continue Imitrex 6 mg injection when needed Also may use Zomig

## 2011-05-14 ENCOUNTER — Telehealth: Payer: Self-pay

## 2011-05-14 DIAGNOSIS — F909 Attention-deficit hyperactivity disorder, unspecified type: Secondary | ICD-10-CM

## 2011-05-14 MED ORDER — AMPHETAMINE-DEXTROAMPHETAMINE 30 MG PO TABS: 30.0000 mg | ORAL_TABLET | Freq: Two times a day (BID) | ORAL | Status: DC

## 2011-05-14 NOTE — Telephone Encounter (Signed)
Ok per Dr. Stafford to fill.  

## 2011-05-30 LAB — COMPREHENSIVE METABOLIC PANEL
Alkaline Phosphatase: 48
BUN: 14
CO2: 29
Chloride: 105
GFR calc non Af Amer: >60
Glucose, Bld: 108 — ABNORMAL HIGH
Potassium: 3.9
Total Bilirubin: 0.7

## 2011-05-30 LAB — LIPASE, BLOOD: Lipase: 23

## 2011-05-30 LAB — CBC
HCT: 42.4
Hemoglobin: 14
MCV: 93.3
WBC: 6.9

## 2011-05-30 LAB — URINALYSIS, ROUTINE W REFLEX MICROSCOPIC
Bilirubin Urine: NEGATIVE
Glucose, UA: NEGATIVE
Ketones, ur: NEGATIVE
Nitrite: NEGATIVE
Protein, ur: NEGATIVE
pH: 6

## 2011-05-30 LAB — DIFFERENTIAL
Basophils Absolute: 0
Basophils Relative: 0
Monocytes Absolute: 0.4
Neutro Abs: 4.6
Neutrophils Relative %: 66

## 2011-05-30 LAB — URINE MICROSCOPIC-ADD ON

## 2011-07-10 DIAGNOSIS — E063 Autoimmune thyroiditis: Secondary | ICD-10-CM | POA: Insufficient documentation

## 2011-08-16 ENCOUNTER — Other Ambulatory Visit: Payer: Self-pay | Admitting: Family Medicine

## 2012-01-27 ENCOUNTER — Other Ambulatory Visit: Payer: Self-pay | Admitting: Family Medicine

## 2012-01-27 DIAGNOSIS — E063 Autoimmune thyroiditis: Secondary | ICD-10-CM

## 2012-01-29 ENCOUNTER — Ambulatory Visit
Admission: RE | Admit: 2012-01-29 | Discharge: 2012-01-29 | Disposition: A | Discharge status: Home / Self Care | Payer: BC Managed Care – PPO | Source: Ambulatory Visit | Attending: Family Medicine | Admitting: Family Medicine

## 2012-01-29 DIAGNOSIS — E063 Autoimmune thyroiditis: Secondary | ICD-10-CM

## 2012-05-11 ENCOUNTER — Encounter: Payer: Self-pay | Admitting: Gastroenterology

## 2012-06-12 ENCOUNTER — Ambulatory Visit (AMBULATORY_SURGERY_CENTER): Payer: BC Managed Care – PPO | Admitting: *Deleted

## 2012-06-12 VITALS — Ht 67.0 in | Wt 163.4 lb

## 2012-06-12 DIAGNOSIS — D126 Benign neoplasm of colon, unspecified: Secondary | ICD-10-CM

## 2012-06-12 DIAGNOSIS — Z800 Family history of malignant neoplasm of digestive organs: Secondary | ICD-10-CM

## 2012-06-12 DIAGNOSIS — Z1211 Encounter for screening for malignant neoplasm of colon: Secondary | ICD-10-CM

## 2012-06-12 HISTORY — DX: Benign neoplasm of colon, unspecified: D12.6

## 2012-06-12 MED ORDER — MOVIPREP 100 G PO SOLR: ORAL | Status: DC

## 2012-06-22 ENCOUNTER — Telehealth: Payer: Self-pay | Admitting: Gastroenterology

## 2012-06-22 NOTE — Telephone Encounter (Signed)
Patient advised that unable to add on to colon this week.  She is advised that I could discuss with Dr. Russella Dar and if he thought that an EGD was needed it would be after the first of the year.  She wanted to have them done at the same time.  She wants to wait for now.

## 2012-06-25 ENCOUNTER — Ambulatory Visit (AMBULATORY_SURGERY_CENTER): Payer: BC Managed Care – PPO | Admitting: Gastroenterology

## 2012-06-25 ENCOUNTER — Encounter: Payer: Self-pay | Admitting: Gastroenterology

## 2012-06-25 VITALS — BP 149/70 | HR 55 | Temp 98.4°F | Resp 24 | Ht 67.0 in | Wt 163.0 lb

## 2012-06-25 DIAGNOSIS — Z1211 Encounter for screening for malignant neoplasm of colon: Secondary | ICD-10-CM

## 2012-06-25 DIAGNOSIS — Z800 Family history of malignant neoplasm of digestive organs: Secondary | ICD-10-CM

## 2012-06-25 DIAGNOSIS — D126 Benign neoplasm of colon, unspecified: Secondary | ICD-10-CM

## 2012-06-25 MED ORDER — SODIUM CHLORIDE 0.9 % IV SOLN: 500.0000 mL | INTRAVENOUS | Status: DC

## 2012-06-25 NOTE — Op Note (Signed)
[facility] [address] [place], [address]   COLONOSCOPY PROCEDURE REPORT PATIENT: Dana Finley, Dana Finley  MR#: [id number] BIRTHDATE: 19-Aug-1955 , 56  yrs. old GENDER: Female ENDOSCOPIST: Meryl Dare, MD, [facility] PROCEDURE DATE:  06/25/2012 PROCEDURE:   Colonoscopy with snare polypectomy and Colonoscopy with biopsy ASA CLASS:   Class II INDICATIONS:patient's immediate family history of colon cancer. MEDICATIONS: MAC sedation, administered by CRNA and propofol (Diprivan) 340mg  IV DESCRIPTION OF PROCEDURE:   After the risks benefits and alternatives of the procedure were thoroughly explained, informed consent was obtained.  A digital rectal exam revealed no abnormalities of the rectum.   The LB CF-[id number]  endoscope was introduced through the anus and advanced to the cecum, which was identified by both the appendix and ileocecal valve. No adverse events experienced.   The quality of the prep was excellent, using MoviPrep  The instrument was then slowly withdrawn as the colon was fully examined.  COLON FINDINGS: A sessile polyp measuring 3 mm in size was found at the cecum.  A polypectomy was performed with cold forceps.  The resection was complete and the polyp tissue was completely retrieved.   Two sessile polyps measuring 3-7 mm in size were found in the transverse colon.  A polypectomy was performed using snare cautery for the 7 mm polyp and cold snare for the 3 mm polyp.  The resection was complete and the polyp tissue was completely retrieved.   A sessile polyp measuring 5 mm in size was found in the sigmoid colon.  A polypectomy was performed with a cold snare. The resection was complete and the polyp tissue was completely retrieved.   Mild diverticulosis was noted in the sigmoid colon. The colon was otherwise normal.  There was no diverticulosis, inflammation, polyps or cancers unless previously stated. Retroflexed views revealed no abnormalities. The  time to cecum=2 minutes 37 seconds.  Withdrawal time=11 minutes 15 seconds.  The scope was withdrawn and the procedure completed. COMPLICATIONS: There were no complications.  ENDOSCOPIC IMPRESSION: 1.   Sessile polyp at the cecum; polypectomy performed with cold forceps 2.  Two sessile polyps in the transverse colon; polypectomy performed using snare 3.   Sessile polyp in the sigmoid colon; polypectomy performed with a cold snare 4.   Mild diverticulosis was noted in the sigmoid colon  RECOMMENDATIONS: 1.  Await pathology results 2.  High fiber diet with liberal fluid intake. 3.  Repeat Colonoscopy in 5 years.  eSigned:  Meryl Dare, MD, [facility] 06/25/2012 11:27 AM   cc: Halina Maidens, MD

## 2012-06-25 NOTE — Progress Notes (Signed)
1128 a/ox3 pleased with MAC, report to [employer]

## 2012-06-25 NOTE — Progress Notes (Signed)
Patient did not experience any of the following events: a burn prior to discharge; a fall within the facility; wrong site/side/patient/procedure/implant event; or a hospital transfer or hospital admission upon discharge from the facility. (G8907) Patient did not have preoperative order for IV antibiotic SSI prophylaxis. (G8918)  

## 2012-06-25 NOTE — Patient Instructions (Signed)
Colon polyps x 4 removed today. Diverticulosis seen, try to follow high fiber diet with liberal fluid intake. See handouts. Resume current medications. Repeat colonoscopy in 5 years. Call us with any questions or concerns. Thank you!!   YOU HAD AN ENDOSCOPIC PROCEDURE TODAY AT THE [facility]: Refer to the procedure report that was given to you for any specific questions about what was found during the examination.  If the procedure report does not answer your questions, please call your gastroenterologist to clarify.  If you requested that your care partner not be given the details of your procedure findings, then the procedure report has been included in a sealed envelope for you to review at your convenience later.  YOU SHOULD EXPECT: Some feelings of bloating in the abdomen. Passage of more gas than usual.  Walking can help get rid of the air that was put into your GI tract during the procedure and reduce the bloating. If you had a lower endoscopy (such as a colonoscopy or flexible sigmoidoscopy) you may notice spotting of blood in your stool or on the toilet paper. If you underwent a bowel prep for your procedure, then you may not have a normal bowel movement for a few days.  DIET: Your first meal following the procedure should be a light meal and then it is ok to progress to your normal diet.  A half-sandwich or bowl of soup is an example of a good first meal.  Heavy or fried foods are harder to digest and may make you feel nauseous or bloated.  Likewise meals heavy in dairy and vegetables can cause extra gas to form and this can also increase the bloating.  Drink plenty of fluids but you should avoid alcoholic beverages for 24 hours.  ACTIVITY: Your care partner should take you home directly after the procedure.  You should plan to take it easy, moving slowly for the rest of the day.  You can resume normal activity the day after the procedure however you should NOT DRIVE or use heavy  machinery for 24 hours (because of the sedation medicines used during the test).    SYMPTOMS TO REPORT IMMEDIATELY: A gastroenterologist can be reached at any hour.  During normal business hours, 8:30 AM to 5:00 PM Monday through Friday, call [phone].  After hours and on weekends, please call the GI answering service at [phone] who will take a message and have the physician on call contact you.   Following lower endoscopy (colonoscopy or flexible sigmoidoscopy):  Excessive amounts of blood in the stool  Significant tenderness or worsening of abdominal pains  Swelling of the abdomen that is new, acute  Fever of 100F or higher  FOLLOW UP: If any biopsies were taken you will be contacted by phone or by letter within the next 1-3 weeks.  Call your gastroenterologist if you have not heard about the biopsies in 3 weeks.  Our staff will call the home number listed on your records the next business day following your procedure to check on you and address any questions or concerns that you may have at that time regarding the information given to you following your procedure. This is a courtesy call and so if there is no answer at the home number and we have not heard from you through the emergency physician on call, we will assume that you have returned to your regular daily activities without incident.  SIGNATURES/CONFIDENTIALITY: You and/or your care partner have signed paperwork which  will be entered into your electronic medical record.  These signatures attest to the fact that that the information above on your After Visit Summary has been reviewed and is understood.  Full responsibility of the confidentiality of this discharge information lies with you and/or your care-partner.

## 2012-06-26 ENCOUNTER — Telehealth: Payer: Self-pay | Admitting: *Deleted

## 2012-06-26 NOTE — Telephone Encounter (Signed)
  Follow up Call-  Call back number 06/25/2012  Post procedure Call Back phone  # [phone]  Permission to leave phone message Yes     Patient questions:  Do you have a fever, pain , or abdominal swelling? no Pain Score  0 *  Have you tolerated food without any problems? yes  Have you been able to return to your normal activities? yes  Do you have any questions about your discharge instructions: Diet   no Medications  no Follow up visit  no  Do you have questions or concerns about your Care? no  Actions: * If pain score is 4 or above: No action needed, pain <4.

## 2012-06-30 ENCOUNTER — Encounter: Payer: Self-pay | Admitting: Gastroenterology

## 2012-07-06 ENCOUNTER — Encounter: Payer: Self-pay | Admitting: Gastroenterology

## 2012-08-14 ENCOUNTER — Ambulatory Visit (INDEPENDENT_AMBULATORY_CARE_PROVIDER_SITE_OTHER): Payer: BC Managed Care – PPO | Admitting: Gastroenterology

## 2012-08-14 ENCOUNTER — Encounter: Payer: Self-pay | Admitting: Gastroenterology

## 2012-08-14 VITALS — BP 102/62 | HR 80 | Ht 64.0 in | Wt 168.5 lb

## 2012-08-14 DIAGNOSIS — Z86010 Personal history of colonic polyps: Secondary | ICD-10-CM

## 2012-08-14 DIAGNOSIS — R109 Unspecified abdominal pain: Secondary | ICD-10-CM

## 2012-08-14 DIAGNOSIS — R197 Diarrhea, unspecified: Secondary | ICD-10-CM

## 2012-08-14 MED ORDER — HYOSCYAMINE SULFATE 0.125 MG SL SUBL: SUBLINGUAL_TABLET | SUBLINGUAL | Status: DC

## 2012-08-14 NOTE — Patient Instructions (Addendum)
You have been scheduled for an endoscopy with propofol. Please follow written instructions given to you at your visit today. If you use inhalers (even only as needed) or a CPAP machine, please bring them with you on the day of your procedure.  We have sent the following medications to your pharmacy for you to pick up at your convenience: Hyoscyamine.  [facility] will contact you with an appointment date and time of your Lactose testing.  cc: Warrick Parisian, MD

## 2012-08-14 NOTE — Progress Notes (Signed)
History of Present Illness: This is a 57 year old female here today with her husband. She has long-term problems with loose stools and abdominal pain. She was diagnosed with irritable bowel syndrome several years ago and her symptoms seemed to improve with the as needed use of hyoscyamine. She has recently decreased her lactose intake and notes a slight improvement in her diarrhea. She was concerned about celiac disease however she had antibody testing in 2012 that was negative. She has occasional postprandial pain, epigastric and lower abdominal pain, intermittently. Her symptoms have not worsened in severity. She underwent colonoscopy in November 2013 showing one small adenomatous colon polyp. Denies weight loss, constipation,  change in stool caliber, melena, hematochezia, nausea, vomiting, dysphagia, reflux symptoms, chest pain.  Current Medications, Allergies, Past Medical History, Past Surgical History, Family History and Social History were reviewed in [employer] record.  Physical Exam: General: Well developed , well nourished, no acute distress Head: Normocephalic and atraumatic Eyes:  sclerae anicteric, EOMI Ears: Normal auditory acuity Mouth: No deformity or lesions Lungs: Clear throughout to auscultation Heart: Regular rate and rhythm; no murmurs, rubs or bruits Abdomen: Soft, mild epigastric tenderness without rebound or guarding and non distended. No masses, hepatosplenomegaly or hernias noted. Normal Bowel sounds Rectal: Deferred given recent DRE at colonoscopy Musculoskeletal: Symmetrical with no gross deformities  Pulses:  Normal pulses noted Extremities: No clubbing, cyanosis, edema or deformities noted Neurological: Alert oriented x 4, grossly nonfocal Psychological:  Alert and cooperative. Normal mood and affect   Assessment and Recommendations:  1. Diarrhea and abdominal pain. Presumed irritable bowel syndrome. She may have a component of lactose  intolerance. She would like to have definitive testing for lactose intolerance and we will arrange. Resume hyoscyamine as needed. Schedule upper endoscopy given epigastric pain and tenderness to rule out gastritis, ulcer, gerd. The risks, benefits, and alternatives to endoscopy with possible biopsy and possible dilation were discussed with the patient and they consent to proceed.   2. Personal history of adenomatous colon polyp and family history of colon cancer. Surveillance colonoscopy November 2018.

## 2012-08-17 ENCOUNTER — Encounter: Payer: Self-pay | Admitting: Gastroenterology

## 2012-08-25 ENCOUNTER — Ambulatory Visit (AMBULATORY_SURGERY_CENTER): Payer: BC Managed Care – PPO | Admitting: Gastroenterology

## 2012-08-25 ENCOUNTER — Encounter: Payer: Self-pay | Admitting: Gastroenterology

## 2012-08-25 VITALS — BP 131/71 | HR 53 | Temp 97.7°F | Resp 36 | Ht 64.0 in | Wt 168.0 lb

## 2012-08-25 DIAGNOSIS — K2970 Gastritis, unspecified, without bleeding: Secondary | ICD-10-CM

## 2012-08-25 DIAGNOSIS — R1013 Epigastric pain: Secondary | ICD-10-CM

## 2012-08-25 DIAGNOSIS — R109 Unspecified abdominal pain: Secondary | ICD-10-CM

## 2012-08-25 DIAGNOSIS — K2960 Other gastritis without bleeding: Secondary | ICD-10-CM

## 2012-08-25 MED ORDER — SODIUM CHLORIDE 0.9 % IV SOLN: 500.0000 mL | INTRAVENOUS | Status: DC

## 2012-08-25 NOTE — Progress Notes (Signed)
No egg or soy allergy. ewm 

## 2012-08-25 NOTE — Patient Instructions (Addendum)
Handout was given to your care partner on gastritis.  Biopsies were taken.  You may resume your current medications today.  Please call if any questions or concerns.    YOU HAD AN ENDOSCOPIC PROCEDURE TODAY AT THE [facility]: Refer to the procedure report that was given to you for any specific questions about what was found during the examination.  If the procedure report does not answer your questions, please call your gastroenterologist to clarify.  If you requested that your care partner not be given the details of your procedure findings, then the procedure report has been included in a sealed envelope for you to review at your convenience later.  YOU SHOULD EXPECT: Some feelings of bloating in the abdomen. Passage of more gas than usual.  Walking can help get rid of the air that was put into your GI tract during the procedure and reduce the bloating. If you had a lower endoscopy (such as a colonoscopy or flexible sigmoidoscopy) you may notice spotting of blood in your stool or on the toilet paper. If you underwent a bowel prep for your procedure, then you may not have a normal bowel movement for a few days.  DIET: Your first meal following the procedure should be a light meal and then it is ok to progress to your normal diet.  A half-sandwich or bowl of soup is an example of a good first meal.  Heavy or fried foods are harder to digest and may make you feel nauseous or bloated.  Likewise meals heavy in dairy and vegetables can cause extra gas to form and this can also increase the bloating.  Drink plenty of fluids but you should avoid alcoholic beverages for 24 hours.  ACTIVITY: Your care partner should take you home directly after the procedure.  You should plan to take it easy, moving slowly for the rest of the day.  You can resume normal activity the day after the procedure however you should NOT DRIVE or use heavy machinery for 24 hours (because of the sedation medicines used during  the test).    SYMPTOMS TO REPORT IMMEDIATELY: A gastroenterologist can be reached at any hour.  During normal business hours, 8:30 AM to 5:00 PM Monday through Friday, call [phone].  After hours and on weekends, please call the GI answering service at [phone] who will take a message and have the physician on call contact you.     Following upper endoscopy (EGD)  Vomiting of blood or coffee ground material  New chest pain or pain under the shoulder blades  Painful or persistently difficult swallowing  New shortness of breath  Fever of 100F or higher  Black, tarry-looking stools  FOLLOW UP: If any biopsies were taken you will be contacted by phone or by letter within the next 1-3 weeks.  Call your gastroenterologist if you have not heard about the biopsies in 3 weeks.  Our staff will call the home number listed on your records the next business day following your procedure to check on you and address any questions or concerns that you may have at that time regarding the information given to you following your procedure. This is a courtesy call and so if there is no answer at the home number and we have not heard from you through the emergency physician on call, we will assume that you have returned to your regular daily activities without incident.  SIGNATURES/CONFIDENTIALITY: You and/or your care partner have signed paperwork which  will be entered into your electronic medical record.  These signatures attest to the fact that that the information above on your After Visit Summary has been reviewed and is understood.  Full responsibility of the confidentiality of this discharge information lies with you and/or your care-partner.

## 2012-08-25 NOTE — Progress Notes (Signed)
Called to room to assist during endoscopic procedure.  Patient ID and intended procedure confirmed with present staff. Received instructions for my participation in the procedure from the performing physician.  

## 2012-08-25 NOTE — Op Note (Signed)
[facility] [address] [place], [address]   ENDOSCOPY PROCEDURE REPORT  PATIENT: Dana, Finley  MR#: [id number] BIRTHDATE: 01/21/1956 , 56  yrs. old GENDER: Female ENDOSCOPIST: Meryl Dare, MD, [facility] PROCEDURE DATE:  08/25/2012 PROCEDURE:  EGD w/ biopsy ASA CLASS:     Class II INDICATIONS:  Epigastric pain. MEDICATIONS: MAC sedation, administered by CRNA and propofol (Diprivan) 200mg  IV TOPICAL ANESTHETIC: Cetacaine Spray DESCRIPTION OF PROCEDURE: After the risks benefits and alternatives of the procedure were thoroughly explained, informed consent was obtained.  The LB-GIF Q180 [id number] endoscope was introduced through the mouth and advanced to the second portion of the duodenum. Without limitations.  The instrument was slowly withdrawn as the mucosa was fully examined.  STOMACH: Mild gastritis (inflammation) was found in the gastric body and gastric fundus.  It was erythematous and granular. Multiple biopsies were performed using cold forceps.   The stomach otherwise appeared normal. ESOPHAGUS: The mucosa of the esophagus appeared normal. DUODENUM: The duodenal mucosa showed no abnormalities in the bulb and second portion of the duodenum.  Retroflexed views revealed no abnormalities.   The scope was then withdrawn from the patient and the procedure completed.  COMPLICATIONS: There were no complications.  ENDOSCOPIC IMPRESSION: 1.   Gastritis (inflammation) in the gastric body and gastric fundus; multiple biopsies  RECOMMENDATIONS: 1. Await pathology results    eSigned:  Meryl Dare, MD, [facility] 08/25/2012 10:36 AM   XB:[id number] Buckner Malta, MD

## 2012-08-25 NOTE — Progress Notes (Signed)
No complaints noted in the recovery room. Maw  Patient did not experience any of the following events: a burn prior to discharge; a fall within the facility; wrong site/side/patient/procedure/implant event; or a hospital transfer or hospital admission upon discharge from the facility. (G8907) Patient did not have preoperative order for IV antibiotic SSI prophylaxis. (G8918)  

## 2012-08-26 ENCOUNTER — Telehealth: Payer: Self-pay | Admitting: *Deleted

## 2012-08-26 NOTE — Telephone Encounter (Signed)
  Follow up Call-  Call back number 08/25/2012 06/25/2012  Post procedure Call Back phone  # [phone]  Permission to leave phone message Yes Yes     Patient questions:  Do you have a fever, pain , or abdominal swelling? no Pain Score  0 *  Have you tolerated food without any problems? yes  Have you been able to return to your normal activities? yes  Do you have any questions about your discharge instructions: Diet   no Medications  no Follow up visit  no  Do you have questions or concerns about your Care? no  Actions: * If pain score is 4 or above: No action needed, pain <4.

## 2012-08-31 ENCOUNTER — Encounter: Payer: Self-pay | Admitting: Gastroenterology

## 2012-09-14 ENCOUNTER — Telehealth: Payer: Self-pay | Admitting: Gastroenterology

## 2012-09-14 MED ORDER — DICYCLOMINE HCL 10 MG PO CAPS: 10.0000 mg | ORAL_CAPSULE | Freq: Four times a day (QID) | ORAL | Status: DC | PRN

## 2012-09-14 NOTE — Telephone Encounter (Signed)
I explained to the patient that the bx was negative for h. Pylori and she does not need antibiotics.  She is requesting an alternated antispasmodic.  Please advise

## 2012-09-14 NOTE — Telephone Encounter (Signed)
Hyoscyamine is not effective for her.  I have left a detailed message for the patient with Dr. Russella Dar recommendations

## 2012-09-14 NOTE — Telephone Encounter (Signed)
Try Bentyl 10 mg qid prn Please check why she wants another antispasmotic-cost, efficacy, side effect?

## 2012-10-01 ENCOUNTER — Encounter: Payer: Self-pay | Admitting: Gastroenterology

## 2012-10-08 DIAGNOSIS — H02889 Meibomian gland dysfunction of unspecified eye, unspecified eyelid: Secondary | ICD-10-CM | POA: Insufficient documentation

## 2012-10-08 DIAGNOSIS — H0012 Chalazion right lower eyelid: Secondary | ICD-10-CM

## 2012-10-08 HISTORY — DX: Chalazion right lower eyelid: H00.12

## 2012-10-13 ENCOUNTER — Telehealth: Payer: Self-pay | Admitting: Gastroenterology

## 2012-10-13 NOTE — Telephone Encounter (Signed)
See last office note-treat for lastose intolerance and IBS Lactose avoidance and/or Lactaid use long term Levsin ac and hs as needed long term

## 2012-10-13 NOTE — Telephone Encounter (Signed)
Left message for patient to call back  

## 2012-10-13 NOTE — Telephone Encounter (Signed)
Patient aware of the results and Dr. Ardell Isaacs recommendations

## 2012-10-13 NOTE — Telephone Encounter (Signed)
I have printed the results from care everywhere and it does confirm Lactaose intolerance.  Dr. Russella Dar beside a lactose free diet, she would like to know your recommendations

## 2013-02-02 ENCOUNTER — Other Ambulatory Visit: Payer: Self-pay | Admitting: Family Medicine

## 2013-02-02 DIAGNOSIS — Z1231 Encounter for screening mammogram for malignant neoplasm of breast: Secondary | ICD-10-CM

## 2013-02-09 ENCOUNTER — Ambulatory Visit
Admission: RE | Admit: 2013-02-09 | Discharge: 2013-02-09 | Disposition: A | Discharge status: Home / Self Care | Payer: BC Managed Care – PPO | Source: Ambulatory Visit | Attending: Family Medicine | Admitting: Family Medicine

## 2013-02-09 DIAGNOSIS — Z1231 Encounter for screening mammogram for malignant neoplasm of breast: Secondary | ICD-10-CM

## 2013-02-11 ENCOUNTER — Ambulatory Visit: Payer: BC Managed Care – PPO

## 2013-07-07 ENCOUNTER — Telehealth: Payer: Self-pay | Admitting: Gastroenterology

## 2013-07-07 NOTE — Telephone Encounter (Signed)
Patient advised she is scheduled for 07/22/13 9:45

## 2013-07-07 NOTE — Telephone Encounter (Signed)
Last office visit in 08/2012. She should schedule REV with me or APP soon to evaluate her.

## 2013-07-07 NOTE — Telephone Encounter (Signed)
I have explained to the patient that she was tested for celiac in 2012.  Results are in EPIC.  She wants to know if she should be tested again.  She c/o persistent diarrhea.  She also wants to know if there are any other tests to check why she has diarrhea.

## 2013-07-22 ENCOUNTER — Ambulatory Visit (INDEPENDENT_AMBULATORY_CARE_PROVIDER_SITE_OTHER): Payer: BC Managed Care – PPO | Admitting: Gastroenterology

## 2013-07-22 ENCOUNTER — Telehealth: Payer: Self-pay | Admitting: Gastroenterology

## 2013-07-22 ENCOUNTER — Other Ambulatory Visit (INDEPENDENT_AMBULATORY_CARE_PROVIDER_SITE_OTHER): Payer: BC Managed Care – PPO

## 2013-07-22 ENCOUNTER — Encounter: Payer: Self-pay | Admitting: Gastroenterology

## 2013-07-22 VITALS — BP 120/80 | HR 84 | Ht 64.5 in | Wt 165.4 lb

## 2013-07-22 DIAGNOSIS — R197 Diarrhea, unspecified: Secondary | ICD-10-CM

## 2013-07-22 DIAGNOSIS — R1084 Generalized abdominal pain: Secondary | ICD-10-CM

## 2013-07-22 LAB — TSH: TSH: 0.92 u[IU]/mL (ref 0.35–5.50)

## 2013-07-22 MED ORDER — GLYCOPYRROLATE 1 MG PO TABS: 1.0000 mg | ORAL_TABLET | Freq: Two times a day (BID) | ORAL | Status: DC

## 2013-07-22 NOTE — Telephone Encounter (Signed)
Questions answered.

## 2013-07-22 NOTE — Patient Instructions (Addendum)
Your physician has requested that you go to the basement for the following lab work before leaving today:TSH, Celiac panel, Stool Cultures, GI pathogen panel and pancreatic fecal elastase.   We have sent the following medications to your pharmacy for you to pick up at your convenience: Robinul.  You have been given a [place] diet.   Thank you for choosing me and [facility].  Venita Lick. Pleas Koch., MD., Clementeen Graham  cc: Warrick Parisian, MD

## 2013-07-22 NOTE — Telephone Encounter (Signed)
Left message for patient to call back  

## 2013-07-22 NOTE — Progress Notes (Signed)
    History of Present Illness: This is a 57 year old female who has had problems with loose, watery, nonbloody diarrhea with urgent bowel movements for years. Her symptoms are frequently postprandial and they can bother her at other times. She has abdominal pain gas and bloating as well. She has been treated for diarrhea predominant irritable bowel syndrome previously. Hyoscyamine was previously prescribed however she discontinued it. She underwent colonoscopy in November 2013. Celiac antibody testing was performed at 2012. Her appetite is good and her weight is stable. Denies weight loss, constipation, change in stool caliber, melena, hematochezia, nausea, vomiting, dysphagia, reflux symptoms, chest pain.  Current Medications, Allergies, Past Medical History, Past Surgical History, Family History and Social History were reviewed in [employer] record.  Physical Exam: General: Well developed , well nourished, no acute distress Head: Normocephalic and atraumatic Eyes:  sclerae anicteric, EOMI Ears: Normal auditory acuity Mouth: No deformity or lesions Lungs: Clear throughout to auscultation Heart: Regular rate and rhythm; no murmurs, rubs or bruits Abdomen: Soft, mild upper abdominal tenderness to deep palpation and non distended. No masses, hepatosplenomegaly or hernias noted. Normal Bowel sounds Musculoskeletal: Symmetrical with no gross deformities  Pulses:  Normal pulses noted Extremities: No clubbing, cyanosis, edema or deformities noted Neurological: Alert oriented x 4, grossly nonfocal Psychological:  Alert and cooperative. Normal mood and affect  Assessment and Recommendations:  1. Chronic diarrhea associated with abdominal pain, gas bloating and urgency. 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 Reevaluation for other etiologies including infectious diarrhea, hyperthyroidism, celiac disease and pancreatic insufficiency. Begin FODMAP diet  and glycopyrrolate 1 mg twice a day. Return office visit in 6 weeks.

## 2013-07-23 LAB — CELIAC PANEL 10: IgA: 229 mg/dL (ref 69–380)

## 2013-08-19 ENCOUNTER — Other Ambulatory Visit: Payer: BC Managed Care – PPO

## 2013-08-19 DIAGNOSIS — R197 Diarrhea, unspecified: Secondary | ICD-10-CM

## 2013-08-19 DIAGNOSIS — R1084 Generalized abdominal pain: Secondary | ICD-10-CM

## 2013-08-24 LAB — PANCREATIC ELASTASE, FECAL

## 2013-09-06 ENCOUNTER — Encounter: Payer: Self-pay | Admitting: Gastroenterology

## 2013-09-06 ENCOUNTER — Ambulatory Visit (INDEPENDENT_AMBULATORY_CARE_PROVIDER_SITE_OTHER): Payer: BC Managed Care – PPO | Admitting: Gastroenterology

## 2013-09-06 VITALS — BP 110/60 | HR 76 | Ht 64.5 in | Wt 163.2 lb

## 2013-09-06 DIAGNOSIS — E739 Lactose intolerance, unspecified: Secondary | ICD-10-CM

## 2013-09-06 DIAGNOSIS — K589 Irritable bowel syndrome without diarrhea: Secondary | ICD-10-CM

## 2013-09-06 DIAGNOSIS — R197 Diarrhea, unspecified: Secondary | ICD-10-CM

## 2013-09-06 NOTE — Progress Notes (Signed)
    History of Present Illness: This is a 58 year old female with recurrent diarrhea. Recent evaluation was unremarkable including pancreatic last piece, GI pathogen panel and a celiac panel. She has strictly avoided coffee and lactose products and her diarrhea has substantially improved.  Current Medications, Allergies, Past Medical History, Past Surgical History, Family History and Social History were reviewed in [employer] record.  Physical Exam: General: Well developed , well nourished, no acute distress Head: Normocephalic and atraumatic Eyes:  sclerae anicteric, EOMI Ears: Normal auditory acuity Mouth: No deformity or lesions Lungs: Clear throughout to auscultation Heart: Regular rate and rhythm; no murmurs, rubs or bruits Abdomen: Soft, non tender and non distended. No masses, hepatosplenomegaly or hernias noted. Normal Bowel sounds Musculoskeletal: Symmetrical with no gross deformities  Pulses:  Normal pulses noted Extremities: No clubbing, cyanosis, edema or deformities noted Neurological: Alert oriented x 4, grossly nonfocal Psychological:  Alert and cooperative. Normal mood and affect  Assessment and Recommendations:  1. Diarrhea, presumed IBS. 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 She may use Imodium and antispasmodics as needed. Followup when necessary.

## 2013-09-06 NOTE — Patient Instructions (Signed)
Follow up as needed.   Thank you for choosing me and [facility].  Malcolm T. Stark, Jr., MD., FACG  

## 2014-08-16 ENCOUNTER — Other Ambulatory Visit: Payer: Self-pay | Admitting: Dermatology

## 2014-08-31 DIAGNOSIS — Z800 Family history of malignant neoplasm of digestive organs: Secondary | ICD-10-CM | POA: Insufficient documentation

## 2015-06-19 ENCOUNTER — Other Ambulatory Visit: Payer: Self-pay | Admitting: Ophthalmology

## 2015-09-07 ENCOUNTER — Other Ambulatory Visit: Payer: Self-pay | Admitting: Internal Medicine

## 2015-09-07 DIAGNOSIS — E042 Nontoxic multinodular goiter: Secondary | ICD-10-CM

## 2015-10-06 ENCOUNTER — Ambulatory Visit
Admission: RE | Admit: 2015-10-06 | Discharge: 2015-10-06 | Disposition: A | Discharge status: Home / Self Care | Payer: BLUE CROSS/BLUE SHIELD | Source: Ambulatory Visit | Attending: Internal Medicine | Admitting: Internal Medicine

## 2015-10-06 DIAGNOSIS — E042 Nontoxic multinodular goiter: Secondary | ICD-10-CM

## 2016-02-16 ENCOUNTER — Other Ambulatory Visit: Payer: Self-pay | Admitting: Family Medicine

## 2016-02-16 DIAGNOSIS — Z1231 Encounter for screening mammogram for malignant neoplasm of breast: Secondary | ICD-10-CM

## 2016-02-21 ENCOUNTER — Ambulatory Visit
Admission: RE | Admit: 2016-02-21 | Discharge: 2016-02-21 | Disposition: A | Discharge status: Home / Self Care | Payer: BLUE CROSS/BLUE SHIELD | Source: Ambulatory Visit | Attending: Family Medicine | Admitting: Family Medicine

## 2016-02-21 DIAGNOSIS — Z1231 Encounter for screening mammogram for malignant neoplasm of breast: Secondary | ICD-10-CM

## 2016-02-27 DIAGNOSIS — E042 Nontoxic multinodular goiter: Secondary | ICD-10-CM | POA: Insufficient documentation

## 2016-05-31 DIAGNOSIS — J069 Acute upper respiratory infection, unspecified: Secondary | ICD-10-CM | POA: Insufficient documentation

## 2016-05-31 HISTORY — DX: Acute upper respiratory infection, unspecified: J06.9

## 2016-10-02 ENCOUNTER — Other Ambulatory Visit: Payer: Self-pay | Admitting: Internal Medicine

## 2016-10-02 DIAGNOSIS — E042 Nontoxic multinodular goiter: Secondary | ICD-10-CM

## 2016-10-08 ENCOUNTER — Ambulatory Visit
Admission: RE | Admit: 2016-10-08 | Discharge: 2016-10-08 | Disposition: A | Discharge status: Home / Self Care | Payer: BLUE CROSS/BLUE SHIELD | Source: Ambulatory Visit | Attending: Internal Medicine | Admitting: Internal Medicine

## 2016-10-08 DIAGNOSIS — E042 Nontoxic multinodular goiter: Secondary | ICD-10-CM

## 2017-07-15 ENCOUNTER — Encounter: Payer: Self-pay | Admitting: Gastroenterology

## 2018-03-24 DIAGNOSIS — M2021 Hallux rigidus, right foot: Secondary | ICD-10-CM | POA: Insufficient documentation

## 2018-10-28 ENCOUNTER — Other Ambulatory Visit: Payer: Self-pay | Admitting: Internal Medicine

## 2018-10-28 DIAGNOSIS — E042 Nontoxic multinodular goiter: Secondary | ICD-10-CM

## 2018-11-02 ENCOUNTER — Other Ambulatory Visit: Payer: Self-pay | Admitting: Family Medicine

## 2018-11-02 DIAGNOSIS — Z1231 Encounter for screening mammogram for malignant neoplasm of breast: Secondary | ICD-10-CM

## 2018-11-03 ENCOUNTER — Other Ambulatory Visit: Payer: Self-pay | Admitting: Internal Medicine

## 2018-11-03 DIAGNOSIS — E042 Nontoxic multinodular goiter: Secondary | ICD-10-CM

## 2018-11-04 ENCOUNTER — Other Ambulatory Visit: Payer: BLUE CROSS/BLUE SHIELD

## 2018-12-23 ENCOUNTER — Ambulatory Visit: Payer: BLUE CROSS/BLUE SHIELD

## 2019-02-09 ENCOUNTER — Ambulatory Visit
Admission: RE | Admit: 2019-02-09 | Discharge: 2019-02-09 | Disposition: A | Discharge status: Home / Self Care | Payer: BLUE CROSS/BLUE SHIELD | Source: Ambulatory Visit | Attending: Internal Medicine | Admitting: Internal Medicine

## 2019-02-09 DIAGNOSIS — E042 Nontoxic multinodular goiter: Secondary | ICD-10-CM

## 2019-03-12 ENCOUNTER — Other Ambulatory Visit: Payer: Self-pay

## 2019-03-12 ENCOUNTER — Ambulatory Visit
Admission: RE | Admit: 2019-03-12 | Discharge: 2019-03-12 | Disposition: A | Discharge status: Home / Self Care | Payer: BLUE CROSS/BLUE SHIELD | Source: Ambulatory Visit | Attending: Family Medicine | Admitting: Family Medicine

## 2019-03-12 DIAGNOSIS — Z1231 Encounter for screening mammogram for malignant neoplasm of breast: Secondary | ICD-10-CM

## 2019-07-05 DIAGNOSIS — H903 Sensorineural hearing loss, bilateral: Secondary | ICD-10-CM | POA: Insufficient documentation

## 2019-10-28 ENCOUNTER — Ambulatory Visit: Payer: BC Managed Care – PPO | Attending: Internal Medicine

## 2019-10-28 DIAGNOSIS — Z23 Encounter for immunization: Secondary | ICD-10-CM

## 2019-10-28 NOTE — Progress Notes (Signed)
   Covid-19 Vaccination Clinic  Name:  Dana Finley    MRN: [id number] DOB: November 30, 1955  10/28/2019  [name] was observed post Covid-19 immunization for 15 minutes without incident. She was provided with Vaccine Information Sheet and instruction to access the V-Safe system.   [name] was instructed to call 911 with any severe reactions post vaccine: Marland Kitchen Difficulty breathing  . Swelling of face and throat  . A fast heartbeat  . A bad rash all over body  . Dizziness and weakness   Immunizations Administered    Name Date Dose VIS Date Route   Pfizer COVID-19 Vaccine 10/28/2019  2:29 PM 0.3 mL 07/23/2019 Intramuscular   Manufacturer: [place]   Lot: [id number]   [place]: [id number]

## 2019-11-22 ENCOUNTER — Ambulatory Visit: Payer: BC Managed Care – PPO | Attending: Internal Medicine

## 2019-11-22 DIAGNOSIS — Z23 Encounter for immunization: Secondary | ICD-10-CM

## 2019-11-22 NOTE — Progress Notes (Signed)
   Covid-19 Vaccination Clinic  Name:  Dana Finley    MRN: [id number] DOB: 08/27/1955  11/22/2019  Ms. Brownstein was observed post Covid-19 immunization for 15 minutes without incident. She was provided with Vaccine Information Sheet and instruction to access the V-Safe system.   Ms. Cossey was instructed to call 911 with any severe reactions post vaccine: Marland Kitchen Difficulty breathing  . Swelling of face and throat  . A fast heartbeat  . A bad rash all over body  . Dizziness and weakness   Immunizations Administered    Name Date Dose VIS Date Route   Pfizer COVID-19 Vaccine 11/22/2019  2:29 PM 0.3 mL 07/23/2019 Intramuscular   Manufacturer: [place]   Lot: [id number]   [place]: [id number]

## 2020-06-01 ENCOUNTER — Other Ambulatory Visit: Payer: Self-pay | Admitting: Family Medicine

## 2020-06-01 ENCOUNTER — Other Ambulatory Visit: Payer: Self-pay

## 2020-06-01 ENCOUNTER — Ambulatory Visit
Admission: RE | Admit: 2020-06-01 | Discharge: 2020-06-01 | Disposition: A | Discharge status: Home / Self Care | Payer: BC Managed Care – PPO | Source: Ambulatory Visit | Attending: Family Medicine | Admitting: Family Medicine

## 2020-06-01 DIAGNOSIS — Z1231 Encounter for screening mammogram for malignant neoplasm of breast: Secondary | ICD-10-CM

## 2020-06-23 ENCOUNTER — Other Ambulatory Visit: Payer: Self-pay | Admitting: Nephrology

## 2020-06-23 DIAGNOSIS — N1832 Chronic kidney disease, stage 3b: Secondary | ICD-10-CM

## 2020-06-26 ENCOUNTER — Ambulatory Visit
Admission: RE | Admit: 2020-06-26 | Discharge: 2020-06-26 | Disposition: A | Discharge status: Home / Self Care | Payer: BC Managed Care – PPO | Source: Ambulatory Visit | Attending: Nephrology | Admitting: Nephrology

## 2020-06-26 DIAGNOSIS — N1832 Chronic kidney disease, stage 3b: Secondary | ICD-10-CM

## 2021-03-05 ENCOUNTER — Other Ambulatory Visit: Payer: Self-pay | Admitting: Orthopaedic Surgery

## 2021-03-05 DIAGNOSIS — M79671 Pain in right foot: Secondary | ICD-10-CM

## 2021-03-13 ENCOUNTER — Other Ambulatory Visit: Payer: BC Managed Care – PPO

## 2021-03-20 ENCOUNTER — Ambulatory Visit
Admission: RE | Admit: 2021-03-20 | Discharge: 2021-03-20 | Disposition: A | Discharge status: Home / Self Care | Payer: BC Managed Care – PPO | Source: Ambulatory Visit | Attending: Orthopaedic Surgery | Admitting: Orthopaedic Surgery

## 2021-03-20 DIAGNOSIS — M79671 Pain in right foot: Secondary | ICD-10-CM

## 2021-04-09 ENCOUNTER — Other Ambulatory Visit: Payer: Self-pay | Admitting: Orthopaedic Surgery

## 2021-04-09 DIAGNOSIS — M79671 Pain in right foot: Secondary | ICD-10-CM

## 2021-04-22 ENCOUNTER — Ambulatory Visit
Admission: RE | Admit: 2021-04-22 | Discharge: 2021-04-22 | Disposition: A | Discharge status: Home / Self Care | Payer: 59 | Source: Ambulatory Visit | Attending: Orthopaedic Surgery | Admitting: Orthopaedic Surgery

## 2021-04-22 ENCOUNTER — Other Ambulatory Visit: Payer: Self-pay

## 2021-04-22 DIAGNOSIS — M79671 Pain in right foot: Secondary | ICD-10-CM

## 2021-07-19 ENCOUNTER — Other Ambulatory Visit (HOSPITAL_BASED_OUTPATIENT_CLINIC_OR_DEPARTMENT_OTHER): Payer: Self-pay

## 2021-07-19 MED ORDER — INFLUENZA VAC A&B SA ADJ QUAD 0.5 ML IM PRSY
PREFILLED_SYRINGE | INTRAMUSCULAR | 0 refills | Status: DC
  Filled 2021-07-19: qty 0.5, 1d supply, fill #0

## 2021-07-23 ENCOUNTER — Other Ambulatory Visit (HOSPITAL_BASED_OUTPATIENT_CLINIC_OR_DEPARTMENT_OTHER): Payer: Self-pay

## 2021-07-27 ENCOUNTER — Ambulatory Visit: Payer: Self-pay | Attending: Internal Medicine

## 2021-07-27 ENCOUNTER — Other Ambulatory Visit (HOSPITAL_BASED_OUTPATIENT_CLINIC_OR_DEPARTMENT_OTHER): Payer: Self-pay

## 2021-07-27 DIAGNOSIS — Z23 Encounter for immunization: Secondary | ICD-10-CM

## 2021-07-27 MED ORDER — PFIZER COVID-19 VAC BIVALENT 30 MCG/0.3ML IM SUSP
INTRAMUSCULAR | 0 refills | Status: DC
  Filled 2021-07-27: qty 0.3, 1d supply, fill #0

## 2021-07-27 NOTE — Progress Notes (Signed)
° °  Covid-19 Vaccination Clinic  Name:  Dana Finley    MRN: [id number] DOB: 1955/10/19  07/27/2021  [name] was observed post Covid-19 immunization for 15 minutes without incident. She was provided with Vaccine Information Sheet and instruction to access the V-Safe system.   [name] was instructed to call 911 with any severe reactions post vaccine: Difficulty breathing  Swelling of face and throat  A fast heartbeat  A bad rash all over body  Dizziness and weakness   Immunizations Administered     Name Date Dose VIS Date Route   Pfizer Covid-19 Vaccine Bivalent Booster 07/27/2021  1:09 PM 0.3 mL 04/11/2021 Intramuscular   Manufacturer: [place]   Lot: [id number]   [place]: [phone]

## 2021-08-27 ENCOUNTER — Other Ambulatory Visit: Payer: Self-pay

## 2021-08-27 ENCOUNTER — Encounter (HOSPITAL_BASED_OUTPATIENT_CLINIC_OR_DEPARTMENT_OTHER): Payer: Self-pay | Admitting: Family Medicine

## 2021-08-27 ENCOUNTER — Ambulatory Visit (INDEPENDENT_AMBULATORY_CARE_PROVIDER_SITE_OTHER): Payer: Medicare Other | Admitting: Family Medicine

## 2021-08-27 VITALS — BP 123/55 | HR 75 | Ht 64.0 in | Wt 164.0 lb

## 2021-08-27 DIAGNOSIS — N189 Chronic kidney disease, unspecified: Secondary | ICD-10-CM | POA: Insufficient documentation

## 2021-08-27 DIAGNOSIS — I10 Essential (primary) hypertension: Secondary | ICD-10-CM

## 2021-08-27 DIAGNOSIS — F909 Attention-deficit hyperactivity disorder, unspecified type: Secondary | ICD-10-CM | POA: Diagnosis not present

## 2021-08-27 DIAGNOSIS — G4700 Insomnia, unspecified: Secondary | ICD-10-CM | POA: Diagnosis not present

## 2021-08-27 DIAGNOSIS — N1831 Chronic kidney disease, stage 3a: Secondary | ICD-10-CM

## 2021-08-27 DIAGNOSIS — N1832 Chronic kidney disease, stage 3b: Secondary | ICD-10-CM | POA: Insufficient documentation

## 2021-08-27 DIAGNOSIS — E039 Hypothyroidism, unspecified: Secondary | ICD-10-CM

## 2021-08-27 DIAGNOSIS — G43909 Migraine, unspecified, not intractable, without status migrainosus: Secondary | ICD-10-CM

## 2021-08-27 NOTE — Assessment & Plan Note (Signed)
Continue current dosing of levothyroxine, continue follow-up with endocrinologist regarding management

## 2021-08-27 NOTE — Progress Notes (Incomplete)
New Patient Office Visit  Subjective:  Patient ID: Dana Finley, female    DOB: 1956/07/19  Age: 66 y.o. MRN: [id number]  CC:  Chief Complaint  Patient presents with   Establish Care    Prior PCP - Dr. Melford Aase   Urinary Tract Infection    Patient was seen in the ED for covid and was told she has a UTI and was treated with antibiotics. She is requesting a UA today to ensure the infection is cleared. She denies any current symptoms.    HPI Dana Finley is a 66 year old female presenting to establish in clinic.  She has current concerns as outlined above.  Past medical history significant for hypertension, hypothyroidism, ADD, anxiety/depression, migraines, insomnia.  Hypertension:  Hypothyroidism:  ADD:  Anxiety/depression:  Migraines:  Insomnia:  CKD: On review of chart, this is stage IIIa, does follow with nephrology.  Past Medical History:  Diagnosis Date   ADD (attention deficit disorder)    Anxiety    Depression    Diverticula of colon    Hemorrhoids    History of colonic polyps hyperplastic   Hypertension    Hypothyroidism    IBS (irritable bowel syndrome)    Insomnia, persistent    Migraine headache without aura    lumbosacral disc desease   Osteopenia    Serrated adenoma of colon 06/2012   Weight loss     Past Surgical History:  Procedure Laterality Date   ABDOMINAL HYSTERECTOMY     1996   APPENDECTOMY     CATARACT EXTRACTION     bilateral   COLONOSCOPY     EYE SURGERY     muscle surgery; has had approx 18 surgeries on both eyes   POLYPECTOMY      Family History  Problem Relation Age of Onset   Diabetes Mother    Heart disease Mother    Stroke Mother    Early death Sister    Colon cancer Sister 2   Pancreatic cancer Father 68   Stomach cancer Maternal Uncle 37   Leukemia Brother    Colon cancer Cousin 40   Colon cancer Maternal Uncle 60   Colon polyps Son    Rectal cancer Neg Hx     Social  History   Socioeconomic History   Marital status: Married    Spouse name: Not on file   Number of children: 2   Years of education: Not on file   Highest education level: Not on file  Occupational History   Occupation: home    Employer: UNEMPLOYED  Tobacco Use   Smoking status: Former    Types: Cigarettes    Quit date: 06/12/1977    Years since quitting: 44.2   Smokeless tobacco: Never  Substance and Sexual Activity   Alcohol use: Yes    Comment: occasional   Drug use: No   Sexual activity: Not on file  Other Topics Concern   Not on file  Social History Narrative   Not on file   Social Determinants of Health   Financial Resource Strain: Not on file  Food Insecurity: Not on file  Transportation Needs: Not on file  Physical Activity: Not on file  Stress: Not on file  Social Connections: Not on file  Intimate Partner Violence: Not on file    Objective:   Today's Vitals: BP (!) 123/55    Pulse 75    Ht 5\' 4"  (1.626 m)    Wt 164 lb (74.4 kg)  SpO2 99%    BMI 28.15 kg/m   Physical Exam    Assessment & Plan:   Problem List Items Addressed This Visit       Cardiovascular and Mediastinum   Migraines    Continue with current regimen of utilizing triptan as needed to help control symptoms, would generally avoid use of NSAIDs due to underlying chronic kidney disease      Relevant Medications   sertraline (ZOLOFT) 100 MG tablet   pravastatin (PRAVACHOL) 40 MG tablet   traZODone (DESYREL) 150 MG tablet   zolmitriptan (ZOMIG) 5 MG tablet   lisinopril-hydrochlorothiazide (ZESTORETIC) 10-12.5 MG tablet   ESSENTIAL HYPERTENSION, BENIGN - Primary    Blood pressure at goal in office today Continue with current regimen of lisinopril-hydrochlorothiazide 10-12.5 mg Recommend intermittent monitoring at home Recommend DASH diet, regular aerobic exercise      Relevant Medications   pravastatin (PRAVACHOL) 40 MG tablet   lisinopril-hydrochlorothiazide  (ZESTORETIC) 10-12.5 MG tablet     Endocrine   Hypothyroidism    Continue current dosing of levothyroxine, continue follow-up with endocrinologist regarding management        Genitourinary   Chronic kidney disease    Continue follow-up with nephrology.  Avoid nephrotoxic medications, primarily would limit use of NSAIDs Request records from nephrology for review        Other   INSOMNIA, CHRONIC    Reviewed with patient potential causes and considerations Primary concern is related to impact of medications on sleep issues, primarily related to Adderall which she is currently taking twice daily.  May need to consider adjusting administration of Adderall, particularly with either decreasing afternoon dose or switching to once daily dosing in the morning, could consider switching to extended release if making this change Discussed that ideally we would not be wanting to treat potential side effects of medication with additional pharmacotherapy such as if insomnia is primarily being contributed to by Adderall and then Trazodone as a sleep aid Also discussed role of CBT, patient seemed hesitant regarding potential therapeutic impact of CBT Provided handout regarding sleep hygiene guidelines.  Discussed importance of avoiding daytime naps, bedtime routine, avoiding screen time within 1 to 2 hours of bedtime We will follow-up at future visits      ADD (attention deficit disorder)    May continue with current Adderall dosing, although may need to consider adjusting dosing schedule as outlined above due to ongoing issues with insomnia       Outpatient Encounter Medications as of 08/27/2021  Medication Sig   amphetamine-dextroamphetamine (ADDERALL) 30 MG tablet Take 1 tablet (30 mg total) by mouth 2 (two) times daily.   aspirin 81 MG tablet Take 81 mg by mouth daily.     levothyroxine (SYNTHROID, LEVOTHROID) 25 MCG tablet Take 25 mcg by mouth daily.    lisinopril-hydrochlorothiazide  (ZESTORETIC) 10-12.5 MG tablet Take 1 tablet by mouth daily.   pravastatin (PRAVACHOL) 40 MG tablet Take 1 tablet by mouth daily.   sertraline (ZOLOFT) 100 MG tablet Take 100 mg by mouth daily.   traZODone (DESYREL) 150 MG tablet Take 150-300 mg by mouth at bedtime as needed.   zolmitriptan (ZOMIG) 5 MG tablet Take 2 tablets by mouth daily as needed.   [DISCONTINUED] COVID-19 mRNA bivalent vaccine, Pfizer, (PFIZER COVID-19 VAC BIVALENT) injection Inject into the muscle. (Patient not taking: Reported on 08/27/2021)   [DISCONTINUED] influenza vaccine adjuvanted (FLUAD) 0.5 ML injection Inject into the muscle. (Patient not taking: Reported on 08/27/2021)   [DISCONTINUED] olmesartan-hydrochlorothiazide [facility]  [facility]) 40-12.5 MG per tablet Take 0.5 tablets by mouth daily. (Patient not taking: Reported on 08/27/2021)   [DISCONTINUED] SUMAtriptan (IMITREX) 6 MG/0.5ML SOLN injection Inject 6 mg into the skin every 2 (two) hours as needed. F (Patient not taking: Reported on 08/27/2021)   [DISCONTINUED] zolmitriptan (ZOMIG) 5 MG tablet Take 1 tablet (5 mg total) by mouth as needed. For migraine  (Patient not taking: Reported on 08/27/2021)   No facility-administered encounter medications on file as of 08/27/2021.    Follow-up: Return in about 3 months (around 11/25/2021).   Mikinzie Maciejewski J [place], MD

## 2021-08-27 NOTE — Assessment & Plan Note (Signed)
May continue with current Adderall dosing, although may need to consider adjusting dosing schedule as outlined above due to ongoing issues with insomnia

## 2021-08-27 NOTE — Assessment & Plan Note (Signed)
Blood pressure at goal in office today Continue with current regimen of lisinopril-hydrochlorothiazide 10-12.5 mg Recommend intermittent monitoring at home Recommend DASH diet, regular aerobic exercise

## 2021-08-27 NOTE — Patient Instructions (Signed)
°  Medication Instructions:  Your physician recommends that you continue on your current medications as directed. Please refer to the Current Medication list given to you today. --If you need a refill on any your medications before your next appointment, please call your pharmacy first. If no refills are authorized on file call the office.-- Follow-Up: Your next appointment:   Your physician recommends that you schedule a follow-up appointment in: 2-3 MONTHS with Dr. de [place]  You will receive a text message or e-mail with a link to a survey about your care and experience with [place] today! We would greatly appreciate your feedback!   Thanks for letting us be apart of your health journey!!  Primary Care and Sports Medicine   Dr. Arlina Robes [place]   We encourage you to activate your patient portal called "MyChart".  Sign up information is provided on this After Visit Summary.  MyChart is used to connect with patients for Virtual Visits (Telemedicine).  Patients are able to view lab/test results, encounter notes, upcoming appointments, etc.  Non-urgent messages can be sent to your provider as well. To learn more about what you can do with MyChart, please visit --  [email or web address].

## 2021-08-27 NOTE — Assessment & Plan Note (Signed)
Continue follow-up with nephrology.  Avoid nephrotoxic medications, primarily would limit use of NSAIDs Request records from nephrology for review

## 2021-08-27 NOTE — Assessment & Plan Note (Signed)
>>  ASSESSMENT AND PLAN FOR CHRONIC KIDNEY DISEASE WRITTEN ON 08/27/2021  5:16 PM BY DE Peru, Buren Kos, MD  Continue follow-up with nephrology.  Avoid nephrotoxic medications, primarily would limit use of NSAIDs Request records from nephrology for review

## 2021-08-27 NOTE — Assessment & Plan Note (Addendum)
Continue with current regimen of utilizing triptan as needed to help control symptoms, would generally avoid use of NSAIDs due to underlying chronic kidney disease

## 2021-08-27 NOTE — Assessment & Plan Note (Signed)
Reviewed with patient potential causes and considerations Primary concern is related to impact of medications on sleep issues, primarily related to Adderall which she is currently taking twice daily.  May need to consider adjusting administration of Adderall, particularly with either decreasing afternoon dose or switching to once daily dosing in the morning, could consider switching to extended release if making this change Discussed that ideally we would not be wanting to treat potential side effects of medication with additional pharmacotherapy such as if insomnia is primarily being contributed to by Adderall and then Trazodone as a sleep aid Also discussed role of CBT, patient seemed hesitant regarding potential therapeutic impact of CBT Provided handout regarding sleep hygiene guidelines.  Discussed importance of avoiding daytime naps, bedtime routine, avoiding screen time within 1 to 2 hours of bedtime We will follow-up at future visits

## 2021-08-31 ENCOUNTER — Encounter (HOSPITAL_BASED_OUTPATIENT_CLINIC_OR_DEPARTMENT_OTHER): Payer: Self-pay | Admitting: Family Medicine

## 2021-09-05 DIAGNOSIS — M25552 Pain in left hip: Secondary | ICD-10-CM

## 2021-09-05 HISTORY — DX: Pain in left hip: M25.552

## 2021-11-01 ENCOUNTER — Other Ambulatory Visit: Payer: Self-pay | Admitting: Internal Medicine

## 2021-11-01 DIAGNOSIS — E042 Nontoxic multinodular goiter: Secondary | ICD-10-CM

## 2021-11-02 ENCOUNTER — Ambulatory Visit
Admission: RE | Admit: 2021-11-02 | Discharge: 2021-11-02 | Disposition: A | Discharge status: Home / Self Care | Payer: Medicare Other | Source: Ambulatory Visit | Attending: Internal Medicine | Admitting: Internal Medicine

## 2021-11-02 ENCOUNTER — Other Ambulatory Visit: Payer: Self-pay

## 2021-11-02 DIAGNOSIS — E042 Nontoxic multinodular goiter: Secondary | ICD-10-CM

## 2021-11-26 ENCOUNTER — Ambulatory Visit (INDEPENDENT_AMBULATORY_CARE_PROVIDER_SITE_OTHER): Payer: Medicare Other | Admitting: Family Medicine

## 2021-11-26 ENCOUNTER — Encounter (HOSPITAL_BASED_OUTPATIENT_CLINIC_OR_DEPARTMENT_OTHER): Payer: Self-pay | Admitting: Family Medicine

## 2021-11-26 VITALS — BP 127/68 | HR 82 | Temp 97.7°F | Ht 67.0 in | Wt 164.9 lb

## 2021-11-26 DIAGNOSIS — G4700 Insomnia, unspecified: Secondary | ICD-10-CM

## 2021-11-26 DIAGNOSIS — N1831 Chronic kidney disease, stage 3a: Secondary | ICD-10-CM

## 2021-11-26 DIAGNOSIS — L989 Disorder of the skin and subcutaneous tissue, unspecified: Secondary | ICD-10-CM | POA: Diagnosis not present

## 2021-11-26 DIAGNOSIS — Z1231 Encounter for screening mammogram for malignant neoplasm of breast: Secondary | ICD-10-CM

## 2021-11-26 DIAGNOSIS — G43909 Migraine, unspecified, not intractable, without status migrainosus: Secondary | ICD-10-CM | POA: Diagnosis not present

## 2021-11-26 DIAGNOSIS — F909 Attention-deficit hyperactivity disorder, unspecified type: Secondary | ICD-10-CM

## 2021-11-26 DIAGNOSIS — Z0000 Encounter for general adult medical examination without abnormal findings: Secondary | ICD-10-CM

## 2021-11-26 HISTORY — DX: Disorder of the skin and subcutaneous tissue, unspecified: L98.9

## 2021-11-26 MED ORDER — AMPHETAMINE-DEXTROAMPHETAMINE 30 MG PO TABS: 30.0000 mg | ORAL_TABLET | Freq: Two times a day (BID) | ORAL | 0 refills | Status: DC

## 2021-11-26 NOTE — Assessment & Plan Note (Signed)
Has been doing better ?Continues with trazodone - has been taking slightly earlier, typically taking two tablets ?Taking earlier has allowed this to be more helpful for her, she feels she is doing well at present with this ?Can continue with trazodone as well as lifestyle modifications ?

## 2021-11-26 NOTE — Assessment & Plan Note (Signed)
She reports that she first noticed this about 1 month ago.  Remembers waking during the night and scratching something off under her eyebrow and had a lesion develop shortly after.  Has some itching associated, no significant pain or bleeding/discharge.  She has been putting topical antibiotic ointment on it.  Feels that she has maybe had some improvement over the past week ?On exam, has an area of mild erythema, no discharge, no swelling, slightly pearly appearance ?Uncertain etiology, however feel that area should continue to improve over the coming weeks, if it does not then would consider referral to dermatology for further evaluation of possible precancerous skin lesion ?Advised to let us know if it does not resolve in the next 2 to 4 weeks and we can place referral ?

## 2021-11-26 NOTE — Assessment & Plan Note (Signed)
Will need to continue to be mindful of medications related to underlying CKD ?We will plan to reassess with upcoming labs for annual physical ?

## 2021-11-26 NOTE — Assessment & Plan Note (Signed)
Continues to use zolmitriptan to help with symptoms as needed.  She did have a migraine over Easter weekend which persisted for a few days.  She does have questions regarding alternative treatment options, specifically Nurtec ?Discussed pharmacotherapy options with patient.  She knows to avoid NSAIDs given her underlying CKD ?Can continue zolmitriptan at this time, her husband will look further into Nurtec's availability based on insurance coverage ?

## 2021-11-26 NOTE — Assessment & Plan Note (Signed)
>>  ASSESSMENT AND PLAN FOR CHRONIC KIDNEY DISEASE WRITTEN ON 11/26/2021  1:46 PM BY DE Peru, RAYMOND J, MD  Will need to continue to be mindful of medications related to underlying CKD We will plan to reassess with upcoming labs for annual physical

## 2021-11-26 NOTE — Patient Instructions (Signed)
Migraine Headache ?A migraine headache is a very strong throbbing pain on one side or both sides of your head. This type of headache can also cause other symptoms. It can last from 4 hours to 3 days. Talk with your doctor about what things may bring on (trigger) this condition. ?What are the causes? ?The exact cause of this condition is not known. This condition may be triggered or caused by: ?Drinking alcohol. ?Smoking. ?Taking medicines, such as: ?Medicine used to treat chest pain (nitroglycerin). ?Birth control pills. ?Estrogen. ?Some blood pressure medicines. ?Eating or drinking certain products. ?Doing physical activity. ?Other things that may trigger a migraine headache include: ?Having a menstrual period. ?Pregnancy. ?Hunger. ?Stress. ?Not getting enough sleep or getting too much sleep. ?Weather changes. ?Tiredness (fatigue). ?What increases the risk? ?Being 25-55 years old. ?Being female. ?Having a family history of migraine headaches. ?Being Caucasian. ?Having depression or anxiety. ?Being very overweight. ?What are the signs or symptoms? ?A throbbing pain. This pain may: ?Happen in any area of the head, such as on one side or both sides. ?Make it hard to do daily activities. ?Get worse with physical activity. ?Get worse around bright lights or loud noises. ?Other symptoms may include: ?Feeling sick to your stomach (nauseous). ?Vomiting. ?Dizziness. ?Being sensitive to bright lights, loud noises, or smells. ?Before you get a migraine headache, you may get warning signs (an aura). An aura may include: ?Seeing flashing lights or having blind spots. ?Seeing bright spots, halos, or zigzag lines. ?Having tunnel vision or blurred vision. ?Having numbness or a tingling feeling. ?Having trouble talking. ?Having weak muscles. ?Some people have symptoms after a migraine headache (postdromal phase), such as: ?Tiredness. ?Trouble thinking (concentrating). ?How is this treated? ?Taking medicines that: ?Relieve  pain. ?Relieve the feeling of being sick to your stomach. ?Prevent migraine headaches. ?Treatment may also include: ?Having acupuncture. ?Avoiding foods that bring on migraine headaches. ?Learning ways to control your body functions (biofeedback). ?Therapy to help you know and deal with negative thoughts (cognitive behavioral therapy). ?Follow these instructions at home: ?Medicines ?Take over-the-counter and prescription medicines only as told by your doctor. ?Ask your doctor if the medicine prescribed to you: ?Requires you to avoid driving or using heavy machinery. ?Can cause trouble pooping (constipation). You may need to take these steps to prevent or treat trouble pooping: ?Drink enough fluid to keep your pee (urine) pale yellow. ?Take over-the-counter or prescription medicines. ?Eat foods that are high in fiber. These include beans, whole grains, and fresh fruits and vegetables. ?Limit foods that are high in fat and sugar. These include fried or sweet foods. ?Lifestyle ?Do not drink alcohol. ?Do not use any products that contain nicotine or tobacco, such as cigarettes, e-cigarettes, and chewing tobacco. If you need help quitting, ask your doctor. ?Get at least 8 hours of sleep every night. ?Limit and deal with stress. ?General instructions ? ?  ? ?Keep a journal to find out what may bring on your migraine headaches. For example, write down: ?What you eat and drink. ?How much sleep you get. ?Any change in what you eat or drink. ?Any change in your medicines. ?If you have a migraine headache: ?Avoid things that make your symptoms worse, such as bright lights. ?It may help to lie down in a dark, quiet room. ?Do not drive or use heavy machinery. ?Ask your doctor what activities are safe for you. ?Keep all follow-up visits as told by your doctor. This is important. ?Contact a doctor if: ?You get a migraine   headache that is different or worse than others you have had. ?You have more than 15 headache days in one  month. ?Get help right away if: ?Your migraine headache gets very bad. ?Your migraine headache lasts longer than 72 hours. ?You have a fever. ?You have a stiff neck. ?You have trouble seeing. ?Your muscles feel weak or like you cannot control them. ?You start to lose your balance a lot. ?You start to have trouble walking. ?You pass out (faint). ?You have a seizure. ?Summary ?A migraine headache is a very strong throbbing pain on one side or both sides of your head. These headaches can also cause other symptoms. ?This condition may be treated with medicines and changes to your lifestyle. ?Keep a journal to find out what may bring on your migraine headaches. ?Contact a doctor if you get a migraine headache that is different or worse than others you have had. ?Contact your doctor if you have more than 15 headache days in a month. ?This information is not intended to replace advice given to you by your health care provider. Make sure you discuss any questions you have with your health care provider. ?Document Revised: 11/20/2018 Document Reviewed: 09/10/2018 ?Elsevier Patient Education ? 2023 Elsevier Inc[employer] ? ?

## 2021-11-26 NOTE — Progress Notes (Signed)
? ? ?  Procedures performed today:   ? ?None. ? ?Independent interpretation of notes and tests performed by another provider:  ? ?None. ? ?Brief History, Exam, Impression, and Recommendations:   ? ?BP 127/68   Pulse 82   Temp 97.7 ?F (36.5 ?C)   Ht '5\' 7"'$  (1.702 m)   Wt 164 lb 14.4 oz (74.8 kg)   SpO2 98%   BMI 25.83 kg/m?  ? ?INSOMNIA, CHRONIC ?Has been doing better ?Continues with trazodone - has been taking slightly earlier, typically taking two tablets ?Taking earlier has allowed this to be more helpful for her, she feels she is doing well at present with this ?Can continue with trazodone as well as lifestyle modifications ? ?Chronic kidney disease ?Will need to continue to be mindful of medications related to underlying CKD ?We will plan to reassess with upcoming labs for annual physical ? ?Migraines ?Continues to use zolmitriptan to help with symptoms as needed.  She did have a migraine over Easter weekend which persisted for a few days.  She does have questions regarding alternative treatment options, specifically Nurtec ?Discussed pharmacotherapy options with patient.  She knows to avoid NSAIDs given her underlying CKD ?Can continue zolmitriptan at this time, her husband will look further into Nurtec's availability based on insurance coverage ? ?Skin lesion ?She reports that she first noticed this about 1 month ago.  Remembers waking during the night and scratching something off under her eyebrow and had a lesion develop shortly after.  Has some itching associated, no significant pain or bleeding/discharge.  She has been putting topical antibiotic ointment on it.  Feels that she has maybe had some improvement over the past week ?On exam, has an area of mild erythema, no discharge, no swelling, slightly pearly appearance ?Uncertain etiology, however feel that area should continue to improve over the coming weeks, if it does not then would consider referral to dermatology for further evaluation of possible  precancerous skin lesion ?Advised to let us know if it does not resolve in the next 2 to 4 weeks and we can place referral ? ?Scar over both eyebrows from prior surgery and along left side of face from fall in the past ?Does have small scars present above both eyebrows, along left side of face.  Discussed unfortunately is not a lot of great options related to this.  Did discuss potential lotions/creams which can be used, massaging over areas ? ?Plan for follow-up in about 2 to 3 months for CPE, will complete labs about 1 week prior ?Requesting referral for screening mammogram, order placed ? ? ?___________________________________________ ?Ayzia Day de [place], MD, ABFM, CAQSM ?Primary Care and Sports Medicine ?[place] ?

## 2021-12-14 ENCOUNTER — Ambulatory Visit (HOSPITAL_BASED_OUTPATIENT_CLINIC_OR_DEPARTMENT_OTHER)
Admission: RE | Admit: 2021-12-14 | Discharge: 2021-12-14 | Disposition: A | Discharge status: Home / Self Care | Payer: Medicare Other | Source: Ambulatory Visit | Attending: Family Medicine | Admitting: Family Medicine

## 2021-12-14 ENCOUNTER — Encounter (HOSPITAL_BASED_OUTPATIENT_CLINIC_OR_DEPARTMENT_OTHER): Payer: Self-pay | Admitting: Radiology

## 2021-12-14 DIAGNOSIS — Z1231 Encounter for screening mammogram for malignant neoplasm of breast: Secondary | ICD-10-CM | POA: Insufficient documentation

## 2021-12-16 ENCOUNTER — Encounter (HOSPITAL_BASED_OUTPATIENT_CLINIC_OR_DEPARTMENT_OTHER): Payer: Self-pay | Admitting: Family Medicine

## 2021-12-17 NOTE — Progress Notes (Signed)
Pt seen her mammogram in mychart she is aware of it. ? ?Thanks

## 2021-12-18 ENCOUNTER — Encounter (HOSPITAL_BASED_OUTPATIENT_CLINIC_OR_DEPARTMENT_OTHER): Payer: Self-pay | Admitting: Family Medicine

## 2021-12-18 ENCOUNTER — Telehealth (HOSPITAL_BASED_OUTPATIENT_CLINIC_OR_DEPARTMENT_OTHER): Payer: Self-pay | Admitting: Family Medicine

## 2021-12-18 ENCOUNTER — Ambulatory Visit (INDEPENDENT_AMBULATORY_CARE_PROVIDER_SITE_OTHER): Payer: Medicare Other | Admitting: Family Medicine

## 2021-12-18 VITALS — BP 128/62 | HR 77 | Temp 97.6°F | Ht 67.0 in | Wt 164.3 lb

## 2021-12-18 DIAGNOSIS — W19XXXA Unspecified fall, initial encounter: Secondary | ICD-10-CM | POA: Insufficient documentation

## 2021-12-18 DIAGNOSIS — I10 Essential (primary) hypertension: Secondary | ICD-10-CM | POA: Diagnosis not present

## 2021-12-18 HISTORY — DX: Unspecified fall, initial encounter: W19.XXXA

## 2021-12-18 MED ORDER — LISINOPRIL 10 MG PO TABS: 10.0000 mg | ORAL_TABLET | Freq: Every day | ORAL | 1 refills | Status: DC

## 2021-12-18 NOTE — Assessment & Plan Note (Signed)
Some concerns for low blood pressure readings at home intermittently which could be contributing to symptoms of dizziness and possibly related to falls that she has had recently.  We will make some adjustments in her medication regimen today as outlined above ?Recommend continuing to monitor her blood pressure closely and we will review at next follow-up in the next 2 to 3 weeks ?

## 2021-12-18 NOTE — Telephone Encounter (Signed)
When checking out pt from appt on 5/9 she stated that she didn't get a clear answer from provider why her legs feel so weak/ feel hard to move when walking/standing. She would like a call from [facility] with why this could be a reason. Please advise.  ?

## 2021-12-18 NOTE — Assessment & Plan Note (Signed)
Patient reports recent issue with falls.  These have come in various situations.  Most recently she recalls that she was on a road trip and got out of the car after having sit for about 3 to 4 hours.  At that time she got out at a restaurant and after taking a few steps out of the car, she ended up falling forward.  She does not recall much regarding the fall, does not remember experiencing any presyncopal feelings, any dizziness or tunnel vision before the fall.  After the fall she recalls lying on the ground and awaking to her husband by her.  This fall was within the past week.  Only residual symptoms at this time include mild headache.  She reports prior falls over the past few months which have occurred when in the bathroom and while on a short stool.  Does not recall that these were shortly after standing.  She has felt some lightheadedness/dizziness at times.  Occasionally she has checked her blood pressure during these times and it has been reportedly low. ?Today, patient is in no acute distress, blood pressure is normal, normal pulse. ?History concerning for intermittent low blood pressure which could be positional in nature, possibly exacerbated by antihypertensives ?Discussed options with patient, at this time will reduce current antihypertensive regimen, will transition from combination pill of lisinopril and hydrochlorothiazide and change to lisinopril alone.  Recommend continued intermittent monitoring of blood pressure at home, monitoring of symptoms such as lightheadedness or dizziness ?We will plan for close follow-up in about 2 to 3 weeks to monitor progress with above ?If continuing to have issues, may also need to consider evaluation with specialist such as cardiology, possibly with neurology ?

## 2021-12-18 NOTE — Progress Notes (Signed)
? ? ?  Procedures performed today:   ? ?None. ? ?Independent interpretation of notes and tests performed by another provider:  ? ?None. ? ?Brief History, Exam, Impression, and Recommendations:   ? ?BP 128/62   Pulse 77   Temp 97.6 ?F (36.4 ?C) (Oral)   Ht '5\' 7"'$  (1.702 m)   Wt 164 lb 4.8 oz (74.5 kg)   SpO2 96%   BMI 25.73 kg/m?  ? ?Fall ?Patient reports recent issue with falls.  These have come in various situations.  Most recently she recalls that she was on a road trip and got out of the car after having sit for about 3 to 4 hours.  At that time she got out at a restaurant and after taking a few steps out of the car, she ended up falling forward.  She does not recall much regarding the fall, does not remember experiencing any presyncopal feelings, any dizziness or tunnel vision before the fall.  After the fall she recalls lying on the ground and awaking to her husband by her.  This fall was within the past week.  Only residual symptoms at this time include mild headache.  She reports prior falls over the past few months which have occurred when in the bathroom and while on a short stool.  Does not recall that these were shortly after standing.  She has felt some lightheadedness/dizziness at times.  Occasionally she has checked her blood pressure during these times and it has been reportedly low. ?Today, patient is in no acute distress, blood pressure is normal, normal pulse. ?History concerning for intermittent low blood pressure which could be positional in nature, possibly exacerbated by antihypertensives ?Discussed options with patient, at this time will reduce current antihypertensive regimen, will transition from combination pill of lisinopril and hydrochlorothiazide and change to lisinopril alone.  Recommend continued intermittent monitoring of blood pressure at home, monitoring of symptoms such as lightheadedness or dizziness ?We will plan for close follow-up in about 2 to 3 weeks to monitor progress  with above ?If continuing to have issues, may also need to consider evaluation with specialist such as cardiology, possibly with neurology ? ?ESSENTIAL HYPERTENSION, BENIGN ?Some concerns for low blood pressure readings at home intermittently which could be contributing to symptoms of dizziness and possibly related to falls that she has had recently.  We will make some adjustments in her medication regimen today as outlined above ?Recommend continuing to monitor her blood pressure closely and we will review at next follow-up in the next 2 to 3 weeks ? ? ?___________________________________________ ?Lenia Housley de [place], MD, ABFM, CAQSM ?Primary Care and Sports Medicine ?[place] ?

## 2022-01-09 ENCOUNTER — Encounter (HOSPITAL_BASED_OUTPATIENT_CLINIC_OR_DEPARTMENT_OTHER): Payer: Self-pay | Admitting: Family Medicine

## 2022-01-09 ENCOUNTER — Ambulatory Visit (INDEPENDENT_AMBULATORY_CARE_PROVIDER_SITE_OTHER): Payer: Medicare Other | Admitting: Family Medicine

## 2022-01-09 VITALS — BP 119/57 | HR 76 | Ht 67.0 in | Wt 164.2 lb

## 2022-01-09 DIAGNOSIS — I10 Essential (primary) hypertension: Secondary | ICD-10-CM | POA: Diagnosis not present

## 2022-01-09 DIAGNOSIS — R29898 Other symptoms and signs involving the musculoskeletal system: Secondary | ICD-10-CM | POA: Insufficient documentation

## 2022-01-09 HISTORY — DX: Other symptoms and signs involving the musculoskeletal system: R29.898

## 2022-01-09 MED ORDER — LISINOPRIL 10 MG PO TABS: 10.0000 mg | ORAL_TABLET | Freq: Every day | ORAL | 1 refills | Status: DC

## 2022-01-09 NOTE — Assessment & Plan Note (Signed)
Her dizziness has improved, however she continues to have some slight weakness in both legs.  Suspect this is mostly related to underlying deconditioning and would recommend exercise regimen, working with physical therapy to improve strength, balance, coordination, reduce risk of fall Referral placed to physical therapy downstairs, she is a member at [employer] as well and feel that this would be a good transition in regards to working with physical therapy and being more specifically provided with home exercise program related to this

## 2022-01-09 NOTE — Assessment & Plan Note (Signed)
She has been doing well with lisinopril alone, we stopped hydrochlorothiazide at last appointment.  She reports that dizziness has resolved.  Has not had any further falls since last visit.  She does have blood pressure log with her today and generally blood pressure readings have been in the 300P to 233A systolic, diastolic has been in the 07M to 70s.  She has had some occasional readings with the systolic in the 226J, rarely in the 150s.  She has some concerns regarding these blood pressure readings as they have been lower in the past Discussed risks and benefits related to blood pressure management and that we do want to adequately control her blood pressure, however we also want to limit any potential risk such as overtreatment of the blood pressure and thus having risk of falls Discussed that for the most part her blood pressure has been at goal and given that she has had improvement in dizziness and has not had any concerns for falls recently, recommend continuing with current dose of medication and monitoring blood pressure at home Discussed additional lifestyle medications including exercise, DASH diet

## 2022-01-09 NOTE — Progress Notes (Signed)
    Procedures performed today:    None.  Independent interpretation of notes and tests performed by another provider:   None.  Brief History, Exam, Impression, and Recommendations:    BP (!) 119/57   Pulse 76   Ht '5\' 7"'$  (1.702 m)   Wt 164 lb 3.2 oz (74.5 kg)   SpO2 96%   BMI 25.72 kg/m   ESSENTIAL HYPERTENSION, BENIGN She has been doing well with lisinopril alone, we stopped hydrochlorothiazide at last appointment.  She reports that dizziness has resolved.  Has not had any further falls since last visit.  She does have blood pressure log with her today and generally blood pressure readings have been in the 951O to 841Y systolic, diastolic has been in the 60Y to 70s.  She has had some occasional readings with the systolic in the 301S, rarely in the 150s.  She has some concerns regarding these blood pressure readings as they have been lower in the past Discussed risks and benefits related to blood pressure management and that we do want to adequately control her blood pressure, however we also want to limit any potential risk such as overtreatment of the blood pressure and thus having risk of falls Discussed that for the most part her blood pressure has been at goal and given that she has had improvement in dizziness and has not had any concerns for falls recently, recommend continuing with current dose of medication and monitoring blood pressure at home Discussed additional lifestyle medications including exercise, DASH diet  Leg weakness, bilateral Her dizziness has improved, however she continues to have some slight weakness in both legs.  Suspect this is mostly related to underlying deconditioning and would recommend exercise regimen, working with physical therapy to improve strength, balance, coordination, reduce risk of fall Referral placed to physical therapy downstairs, she is a member at [employer] as well and feel that this would be a good transition in regards to  working with physical therapy and being more specifically provided with home exercise program related to this  Return in about 2 months (around 03/11/2022) for HTN, leg weakness.  We will plan for follow-up in about 2 months to monitor progress with physical therapy, review blood pressure   ___________________________________________ Osmin Welz de [place], MD, ABFM, CAQSM Primary Care and [place]

## 2022-02-12 NOTE — Therapy (Signed)
OUTPATIENT PHYSICAL THERAPY LOWER EXTREMITY EVALUATION   Patient Name: Dana Finley MRN: [id number] DOB:[birth year], 66 y.o., female Today's Date: 02/14/2022   PT End of Session - 02/13/22 1426     Visit Number 1    Number of Visits 12    Date for PT Re-Evaluation 03/27/22    PT Start Time 9449    PT Stop Time 1400    PT Time Calculation (min) 53 min    Activity Tolerance Patient tolerated treatment well    Behavior During Therapy WFL for tasks assessed/performed             Past Medical History:  Diagnosis Date   ADD (attention deficit disorder)    Anxiety    Depression    Diverticula of colon    Hemorrhoids    History of colonic polyps hyperplastic   Hypertension    Hypothyroidism    IBS (irritable bowel syndrome)    Insomnia, persistent    Migraine headache without aura    lumbosacral disc desease   Osteopenia    Serrated adenoma of colon 06/2012   Weight loss    Past Surgical History:  Procedure Laterality Date   ABDOMINAL HYSTERECTOMY     1996   APPENDECTOMY     CATARACT EXTRACTION     bilateral   COLONOSCOPY     EYE SURGERY     muscle surgery; has had approx 18 surgeries on both eyes   POLYPECTOMY     Patient Active Problem List   Diagnosis Date Noted   Leg weakness, bilateral 01/09/2022   Fall 12/18/2021   Skin lesion 11/26/2021   Pain of left hip joint 09/05/2021   Chronic kidney disease 08/27/2021   Hypothyroidism 08/27/2021   Bilateral high frequency sensorineural hearing loss 07/05/2019   Hallux rigidus of right foot 03/24/2018   Acute upper respiratory infection 05/31/2016   Multinodular goiter 02/27/2016   Family history of colon cancer 08/31/2014   Chalazion of right lower eyelid 10/08/2012   Meibomian gland disease 10/08/2012   Hashimoto's thyroiditis 07/10/2011   RHINITIS 12/27/2009   NUMBNESS 12/27/2009   PERIPHERAL NEUROPATHY, LOWER EXTREMITY, 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   ACHILLES TENDINITIS 12/19/2009   HERNIATED LUMBOSACRAL DISC  12/07/2009   [place] DISEASE, LUMBOSACRAL SPINE 12/07/2009   ESSENTIAL HYPERTENSION, BENIGN 10/08/2007   IRRITABLE BOWEL SYNDROME 09/09/2007   DIARRHEA 09/09/2007   WEIGHT LOSS 08/20/2007   ANXIETY DEPRESSION 08/12/2007   INSOMNIA, CHRONIC 06/30/2007   OSTEOPENIA 06/30/2007   ADD (attention deficit disorder) 12/19/2006   Migraines 12/19/2006   COLONIC POLYPS, HYPERPLASTIC 11/19/2005   Colon polyps 11/19/2005    PCP: de [place], Raymond J, MD   REFERRING PROVIDER: de [place], Raymond J, MD    REFERRING DIAG: (ICD-10-CM) - Leg weakness, bilateral  THERAPY DIAG:  Muscle weakness (generalized)  Other lack of coordination  Rationale for Evaluation and Treatment Rehabilitation  ONSET DATE: MD visit/order 01/09/2022 ; onset 1.5 years ago  SUBJECTIVE:   SUBJECTIVE STATEMENT: -Pt's husband is present.  -Pt began having issues with balance and strength 1.5 years ago which has progressively worsened.    -Pt saw MD on 01/09/2022.  MD ordered PT and note indicated to improve strength, balance, coordination, reduce risk of fall  Pt is a member at [employer] and participates in aquatic classes 2-4x/wk.  Pt also uses the TM.    -"My legs feel weak".  Pt has difficulty with performing sit to stand transfers especially with toilet transfers.  Pt has much difficulty with performing  transfers.  Pt states she has balance issues and has had some falls.  She thinks the positioning of her R toe which she had surgery for affects her balance.  Pt states she has back pain with household chores though doesn't feel limited due to her leg weakness.   Pt is unable to keep up with her son when she is walking the dog.   -Pt has had dizziness for the past 6-8 months though states it has improved since MD adjusted her meds.    PERTINENT HISTORY: Pt states she has arthritis in back and a Hx of bilat hip bursitis. Dizziness, Stage 3 kidney disease, Migraines, and osteopenia R great toe surgery approx 4-6 years ago.      PAIN:  Are you having pain? No  PRECAUTIONS: Other: Hx of back pain and hip bursitis  WEIGHT BEARING RESTRICTIONS No  FALLS:  Has patient fallen in last 6 months? Yes. Number of falls 3  LIVING ENVIRONMENT: Lives with: lives with their spouse Lives in: 2 story home Stairs: yes Has following equipment at home: Environmental consultant - 2 wheeled   PLOF: Independent  PATIENT GOALS improve strength in her legs and improve balance   OBJECTIVE:   DIAGNOSTIC FINDINGS: N/A  PATIENT SURVEYS:  LEFS 27/80  COGNITION:  Overall cognitive status: Within functional limits for tasks assessed      LOWER EXTREMITY MMT:  MMT Right eval Left eval  Hip flexion 4/5 4-/5  Hip extension <3/5 <3/5  Hip abduction 3+/5 3+/5  Hip adduction    Hip internal rotation    Hip external rotation 4-/5 4-/5  Knee flexion Had HS cramping 4/5  Knee extension 4/5 4-/5  Ankle dorsiflexion 4/5 4/5  Ankle plantarflexion Weak, tested in sitting  Weak, tested in sitting   Ankle inversion    Ankle eversion     (Blank rows = not tested)  Pt was unable to hold smoothly against resistance having jerky movements.   BALANCE:  Pt able to stand with FT x 30 secs independently. Pt able to stand with FA with EC x20 sec independently   Pt unable to perform tandem stance without UE support.  FUNCTIONAL TESTS:  Pt able to perform sit to stand transfers without UE's though has difficulty.   (From chair without UE's) 5 times sit to stand: 27 sec  GAIT: Assistive device utilized: None Comments: decreased gait speed.  Hesitant after standing up from chair and initiating her walking.     TODAY'S TREATMENT: See below for pt education.   PATIENT EDUCATION:  Education details:  POC, dx, objective findings, relevant anatomy and rationale of exercises.  PT answered pt and pt's husband's questions.  Person educated: Patient and Spouse Education method: Explanation Education comprehension: verbalized understanding and  needs further education   HOME EXERCISE PROGRAM: Will give next visit.   ASSESSMENT:  CLINICAL IMPRESSION: Patient is a 66 y.o. female with a dx of bilat leg weakness presenting to the clinic with significant muscle weakness in bilat LE's and balance deficits.  Pt has difficulty with her daily transfers and requires increased time to perform 5x sit to stand demonstrating functional LE weakness.  Pt states she has balance issues and thinks the positioning of her R toe which she had surgery for affects her balance.  Pt is unable to keep up with her son when he is walking the dog.  Pt should benefit from skilled PT services to address impairments and to improve overall function.  OBJECTIVE IMPAIRMENTS Abnormal gait, decreased activity tolerance, decreased balance, decreased coordination, decreased endurance, decreased mobility, difficulty walking, and decreased strength.   ACTIVITY LIMITATIONS squatting, stairs, and transfers  PARTICIPATION LIMITATIONS: community activity and walking the dog  PERSONAL FACTORS 3+ comorbidities: arthritis in back, hip bursitis, R great toe surgery  are also affecting patient's functional outcome.   REHAB POTENTIAL: Good  CLINICAL DECISION MAKING: Evolving/moderate complexity  EVALUATION COMPLEXITY: Moderate   GOALS:   SHORT TERM GOALS: Target date:  03/06/2022 Pt will be independent and compliant with HEP for improved strength, balance, and functional mobility. Baseline: Goal status: INITIAL  2.  Pt will perform 5x STS test in no > 17 sec for improved functional LE strength and performance of sit/stand transfers. Baseline:  Goal status: INITIAL  3.  Pt will report at least a 25% improvement in her balance and stability overall.  Baseline:  Goal status: INITIAL    LONG TERM GOALS: Target date: 03/27/2022  Pt will be able to perform sit/stand transfers including toilet transfers without difficulty.  Baseline:  Goal status: INITIAL  2.   Pt will demo improved bilat hip strength to at least 4+/5 and bilat knee strength to 5/5 MMT for improved performance of functional mobility.   Baseline:  Goal status: INITIAL  3.  Pt will perform 5x STS test in no > 12 seconds for improved functional LE strength.  Baseline:  Goal status: INITIAL  4.  Pt will be able to perform tandem stance for 20 sec without UE assist independently for improve balance and proprioception. Baseline:  Goal status: INITIAL  5.  Pt will demo improved quality of gait including increased gait speed.  Baseline:  Goal status: INITIAL  6.  Pt will report at least a 60-70% improvement in her balance and stability with daily mobility.  Baseline:  Goal status: INITIAL   PLAN: PT FREQUENCY: 2x/week  PT DURATION: 6 weeks  PLANNED INTERVENTIONS: Therapeutic exercises, Therapeutic activity, Neuromuscular re-education, Balance training, Gait training, Patient/Family education, Stair training, Aquatic Therapy, Electrical stimulation, Cryotherapy, Moist heat, Manual therapy, and Re-evaluation  PLAN FOR NEXT SESSION: Establish HEP.  LE strengthening and balance training.  Perform SLS.  Pt may also benefit from aquatic therapy   Selinda Michaels III PT, DPT 02/14/22 6:18 PM

## 2022-02-13 ENCOUNTER — Encounter (HOSPITAL_BASED_OUTPATIENT_CLINIC_OR_DEPARTMENT_OTHER): Payer: Self-pay | Admitting: Physical Therapy

## 2022-02-13 ENCOUNTER — Ambulatory Visit (HOSPITAL_BASED_OUTPATIENT_CLINIC_OR_DEPARTMENT_OTHER): Payer: Medicare Other | Attending: Family Medicine | Admitting: Physical Therapy

## 2022-02-13 DIAGNOSIS — R29898 Other symptoms and signs involving the musculoskeletal system: Secondary | ICD-10-CM | POA: Diagnosis not present

## 2022-02-13 DIAGNOSIS — R278 Other lack of coordination: Secondary | ICD-10-CM

## 2022-02-13 DIAGNOSIS — M6281 Muscle weakness (generalized): Secondary | ICD-10-CM

## 2022-02-15 ENCOUNTER — Ambulatory Visit (HOSPITAL_BASED_OUTPATIENT_CLINIC_OR_DEPARTMENT_OTHER): Payer: Medicare Other | Admitting: Physical Therapy

## 2022-02-15 ENCOUNTER — Encounter (HOSPITAL_BASED_OUTPATIENT_CLINIC_OR_DEPARTMENT_OTHER): Payer: Self-pay | Admitting: Physical Therapy

## 2022-02-15 DIAGNOSIS — M6281 Muscle weakness (generalized): Secondary | ICD-10-CM

## 2022-02-15 DIAGNOSIS — R29898 Other symptoms and signs involving the musculoskeletal system: Secondary | ICD-10-CM | POA: Diagnosis not present

## 2022-02-15 DIAGNOSIS — R278 Other lack of coordination: Secondary | ICD-10-CM

## 2022-02-15 NOTE — Therapy (Signed)
OUTPATIENT PHYSICAL THERAPY TREATMENT NOTE   Patient Name: Dana Finley MRN: [id number] DOB:07-10-1956, 66 y.o., female Today's Date: 02/15/2022    END OF SESSION:   PT End of Session - 02/15/22 1333     Visit Number 2    Number of Visits 12    Date for PT Re-Evaluation 03/27/22    Authorization Type MCR A and B    PT Start Time 3536    PT Stop Time 1322    PT Time Calculation (min) 48 min    Activity Tolerance Patient tolerated treatment well    Behavior During Therapy WFL for tasks assessed/performed             Past Medical History:  Diagnosis Date   ADD (attention deficit disorder)    Anxiety    Depression    Diverticula of colon    Hemorrhoids    History of colonic polyps hyperplastic   Hypertension    Hypothyroidism    IBS (irritable bowel syndrome)    Insomnia, persistent    Migraine headache without aura    lumbosacral disc desease   Osteopenia    Serrated adenoma of colon 06/2012   Weight loss    Past Surgical History:  Procedure Laterality Date   ABDOMINAL HYSTERECTOMY     1996   APPENDECTOMY     CATARACT EXTRACTION     bilateral   COLONOSCOPY     EYE SURGERY     muscle surgery; has had approx 18 surgeries on both eyes   POLYPECTOMY     Patient Active Problem List   Diagnosis Date Noted   Leg weakness, bilateral 01/09/2022   Fall 12/18/2021   Skin lesion 11/26/2021   Pain of left hip joint 09/05/2021   Chronic kidney disease 08/27/2021   Hypothyroidism 08/27/2021   Bilateral high frequency sensorineural hearing loss 07/05/2019   Hallux rigidus of right foot 03/24/2018   Acute upper respiratory infection 05/31/2016   Multinodular goiter 02/27/2016   Family history of colon cancer 08/31/2014   Chalazion of right lower eyelid 10/08/2012   Meibomian gland disease 10/08/2012   Hashimoto's thyroiditis 07/10/2011   RHINITIS 12/27/2009   NUMBNESS 12/27/2009   PERIPHERAL NEUROPATHY, LOWER EXTREMITY, 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   ACHILLES TENDINITIS  12/19/2009   HERNIATED LUMBOSACRAL DISC 12/07/2009   [place] DISEASE, LUMBOSACRAL SPINE 12/07/2009   ESSENTIAL HYPERTENSION, BENIGN 10/08/2007   IRRITABLE BOWEL SYNDROME 09/09/2007   DIARRHEA 09/09/2007   WEIGHT LOSS 08/20/2007   ANXIETY DEPRESSION 08/12/2007   INSOMNIA, CHRONIC 06/30/2007   OSTEOPENIA 06/30/2007   ADD (attention deficit disorder) 12/19/2006   Migraines 12/19/2006   COLONIC POLYPS, HYPERPLASTIC 11/19/2005   Colon polyps 11/19/2005    PCP: de [place], Raymond J, MD    REFERRING PROVIDER: [place], Raymond J, MD     REFERRING DIAG: (ICD-10-CM) - Leg weakness, bilateral   THERAPY DIAG:  Muscle weakness (generalized)   Other lack of coordination   Rationale for Evaluation and Treatment Rehabilitation   ONSET DATE: MD visit/order 01/09/2022 ; onset 1.5 years ago   SUBJECTIVE:    SUBJECTIVE STATEMENT: -Pt saw MD on 01/09/2022.  MD ordered PT and note indicated to improve strength, balance, coordination, reduce risk of fall  Pt is a member at [employer] and participates in aquatic classes 2-4x/wk.  Pt also uses the TM.     -Pt has difficulty with performing sit to stand transfers especially with toilet transfers.  Pt has much difficulty with performing transfers.  Pt states she  has balance issues and has had some falls.  She thinks the positioning of her R toe which she had surgery for affects her balance.  Pt states she has back pain with household chores though doesn't feel limited due to her leg weakness.   Pt is unable to keep up with her son when he is walking the dog.    -Pt denies any adverse effects after prior Rx.  Pt states she has to sit for some household chores such as folding laundry due to her back pain but also may be due to her weakness/balance.  Pt states she ascends stairs very slowly and has to pull on banister.  If pt carries laundry upstairs, she has to touch each step with laundry basket.  Pt states she feels better walking with shoes and tries not to go  barefooted.   PERTINENT HISTORY: Pt states she has arthritis in back and a Hx of bilat hip bursitis. Dizziness, Stage 3 kidney disease, Migraines, and osteopenia R great toe surgery approx 4-6 years ago.      PAIN:  Are you having pain? No   PRECAUTIONS: Other: Hx of back pain and hip bursitis   WEIGHT BEARING RESTRICTIONS No   FALLS:  Has patient fallen in last 6 months? Yes. Number of falls 3   LIVING ENVIRONMENT: Lives with: lives with their spouse Lives in: 2 story home Stairs: yes Has following equipment at home: Walker - 2 wheeled     PLOF: Independent   PATIENT GOALS improve strength in her legs and improve balance     OBJECTIVE:    DIAGNOSTIC FINDINGS: N/A     TODAY'S TREATMENT: Therapeutic Exercise: Reviewed current function, pain level, and response to prior Rx. Pt performed:  Supine SLR 2x10 bilat  Supine bridge 2x10   Supine clams with RTB and GTB x 10 reps each  S/L hip abduction 2x10 reps bilat  Heel raises 2x10 reps Standing hip abd x 10 reps bilat Sit to stands from table without UE's 2x10 reps   Pt received a HEP handout and was educated in correct form and appropriate frequency.   Neuro Re-ed Activities: Standing with FT x 30 sec and with EC 2x30 sec SLS with light UE support 3x15 sec bilat Standing in a staggered stance 2x20 sec bilat without UE support Marching on airex 2x10      PATIENT EDUCATION:  Education details:  POC, dx, objective findings, relevant anatomy and rationale of exercises.  PT answered pt and pt's husband's questions.  Person educated: Patient and Spouse Education method: Explanation Education comprehension: verbalized understanding and needs further education     HOME EXERCISE PROGRAM: Access Code: [email or web address] URL: [email or web address] Date: 02/15/2022 Prepared by: Ronny Flurry  Exercises - Supine Active Straight Leg Raise  - 1 x daily - 5-6 x weekly - 2 sets - 10 reps - Supine Bridge  - 1 x  daily - 5-6 x weekly - 2 sets - 10 reps - Hooklying Clamshell with Resistance  - 1 x daily - 4 x weekly - 2 sets - 10 reps - Standing Heel Raise with Support  - 1 x daily - 5-6 x weekly - 2 sets - 10 reps - Romberg Stance  - 1 x daily - 7 x weekly - 3 reps - 30 seconds hold -Staggered stance 1x daily -6-7x weekly - 2 reps - 20 second hold  ASSESSMENT:   CLINICAL IMPRESSION: PT established HEP today.  Pt was given a HEP handout and  demonstrates good understanding of HEP.  Pt was instructed in exercises with cuing for correct form and positioning.  Pt gives good effort with all exercises.  She performed exercises well with cuing and instruction for correct form.  Pt has weakness in glute med and had limited hip abduction AROM in S/L'ing.  Pt is unable to perform SLS without UE support.  Pt able to stand with FT with EO and EC very well without LOB and without UE support.  She responded well to Rx having no c/o's after Rx.  Pt should benefit from skilled PT services to address impairments and to improve overall function.        OBJECTIVE IMPAIRMENTS Abnormal gait, decreased activity tolerance, decreased balance, decreased coordination, decreased endurance, decreased mobility, difficulty walking, and decreased strength.    ACTIVITY LIMITATIONS squatting, stairs, and transfers   PARTICIPATION LIMITATIONS: community activity and walking the dog   PERSONAL FACTORS 3+ comorbidities: arthritis in back, hip bursitis, R great toe surgery  are also affecting patient's functional outcome.    REHAB POTENTIAL: Good   CLINICAL DECISION MAKING: Evolving/moderate complexity   EVALUATION COMPLEXITY: Moderate     GOALS:     SHORT TERM GOALS: Target date:  03/06/2022 Pt will be independent and compliant with HEP for improved strength, balance, and functional mobility. Baseline: Goal status: INITIAL   2.  Pt will perform 5x STS test in no > 17 sec for improved functional LE strength and performance of  sit/stand transfers. Baseline:  Goal status: INITIAL   3.  Pt will report at least a 25% improvement in her balance and stability overall.  Baseline:  Goal status: INITIAL       LONG TERM GOALS: Target date: 03/27/2022   Pt will be able to perform sit/stand transfers including toilet transfers without difficulty.  Baseline:  Goal status: INITIAL   2.  Pt will demo improved bilat hip strength to at least 4+/5 and bilat knee strength to 5/5 MMT for improved performance of functional mobility.   Baseline:  Goal status: INITIAL   3.  Pt will perform 5x STS test in no > 12 seconds for improved functional LE strength.  Baseline:  Goal status: INITIAL   4.  Pt will be able to perform tandem stance for 20 sec without UE assist independently for improve balance and proprioception. Baseline:  Goal status: INITIAL   5.  Pt will demo improved quality of gait including increased gait speed.  Baseline:  Goal status: INITIAL   6.  Pt will report at least a 60-70% improvement in her balance and stability with daily mobility.  Baseline:  Goal status: INITIAL     PLAN: PT FREQUENCY: 2x/week   PT DURATION: 6 weeks   PLANNED INTERVENTIONS: Therapeutic exercises, Therapeutic activity, Neuromuscular re-education, Balance training, Gait training, Patient/Family education, Stair training, Aquatic Therapy, Electrical stimulation, Cryotherapy, Moist heat, Manual therapy, and Re-evaluation   PLAN FOR NEXT SESSION: Review and perform HEP.  LE strengthening and balance training.  Pt may also benefit from aquatic therapy      Selinda Michaels III PT, DPT 02/15/22 8:33 PM

## 2022-02-18 ENCOUNTER — Ambulatory Visit (HOSPITAL_BASED_OUTPATIENT_CLINIC_OR_DEPARTMENT_OTHER): Payer: Medicare Other | Admitting: Physical Therapy

## 2022-02-18 ENCOUNTER — Ambulatory Visit (HOSPITAL_BASED_OUTPATIENT_CLINIC_OR_DEPARTMENT_OTHER): Payer: Medicare Other

## 2022-02-18 ENCOUNTER — Encounter (HOSPITAL_BASED_OUTPATIENT_CLINIC_OR_DEPARTMENT_OTHER): Payer: Self-pay | Admitting: Physical Therapy

## 2022-02-18 DIAGNOSIS — M6281 Muscle weakness (generalized): Secondary | ICD-10-CM

## 2022-02-18 DIAGNOSIS — R278 Other lack of coordination: Secondary | ICD-10-CM

## 2022-02-18 DIAGNOSIS — Z0000 Encounter for general adult medical examination without abnormal findings: Secondary | ICD-10-CM

## 2022-02-18 DIAGNOSIS — R29898 Other symptoms and signs involving the musculoskeletal system: Secondary | ICD-10-CM | POA: Diagnosis not present

## 2022-02-18 DIAGNOSIS — N1831 Chronic kidney disease, stage 3a: Secondary | ICD-10-CM

## 2022-02-18 NOTE — Therapy (Signed)
OUTPATIENT PHYSICAL THERAPY TREATMENT NOTE   Patient Name: Dana Finley MRN: [id number] DOB:1955-10-06, 66 y.o., female Today's Date: 02/18/2022    END OF SESSION:   PT End of Session - 02/18/22 1310     Visit Number 3    Number of Visits 12    Date for PT Re-Evaluation 03/27/22    Authorization Type MCR A and B    PT Start Time 1306    PT Stop Time 1348    PT Time Calculation (min) 42 min    Activity Tolerance Patient tolerated treatment well    Behavior During Therapy WFL for tasks assessed/performed             Past Medical History:  Diagnosis Date   ADD (attention deficit disorder)    Anxiety    Depression    Diverticula of colon    Hemorrhoids    History of colonic polyps hyperplastic   Hypertension    Hypothyroidism    IBS (irritable bowel syndrome)    Insomnia, persistent    Migraine headache without aura    lumbosacral disc desease   Osteopenia    Serrated adenoma of colon 06/2012   Weight loss    Past Surgical History:  Procedure Laterality Date   ABDOMINAL HYSTERECTOMY     1996   APPENDECTOMY     CATARACT EXTRACTION     bilateral   COLONOSCOPY     EYE SURGERY     muscle surgery; has had approx 18 surgeries on both eyes   POLYPECTOMY     Patient Active Problem List   Diagnosis Date Noted   Leg weakness, bilateral 01/09/2022   Fall 12/18/2021   Skin lesion 11/26/2021   Pain of left hip joint 09/05/2021   Chronic kidney disease 08/27/2021   Hypothyroidism 08/27/2021   Bilateral high frequency sensorineural hearing loss 07/05/2019   Hallux rigidus of right foot 03/24/2018   Acute upper respiratory infection 05/31/2016   Multinodular goiter 02/27/2016   Family history of colon cancer 08/31/2014   Chalazion of right lower eyelid 10/08/2012   Meibomian gland disease 10/08/2012   Hashimoto's thyroiditis 07/10/2011   RHINITIS 12/27/2009   NUMBNESS 12/27/2009   PERIPHERAL NEUROPATHY, LOWER EXTREMITY, 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   ACHILLES TENDINITIS  12/19/2009   HERNIATED LUMBOSACRAL DISC 12/07/2009   [place] DISEASE, LUMBOSACRAL SPINE 12/07/2009   ESSENTIAL HYPERTENSION, BENIGN 10/08/2007   IRRITABLE BOWEL SYNDROME 09/09/2007   DIARRHEA 09/09/2007   WEIGHT LOSS 08/20/2007   ANXIETY DEPRESSION 08/12/2007   INSOMNIA, CHRONIC 06/30/2007   OSTEOPENIA 06/30/2007   ADD (attention deficit disorder) 12/19/2006   Migraines 12/19/2006   COLONIC POLYPS, HYPERPLASTIC 11/19/2005   Colon polyps 11/19/2005    PCP: [place], Raymond J, MD    REFERRING PROVIDER: [place], Raymond J, MD     REFERRING DIAG: (ICD-10-CM) - Leg weakness, bilateral   THERAPY DIAG:  Muscle weakness (generalized)   Other lack of coordination   Rationale for Evaluation and Treatment Rehabilitation   ONSET DATE: MD visit/order 01/09/2022 ; onset 1.5 years ago   SUBJECTIVE:    SUBJECTIVE STATEMENT: -Pt saw MD on 01/09/2022.  MD ordered PT and note indicated to improve strength, balance, coordination, reduce risk of fall  Pt is a member at [employer] and participates in aquatic classes 2-4x/wk.  Pt also uses the TM.     -Pt has difficulty with performing sit to stand transfers especially with toilet transfers.  Pt has much difficulty with performing transfers.  Pt states she  has balance issues.  Pt is unable to keep up with her son when he is walking the dog.  Pt states she has to sit for some household chores such as folding laundry due to her back pain but also may be due to her weakness/balance.  Pt states she ascends stairs very slowly and has to pull on banister.  If pt carries laundry upstairs, she has to touch each step with laundry basket.    - Pt denies any adverse effects after prior Rx.  Pt reports compliance with HEP.     PERTINENT HISTORY: Pt states she has arthritis in back and a Hx of bilat hip bursitis. Dizziness, Stage 3 kidney disease, Migraines, and osteopenia R great toe surgery approx 4-6 years ago.      PAIN:  Are you having pain? No    PRECAUTIONS: Other: Hx of back pain and hip bursitis   WEIGHT BEARING RESTRICTIONS No   FALLS:  Has patient fallen in last 6 months? Yes. Number of falls 3   LIVING ENVIRONMENT: Lives with: lives with their spouse Lives in: 2 story home Stairs: yes Has following equipment at home: Walker - 2 wheeled     PLOF: Independent   PATIENT GOALS improve strength in her legs and improve balance     OBJECTIVE:    DIAGNOSTIC FINDINGS: N/A     TODAY'S TREATMENT: Therapeutic Exercise: Reviewed current function, pain level, and response to prior Rx. Pt performed:  Supine SLR 2x10 bilat  Supine bridge 2x10   Supine clams with GTB 2 x 10 reps each  S/L hip abduction 2x10 reps bilat  Heel raises 2x10 reps Standing hip abd x 10 reps bilat Sit to stands from table without UE's 2x10 reps  Shuttle press 3 25's 2x10 reps  Neuro Re-ed Activities: Standing with FT with EC 2x30 sec Standing on airex with FT 2x30 sec, FA with EC x 20 sec SLS with light UE support 3x15 sec bilat Standing in a staggered stance x20 sec bilat without UE support Tandem stance with UE support and occasional min assist 2x20 seconds Marching on airex x20 without Ue's  Step ups on airex approx 12-15 reps bilat     PATIENT EDUCATION:  Education details:  POC, dx, rationale of exercises, and exercise form. Person educated: Patient and Spouse Education method: Explanation Education comprehension: verbalized understanding and needs further education     HOME EXERCISE PROGRAM: Access Code: [email or web address] URL: [email or web address] Date: 02/15/2022 Prepared by: Ronny Flurry  Exercises - Supine Active Straight Leg Raise  - 1 x daily - 5-6 x weekly - 2 sets - 10 reps - Supine Bridge  - 1 x daily - 5-6 x weekly - 2 sets - 10 reps - Hooklying Clamshell with Resistance  - 1 x daily - 4 x weekly - 2 sets - 10 reps - Standing Heel Raise with Support  - 1 x daily - 5-6 x weekly - 2 sets - 10 reps - Romberg  Stance  - 1 x daily - 7 x weekly - 3 reps - 30 seconds hold -Staggered stance 1x daily -6-7x weekly - 2 reps - 20 second hold  ASSESSMENT:   CLINICAL IMPRESSION: PT reviewed HEP and pt performed HEP.  Pt performed exercises well with cuing for correct form and positioning.  Pt has balance deficits though performed balance exercises well.  She required instruction for correct form and positioning with S/L'ing hip abd and had some difficulty with hip abduction.   She  responded well to Rx having no c/o's after Rx.  Pt should benefit from continued skilled PT services to address impairments and to improve overall function.        OBJECTIVE IMPAIRMENTS Abnormal gait, decreased activity tolerance, decreased balance, decreased coordination, decreased endurance, decreased mobility, difficulty walking, and decreased strength.    ACTIVITY LIMITATIONS squatting, stairs, and transfers   PARTICIPATION LIMITATIONS: community activity and walking the dog   PERSONAL FACTORS 3+ comorbidities: arthritis in back, hip bursitis, R great toe surgery  are also affecting patient's functional outcome.    REHAB POTENTIAL: Good   CLINICAL DECISION MAKING: Evolving/moderate complexity   EVALUATION COMPLEXITY: Moderate     GOALS:     SHORT TERM GOALS: Target date:  03/06/2022 Pt will be independent and compliant with HEP for improved strength, balance, and functional mobility. Baseline: Goal status: INITIAL   2.  Pt will perform 5x STS test in no > 17 sec for improved functional LE strength and performance of sit/stand transfers. Baseline:  Goal status: INITIAL   3.  Pt will report at least a 25% improvement in her balance and stability overall.  Baseline:  Goal status: INITIAL       LONG TERM GOALS: Target date: 03/27/2022   Pt will be able to perform sit/stand transfers including toilet transfers without difficulty.  Baseline:  Goal status: INITIAL   2.  Pt will demo improved bilat hip strength  to at least 4+/5 and bilat knee strength to 5/5 MMT for improved performance of functional mobility.   Baseline:  Goal status: INITIAL   3.  Pt will perform 5x STS test in no > 12 seconds for improved functional LE strength.  Baseline:  Goal status: INITIAL   4.  Pt will be able to perform tandem stance for 20 sec without UE assist independently for improve balance and proprioception. Baseline:  Goal status: INITIAL   5.  Pt will demo improved quality of gait including increased gait speed.  Baseline:  Goal status: INITIAL   6.  Pt will report at least a 60-70% improvement in her balance and stability with daily mobility.  Baseline:  Goal status: INITIAL     PLAN: PT FREQUENCY: 2x/week   PT DURATION: 6 weeks   PLANNED INTERVENTIONS: Therapeutic exercises, Therapeutic activity, Neuromuscular re-education, Balance training, Gait training, Patient/Family education, Stair training, Aquatic Therapy, Electrical stimulation, Cryotherapy, Moist heat, Manual therapy, and Re-evaluation   PLAN FOR NEXT SESSION: Review and perform HEP.  LE strengthening and balance training.  Pt may also benefit from aquatic therapy      Selinda Michaels III PT, DPT 02/18/22 4:26 PM

## 2022-02-19 LAB — COMPREHENSIVE METABOLIC PANEL
ALT: 10 IU/L (ref 0–32)
AST: 17 IU/L (ref 0–40)
Albumin/Globulin Ratio: 1.9 (ref 1.2–2.2)
Albumin: 4.5 g/dL (ref 3.9–4.9)
Alkaline Phosphatase: 45 IU/L (ref 44–121)
BUN/Creatinine Ratio: 16 (ref 12–28)
BUN: 18 mg/dL (ref 8–27)
Bilirubin Total: 0.4 mg/dL (ref 0.0–1.2)
CO2: 22 mmol/L (ref 20–29)
Calcium: 9.6 mg/dL (ref 8.7–10.3)
Chloride: 104 mmol/L (ref 96–106)
Creatinine, Ser: 1.12 mg/dL — ABNORMAL HIGH (ref 0.57–1.00)
Globulin, Total: 2.4 g/dL (ref 1.5–4.5)
Glucose: 99 mg/dL (ref 70–99)
Potassium: 4.4 mmol/L (ref 3.5–5.2)
Sodium: 141 mmol/L (ref 134–144)
Total Protein: 6.9 g/dL (ref 6.0–8.5)
eGFR: 54 mL/min/{1.73_m2} — ABNORMAL LOW (ref >59)

## 2022-02-19 LAB — CBC WITH DIFFERENTIAL/PLATELET
Basophils Absolute: 0.1 10*3/uL (ref 0.0–0.2)
Basos: 1 %
EOS (ABSOLUTE): 0.2 10*3/uL (ref 0.0–0.4)
Eos: 3 %
Hematocrit: 41.6 % (ref 34.0–46.6)
Hemoglobin: 14.3 g/dL (ref 11.1–15.9)
Immature Grans (Abs): 0 10*3/uL (ref 0.0–0.1)
Immature Granulocytes: 0 %
Lymphocytes Absolute: 2.6 10*3/uL (ref 0.7–3.1)
Lymphs: 43 %
MCH: 31.6 pg (ref 26.6–33.0)
MCHC: 34.4 g/dL (ref 31.5–35.7)
MCV: 92 fL (ref 79–97)
Monocytes Absolute: 0.4 10*3/uL (ref 0.1–0.9)
Monocytes: 7 %
Neutrophils Absolute: 2.9 10*3/uL (ref 1.4–7.0)
Neutrophils: 46 %
Platelets: 269 10*3/uL (ref 150–450)
RBC: 4.52 x10E6/uL (ref 3.77–5.28)
RDW: 11.9 % (ref 11.7–15.4)
WBC: 6.2 10*3/uL (ref 3.4–10.8)

## 2022-02-19 LAB — LIPID PANEL
Chol/HDL Ratio: 3 ratio (ref 0.0–4.4)
Cholesterol, Total: 223 mg/dL — ABNORMAL HIGH (ref 100–199)
HDL: 74 mg/dL (ref >39)
LDL Chol Calc (NIH): 128 mg/dL — ABNORMAL HIGH (ref 0–99)
Triglycerides: 123 mg/dL (ref 0–149)
VLDL Cholesterol Cal: 21 mg/dL (ref 5–40)

## 2022-02-22 ENCOUNTER — Ambulatory Visit (HOSPITAL_BASED_OUTPATIENT_CLINIC_OR_DEPARTMENT_OTHER): Payer: Medicare Other | Admitting: Physical Therapy

## 2022-02-22 ENCOUNTER — Encounter (HOSPITAL_BASED_OUTPATIENT_CLINIC_OR_DEPARTMENT_OTHER): Payer: Self-pay | Admitting: Physical Therapy

## 2022-02-22 DIAGNOSIS — R29898 Other symptoms and signs involving the musculoskeletal system: Secondary | ICD-10-CM | POA: Diagnosis not present

## 2022-02-22 DIAGNOSIS — R278 Other lack of coordination: Secondary | ICD-10-CM

## 2022-02-22 DIAGNOSIS — M6281 Muscle weakness (generalized): Secondary | ICD-10-CM

## 2022-02-22 NOTE — Therapy (Signed)
OUTPATIENT PHYSICAL THERAPY TREATMENT NOTE   Patient Name: Dana Finley MRN: [id number] DOB:03-28-1956, 66 y.o., female Today's Date: 02/22/2022    END OF SESSION:   PT End of Session - 02/22/22 1333     Visit Number 4    Number of Visits 12    Date for PT Re-Evaluation 03/27/22    Authorization Type MCR A and B    PT Start Time 1325    PT Stop Time 1405    PT Time Calculation (min) 40 min    Activity Tolerance Patient tolerated treatment well    Behavior During Therapy WFL for tasks assessed/performed              Past Medical History:  Diagnosis Date   ADD (attention deficit disorder)    Anxiety    Depression    Diverticula of colon    Hemorrhoids    History of colonic polyps hyperplastic   Hypertension    Hypothyroidism    IBS (irritable bowel syndrome)    Insomnia, persistent    Migraine headache without aura    lumbosacral disc desease   Osteopenia    Serrated adenoma of colon 06/2012   Weight loss    Past Surgical History:  Procedure Laterality Date   ABDOMINAL HYSTERECTOMY     1996   APPENDECTOMY     CATARACT EXTRACTION     bilateral   COLONOSCOPY     EYE SURGERY     muscle surgery; has had approx 18 surgeries on both eyes   POLYPECTOMY     Patient Active Problem List   Diagnosis Date Noted   Leg weakness, bilateral 01/09/2022   Fall 12/18/2021   Skin lesion 11/26/2021   Pain of left hip joint 09/05/2021   Chronic kidney disease 08/27/2021   Hypothyroidism 08/27/2021   Bilateral high frequency sensorineural hearing loss 07/05/2019   Hallux rigidus of right foot 03/24/2018   Acute upper respiratory infection 05/31/2016   Multinodular goiter 02/27/2016   Family history of colon cancer 08/31/2014   Chalazion of right lower eyelid 10/08/2012   Meibomian gland disease 10/08/2012   Hashimoto's thyroiditis 07/10/2011   RHINITIS 12/27/2009   NUMBNESS 12/27/2009   PERIPHERAL NEUROPATHY, LOWER EXTREMITY, 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   ACHILLES TENDINITIS  12/19/2009   HERNIATED LUMBOSACRAL DISC 12/07/2009   [place] DISEASE, LUMBOSACRAL SPINE 12/07/2009   ESSENTIAL HYPERTENSION, BENIGN 10/08/2007   IRRITABLE BOWEL SYNDROME 09/09/2007   DIARRHEA 09/09/2007   WEIGHT LOSS 08/20/2007   ANXIETY DEPRESSION 08/12/2007   INSOMNIA, CHRONIC 06/30/2007   OSTEOPENIA 06/30/2007   ADD (attention deficit disorder) 12/19/2006   Migraines 12/19/2006   COLONIC POLYPS, HYPERPLASTIC 11/19/2005   Colon polyps 11/19/2005    PCP: de [place], Raymond J, MD    REFERRING PROVIDER: de [place], Raymond J, MD     REFERRING DIAG: (ICD-10-CM) - Leg weakness, bilateral   THERAPY DIAG:  Muscle weakness (generalized)   Other lack of coordination   Rationale for Evaluation and Treatment Rehabilitation   ONSET DATE: MD visit/order 01/09/2022 ; onset 1.5 years ago   SUBJECTIVE:    SUBJECTIVE STATEMENT: -Pt saw MD on 01/09/2022.  MD ordered PT and note indicated to improve strength, balance, coordination, reduce risk of fall  Pt is a member at [employer] and participates in aquatic classes 2-4x/wk.  Pt also uses the TM.     -Pt has difficulty with performing sit to stand transfers especially with toilet transfers.  Pt has much difficulty with performing transfers.  Pt states  she has balance issues.  Pt has to sit for some household chores such as folding laundry due to her back pain but also may be due to her weakness/balance.  Pt states she ascends stairs very slowly and has to pull on banister.   -Pt states she felt fine after prior Rx though didn't feel great the next day.  Pt states her legs have not felt well this week.  She c/o's of a heavy feeling in bilat LE's, "They (legs) just don't feel right".  Pt states she did some of her HEP this week.  Pt denies pain currently.  Pt states she had a dizzy spell yesterday.  Pt states she sees MD next week.     PERTINENT HISTORY: Pt states she has arthritis in back and a Hx of bilat hip bursitis. Dizziness, Stage 3 kidney  disease, Migraines, and osteopenia R great toe surgery approx 4-6 years ago.      PAIN:  Are you having pain? No   PRECAUTIONS: Other: Hx of back pain and hip bursitis   WEIGHT BEARING RESTRICTIONS No   FALLS:  Has patient fallen in last 6 months? Yes. Number of falls 3   LIVING ENVIRONMENT: Lives with: lives with their spouse Lives in: 2 story home Stairs: yes Has following equipment at home: Walker - 2 wheeled     PLOF: Independent   PATIENT GOALS improve strength in her legs and improve balance     OBJECTIVE:    DIAGNOSTIC FINDINGS: N/A     TODAY'S TREATMENT: Therapeutic Exercise: Reviewed current function, pain level, and response to prior Rx. Pt performed:  Nu-step bilat UE/LE's at L3 x 5 mins  Supine bridge 2x10   Supine clams with RTB 2 x 10 reps each  Heel raises 2x10 reps Standing hip abd x 10 reps bilat Sit to stands from table without UE's 2x10 reps  Shuttle press 3 25's x6 reps and 2 25's 2x10 reps  Neuro Re-ed Activities: SLS with light UE support 3x15 sec bilat Marching on airex x20 without Ue's  Step ups on airex approx 10 reps bilat Ambulating over 3 hurdles with step to gait pattern with SBA/CGA rail at side and occasional UE support     PATIENT EDUCATION:  Education details:  POC, dx, rationale of exercises, and exercise form. Person educated: Patient and Spouse Education method: Explanation Education comprehension: verbalized understanding and needs further education     HOME EXERCISE PROGRAM: Access Code: [email or web address] URL: [email or web address] Date: 02/15/2022 Prepared by: Ronny Flurry  Exercises - Supine Active Straight Leg Raise  - 1 x daily - 5-6 x weekly - 2 sets - 10 reps - Supine Bridge  - 1 x daily - 5-6 x weekly - 2 sets - 10 reps - Hooklying Clamshell with Resistance  - 1 x daily - 4 x weekly - 2 sets - 10 reps - Standing Heel Raise with Support  - 1 x daily - 5-6 x weekly - 2 sets - 10 reps - Romberg Stance   - 1 x daily - 7 x weekly - 3 reps - 30 seconds hold -Staggered stance 1x daily -6-7x weekly - 2 reps - 20 second hold  ASSESSMENT:   CLINICAL IMPRESSION: Pt presents to Rx stating her legs have not had a good week.  She c/o's of her legs feeling heavy.  Pt performed exercises well with cuing for correct form and positioning.  She had good tolerance with exercises though was fatigued.  Progressed balance activities  with stepping over hurdles and Pt had difficulty.  She did have unsteadiness with walking over hurdles and only performed a step to gait pattern.  She used  the rail occasionally for support.  Pt was fatigued after Rx and stated her legs felt a little heavier.  Pt should benefit from continued skilled PT services to address impairments and to improve overall function.        OBJECTIVE IMPAIRMENTS Abnormal gait, decreased activity tolerance, decreased balance, decreased coordination, decreased endurance, decreased mobility, difficulty walking, and decreased strength.    ACTIVITY LIMITATIONS squatting, stairs, and transfers   PARTICIPATION LIMITATIONS: community activity and walking the dog   PERSONAL FACTORS 3+ comorbidities: arthritis in back, hip bursitis, R great toe surgery  are also affecting patient's functional outcome.    REHAB POTENTIAL: Good   CLINICAL DECISION MAKING: Evolving/moderate complexity   EVALUATION COMPLEXITY: Moderate     GOALS:     SHORT TERM GOALS: Target date:  03/06/2022 Pt will be independent and compliant with HEP for improved strength, balance, and functional mobility. Baseline: Goal status: INITIAL   2.  Pt will perform 5x STS test in no > 17 sec for improved functional LE strength and performance of sit/stand transfers. Baseline:  Goal status: INITIAL   3.  Pt will report at least a 25% improvement in her balance and stability overall.  Baseline:  Goal status: INITIAL       LONG TERM GOALS: Target date: 03/27/2022   Pt will be able  to perform sit/stand transfers including toilet transfers without difficulty.  Baseline:  Goal status: INITIAL   2.  Pt will demo improved bilat hip strength to at least 4+/5 and bilat knee strength to 5/5 MMT for improved performance of functional mobility.   Baseline:  Goal status: INITIAL   3.  Pt will perform 5x STS test in no > 12 seconds for improved functional LE strength.  Baseline:  Goal status: INITIAL   4.  Pt will be able to perform tandem stance for 20 sec without UE assist independently for improve balance and proprioception. Baseline:  Goal status: INITIAL   5.  Pt will demo improved quality of gait including increased gait speed.  Baseline:  Goal status: INITIAL   6.  Pt will report at least a 60-70% improvement in her balance and stability with daily mobility.  Baseline:  Goal status: INITIAL     PLAN: PT FREQUENCY: 2x/week   PT DURATION: 6 weeks   PLANNED INTERVENTIONS: Therapeutic exercises, Therapeutic activity, Neuromuscular re-education, Balance training, Gait training, Patient/Family education, Stair training, Aquatic Therapy, Electrical stimulation, Cryotherapy, Moist heat, Manual therapy, and Re-evaluation   PLAN FOR NEXT SESSION: continue with LE strengthening and balance training.  Pt may also benefit from aquatic therapy.  Pt sees MD next week      Selinda Michaels III PT, DPT 02/22/22 2:16 PM

## 2022-02-25 ENCOUNTER — Other Ambulatory Visit: Payer: Self-pay | Admitting: Nephrology

## 2022-02-25 ENCOUNTER — Other Ambulatory Visit (HOSPITAL_BASED_OUTPATIENT_CLINIC_OR_DEPARTMENT_OTHER): Payer: Self-pay

## 2022-02-25 ENCOUNTER — Ambulatory Visit (HOSPITAL_BASED_OUTPATIENT_CLINIC_OR_DEPARTMENT_OTHER): Payer: Medicare Other | Admitting: Physical Therapy

## 2022-02-26 ENCOUNTER — Ambulatory Visit (INDEPENDENT_AMBULATORY_CARE_PROVIDER_SITE_OTHER): Payer: Medicare Other | Admitting: Family Medicine

## 2022-02-26 ENCOUNTER — Encounter (HOSPITAL_BASED_OUTPATIENT_CLINIC_OR_DEPARTMENT_OTHER): Payer: Self-pay | Admitting: Family Medicine

## 2022-02-26 ENCOUNTER — Other Ambulatory Visit (HOSPITAL_BASED_OUTPATIENT_CLINIC_OR_DEPARTMENT_OTHER): Payer: Self-pay

## 2022-02-26 DIAGNOSIS — M8580 Other specified disorders of bone density and structure, unspecified site: Secondary | ICD-10-CM | POA: Diagnosis not present

## 2022-02-26 DIAGNOSIS — I10 Essential (primary) hypertension: Secondary | ICD-10-CM

## 2022-02-26 DIAGNOSIS — Z0000 Encounter for general adult medical examination without abnormal findings: Secondary | ICD-10-CM | POA: Diagnosis not present

## 2022-02-26 DIAGNOSIS — M8588 Other specified disorders of bone density and structure, other site: Secondary | ICD-10-CM | POA: Diagnosis not present

## 2022-02-26 DIAGNOSIS — Z23 Encounter for immunization: Secondary | ICD-10-CM

## 2022-02-26 DIAGNOSIS — S51801D Unspecified open wound of right forearm, subsequent encounter: Secondary | ICD-10-CM

## 2022-02-26 DIAGNOSIS — G43909 Migraine, unspecified, not intractable, without status migrainosus: Secondary | ICD-10-CM

## 2022-02-26 HISTORY — DX: Encounter for general adult medical examination without abnormal findings: Z00.00

## 2022-02-26 MED ORDER — HYDROCHLOROTHIAZIDE 12.5 MG PO TABS: 6.2500 mg | ORAL_TABLET | Freq: Every day | ORAL | 1 refills | Status: DC

## 2022-02-26 MED ORDER — ZOLMITRIPTAN 5 MG PO TABS
ORAL_TABLET | ORAL | 3 refills | Status: DC
  Filled 2022-02-26 – 2022-02-27 (×2): qty 36, 90d supply, fill #0
  Filled 2022-05-03: qty 36, 90d supply, fill #1
  Filled 2022-05-29: qty 36, 90d supply, fill #2

## 2022-02-26 NOTE — Assessment & Plan Note (Signed)
She has been having some increased issues in regards to her headaches, these of been occurring more frequently.  She wonders if this is related to changes in her blood pressure medication.  Discussed options with patient, will refer to neurology for further evaluation and recommendations.  We will also make a slight adjustment to her blood pressure medications with resuming hydrochlorothiazide at lower dose to see if this helps to control blood pressure slightly better and possibly reduce the frequency of her headaches

## 2022-02-26 NOTE — Assessment & Plan Note (Addendum)
Blood pressure has been running slightly higher without hydrochlorothiazide, but her dizziness has improved.  With his blood pressure running slightly higher, she feels that her headaches have been more noticeable and wonders about resuming hydrochlorothiazide.  Discussed potential risks and benefits with resuming hydrochlorothiazide.  We will look to reintroduce it at a lower dose.  Will prescribe 12.5 mg tablet and have patient take half a tablet daily to see if this can better control her blood pressure and hopefully improve her headaches without worsening of her dizziness She is also planning to establish with a cardiologist locally. She did have a subtle systolic murmur on exam today.  Discussed possibility of arranging for echocardiogram.  Patient would prefer to have evaluation with cardiology and further discuss with them at that time which is reasonable.

## 2022-02-26 NOTE — Addendum Note (Signed)
Addended by: DE [place], Kyung Rudd J on: 02/26/2022 04:12 PM   Modules accepted: Orders

## 2022-02-26 NOTE — Progress Notes (Signed)
Subjective:    CC: Annual Physical Exam  HPI:  Dana Finley is a 66 y.o. presenting for annual physical  I reviewed the past medical history, family history, social history, surgical history, and allergies today and no changes were needed.  Please see the problem list section below in epic for further details.  Past Medical History: Past Medical History:  Diagnosis Date   ADD (attention deficit disorder)    Anxiety    Depression    Diverticula of colon    Hemorrhoids    History of colonic polyps hyperplastic   Hypertension    Hypothyroidism    IBS (irritable bowel syndrome)    Insomnia, persistent    Migraine headache without aura    lumbosacral disc desease   Osteopenia    Serrated adenoma of colon 06/2012   Weight loss    Past Surgical History: Past Surgical History:  Procedure Laterality Date   ABDOMINAL HYSTERECTOMY     1996   APPENDECTOMY     CATARACT EXTRACTION     bilateral   COLONOSCOPY     EYE SURGERY     muscle surgery; has had approx 18 surgeries on both eyes   POLYPECTOMY     Social History: Social History   Socioeconomic History   Marital status: Married    Spouse name: Not on file   Number of children: 2   Years of education: Not on file   Highest education level: Not on file  Occupational History   Occupation: home    Employer: UNEMPLOYED  Tobacco Use   Smoking status: Former    Types: Cigarettes    Quit date: 06/12/1977    Years since quitting: 44.7   Smokeless tobacco: Never  Substance and Sexual Activity   Alcohol use: Yes    Comment: occasional   Drug use: No   Sexual activity: Not on file  Other Topics Concern   Not on file  Social History Narrative   Not on file   Social Determinants of Health   Financial Resource Strain: Not on file  Food Insecurity: Not on file  Transportation Needs: Not on file  Physical Activity: Not on file  Stress: Not on file  Social Connections: Not on file   Family History: Family History   Problem Relation Age of Onset   Diabetes Mother    Heart disease Mother    Stroke Mother    Early death Sister    Colon cancer Sister 79   Pancreatic cancer Father 38   Stomach cancer Maternal Uncle 34   Leukemia Brother    Colon cancer Cousin 72   Colon cancer Maternal Uncle 60   Colon polyps Son    Rectal cancer Neg Hx    Allergies: Allergies  Allergen Reactions   Codeine Nausea And Vomiting   Lisinopril     Becomes very sleepy   Morphine And Related     Gi upset   Medications: See med rec.  Review of Systems: No headache, visual changes, nausea, vomiting, diarrhea, constipation, dizziness, abdominal pain, skin rash, fevers, chills, night sweats, swollen lymph nodes, weight loss, chest pain, body aches, joint swelling, muscle aches, shortness of breath, mood changes, visual or auditory hallucinations.  Objective:    BP (!) 158/71   Pulse 63   Temp 97.6 F (36.4 C) (Oral)   Ht '5\' 7"'$  (1.702 m)   Wt 161 lb 9.6 oz (73.3 kg)   SpO2 98%   BMI 25.31 kg/m  General: Well Developed, well nourished, and in no acute distress. Neuro: Alert and oriented x3, extra-ocular muscles intact, sensation grossly intact. Cranial nerves II through XII are intact, motor, sensory, and coordinative functions are all intact. HEENT: Normocephalic, atraumatic, pupils equal round reactive to light, neck supple, no masses, no lymphadenopathy, thyroid nonpalpable. Oropharynx, nasopharynx, external ear canals are unremarkable. Skin: Warm and dry, no rashes noted. Cardiac: Regular rate and rhythm.  Soft systolic murmur appreciated Respiratory: Clear to auscultation bilaterally. Not using accessory muscles, speaking in full sentences. Abdominal: Soft, nontender, nondistended, positive bowel sounds, no masses, no organomegaly. Musculoskeletal: Shoulder, elbow, wrist, hip, knee, ankle stable, and with full range of motion.  Impression and Recommendations:    Wellness examination Routine HCM labs  reviewed HCM reviewed/discussed. Anticipatory guidance regarding healthy weight, lifestyle and choices given. Recommend healthy diet.  Recommend approximately 150 minutes/week of moderate intensity exercise Recommend regular dental and vision exams Always use seatbelt/lap and shoulder restraints Recommend using smoke alarms and checking batteries at least twice a year Recommend using sunscreen when outside Discussed colon cancer screening recommendations, options.  Patient reports being UTD Discussed tetanus immunization recommendations, patient agreed to proceed with this today She is due for pneumococcal vaccination, interested in receiving today, PCV 20 administered  Osteopenia Noted on prior bone density study, however this was about 15 years ago.  Has not had more recent bone density scan completed.  Order placed today for DEXA scan  Migraines She has been having some increased issues in regards to her headaches, these of been occurring more frequently.  She wonders if this is related to changes in her blood pressure medication.  Discussed options with patient, will refer to neurology for further evaluation and recommendations.  We will also make a slight adjustment to her blood pressure medications with resuming hydrochlorothiazide at lower dose to see if this helps to control blood pressure slightly better and possibly reduce the frequency of her headaches  ESSENTIAL HYPERTENSION, BENIGN Blood pressure has been running slightly higher without hydrochlorothiazide, but her dizziness has improved.  With his blood pressure running slightly higher, she feels that her headaches have been more noticeable and wonders about resuming hydrochlorothiazide.  Discussed potential risks and benefits with resuming hydrochlorothiazide.  We will look to reintroduce it at a lower dose.  Will prescribe 12.5 mg tablet and have patient take half a tablet daily to see if this can better control her blood pressure  and hopefully improve her headaches without worsening of her dizziness She is also planning to establish with a cardiologist locally. She did have a subtle systolic murmur on exam today.  Discussed possibility of arranging for echocardiogram.  Patient would prefer to have evaluation with cardiology and further discuss with them at that time which is reasonable.  Return in about 3 months (around 05/29/2022) for HTN.   ___________________________________________ Abbigal Radich de [place], MD, ABFM, [facility] and [place]

## 2022-02-26 NOTE — Assessment & Plan Note (Signed)
Noted on prior bone density study, however this was about 15 years ago.  Has not had more recent bone density scan completed.  Order placed today for DEXA scan

## 2022-02-26 NOTE — Addendum Note (Signed)
Addended by: Lowella Bandy on: 02/26/2022 04:01 PM   Modules accepted: Orders

## 2022-02-26 NOTE — Assessment & Plan Note (Addendum)
Routine HCM labs reviewed HCM reviewed/discussed. Anticipatory guidance regarding healthy weight, lifestyle and choices given. Recommend healthy diet.  Recommend approximately 150 minutes/week of moderate intensity exercise Recommend regular dental and vision exams Always use seatbelt/lap and shoulder restraints Recommend using smoke alarms and checking batteries at least twice a year Recommend using sunscreen when outside Discussed colon cancer screening recommendations, options.  Patient reports being UTD Discussed tetanus immunization recommendations, patient agreed to proceed with this today She is due for pneumococcal vaccination, interested in receiving today, PCV 20 administered

## 2022-02-27 ENCOUNTER — Ambulatory Visit (HOSPITAL_BASED_OUTPATIENT_CLINIC_OR_DEPARTMENT_OTHER): Payer: Medicare Other | Admitting: Physical Therapy

## 2022-02-27 ENCOUNTER — Encounter (HOSPITAL_BASED_OUTPATIENT_CLINIC_OR_DEPARTMENT_OTHER): Payer: Self-pay | Admitting: Physical Therapy

## 2022-02-27 ENCOUNTER — Other Ambulatory Visit (HOSPITAL_BASED_OUTPATIENT_CLINIC_OR_DEPARTMENT_OTHER): Payer: Self-pay

## 2022-02-27 DIAGNOSIS — R29898 Other symptoms and signs involving the musculoskeletal system: Secondary | ICD-10-CM | POA: Diagnosis not present

## 2022-02-27 DIAGNOSIS — M6281 Muscle weakness (generalized): Secondary | ICD-10-CM

## 2022-02-27 DIAGNOSIS — R278 Other lack of coordination: Secondary | ICD-10-CM

## 2022-02-27 NOTE — Therapy (Addendum)
OUTPATIENT PHYSICAL THERAPY TREATMENT NOTE   Patient Name: Dana Finley MRN: [id number] DOB:[birth year], 66 y.o., female Today's Date: 02/27/2022    END OF SESSION:   PT End of Session - 02/27/22 1356     Visit Number 5    Number of Visits 12    Date for PT Re-Evaluation 03/27/22    Authorization Type MCR A and B    PT Start Time 5462    PT Stop Time 1347    PT Time Calculation (min) 42 min    Activity Tolerance Patient tolerated treatment well    Behavior During Therapy WFL for tasks assessed/performed               Past Medical History:  Diagnosis Date   ADD (attention deficit disorder)    Anxiety    Depression    Diverticula of colon    Hemorrhoids    History of colonic polyps hyperplastic   Hypertension    Hypothyroidism    IBS (irritable bowel syndrome)    Insomnia, persistent    Migraine headache without aura    lumbosacral disc desease   Osteopenia    Serrated adenoma of colon 06/2012   Weight loss    Past Surgical History:  Procedure Laterality Date   ABDOMINAL HYSTERECTOMY     1996   APPENDECTOMY     CATARACT EXTRACTION     bilateral   COLONOSCOPY     EYE SURGERY     muscle surgery; has had approx 18 surgeries on both eyes   POLYPECTOMY     Patient Active Problem List   Diagnosis Date Noted   Wellness examination 02/26/2022   Osteopenia 02/26/2022   Leg weakness, bilateral 01/09/2022   Fall 12/18/2021   Skin lesion 11/26/2021   Pain of left hip joint 09/05/2021   Chronic kidney disease 08/27/2021   Hypothyroidism 08/27/2021   Bilateral high frequency sensorineural hearing loss 07/05/2019   Hallux rigidus of right foot 03/24/2018   Acute upper respiratory infection 05/31/2016   Multinodular goiter 02/27/2016   Family history of colon cancer 08/31/2014   Chalazion of right lower eyelid 10/08/2012   Meibomian gland disease 10/08/2012   Hashimoto's thyroiditis 07/10/2011   RHINITIS 12/27/2009   NUMBNESS 12/27/2009   PERIPHERAL  NEUROPATHY, LOWER EXTREMITY, 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   ACHILLES TENDINITIS 12/19/2009   HERNIATED LUMBOSACRAL DISC 12/07/2009   [place] DISEASE, LUMBOSACRAL SPINE 12/07/2009   ESSENTIAL HYPERTENSION, BENIGN 10/08/2007   IRRITABLE BOWEL SYNDROME 09/09/2007   DIARRHEA 09/09/2007   WEIGHT LOSS 08/20/2007   ANXIETY DEPRESSION 08/12/2007   INSOMNIA, CHRONIC 06/30/2007   OSTEOPENIA 06/30/2007   ADD (attention deficit disorder) 12/19/2006   Migraines 12/19/2006   COLONIC POLYPS, HYPERPLASTIC 11/19/2005   Colon polyps 11/19/2005    PCP: de [place], Raymond J, MD    REFERRING PROVIDER: [place], Raymond J, MD     REFERRING DIAG: (ICD-10-CM) - Leg weakness, bilateral   THERAPY DIAG:  Muscle weakness (generalized)   Other lack of coordination   Rationale for Evaluation and Treatment Rehabilitation   ONSET DATE: MD visit/order 01/09/2022 ; onset 1.5 years ago   SUBJECTIVE:    SUBJECTIVE STATEMENT: -Pt saw MD on 01/09/2022.  MD ordered PT and note indicated to improve strength, balance, coordination, reduce risk of fall  Pt is a member at [employer] and participates in aquatic classes 2-4x/wk.  Pt also uses the TM.     -Pt has difficulty with performing sit to stand transfers especially with toilet transfers.  Pt has much difficulty with performing transfers.  Pt states she has balance issues.  Pt has to sit for some household chores such as folding laundry due to her back pain but also may be due to her weakness/balance.  Pt states she ascends stairs very slowly and has to pull on banister.   -Pt states she felt fine after prior Rx though didn't feel great the next day.  Pt states her legs have not felt well this week.  She c/o's of a heavy feeling in bilat LE's, "They (legs) just don't feel right".  Pt states she did some of her HEP this week.  Pt denies pain currently.  Pt states she had a dizzy spell yesterday.  Pt states she sees MD next week.   Pt states she feels good today and is having a good  week.  Pt saw MD yesterday and spoke with MD about BP.  Pt states she has been having more migraines lately which she thinks is due to her BP.  MD is having pt return to taking her diuretic pill though only half.  He is also referring to neurologist.  Pt is waiting to be scheduled by neurologist.  Pt states her kidney MD is referring her to cardiology due to her passing out.  Pt reports compliance with HEP.  Pt states her legs don't feel as heavy today.         PERTINENT HISTORY: Pt states she has arthritis in back and a Hx of bilat hip bursitis. Dizziness, Stage 3 kidney disease, Migraines, and osteopenia R great toe surgery approx 4-6 years ago.      PAIN:  Are you having pain? No   PRECAUTIONS: Other: Hx of back pain and hip bursitis   WEIGHT BEARING RESTRICTIONS No   FALLS:  Has patient fallen in last 6 months? Yes. Number of falls 3   LIVING ENVIRONMENT: Lives with: lives with their spouse Lives in: 2 story home Stairs: yes Has following equipment at home: Walker - 2 wheeled     PLOF: Independent   PATIENT GOALS improve strength in her legs and improve balance     OBJECTIVE:    DIAGNOSTIC FINDINGS: N/A     TODAY'S TREATMENT: Therapeutic Exercise: Reviewed current function, pain level, and response to prior Rx. Reviewed HEP Pt performed:  Nu-step bilat UE/LE's at L3 x 5 mins  Supine SLR x10 reps bilat Supine bridge 2x10   S/L clams 2 x 10 reps each  Heel raises on airex 2x10 reps Standing hip abd 2 x 10 reps L LE and 1x10,1x6 R LE Sit to stands from table without UE's 2x10 reps  Shuttle press 3 25's 3x10 reps  Neuro Re-ed Activities: SLS with light UE support 3x20 sec bilat Marching on airex x20 without Ue's with cuing to decrease speed Step ups on airex approx 10-12 reps bilat Ambulating over 3 hurdles with step to gait pattern with SBA and ambulated with reciprocal gait with SBA with step to gait and reciprocal gait over 4 hurldes with rail at side      PATIENT EDUCATION:  Education details:  POC, dx, rationale of exercises, and exercise form. Person educated: Patient and Spouse Education method: Explanation Education comprehension: verbalized understanding and needs further education     HOME EXERCISE PROGRAM: Access Code: [email or web address] URL: [email or web address] Date: 02/15/2022 Prepared by: Ronny Flurry  Exercises - Supine Active Straight Leg Raise  - 1 x daily - 5-6 x weekly - 2 sets - 10  reps - Supine Bridge  - 1 x daily - 5-6 x weekly - 2 sets - 10 reps - Hooklying Clamshell with Resistance  - 1 x daily - 4 x weekly - 2 sets - 10 reps - Standing Heel Raise with Support  - 1 x daily - 5-6 x weekly - 2 sets - 10 reps - Romberg Stance  - 1 x daily - 7 x weekly - 3 reps - 30 seconds hold -Staggered stance 1x daily -6-7x weekly - 2 reps - 20 second hold  ASSESSMENT:   CLINICAL IMPRESSION: Pt presents to Rx stating she is having a good week and her legs are not as heavy.  Pt saw MD yesterday who placed her back on fluid pill and decreased it by half.  Pt is also going to see neurologist and cardiologist.  Pt is improving with form with exercises and did require instruction in correct form with exercises.  Pt requires cuing to keep knee straight with SLR and to have feet closer to the table when performing sit to stands.  Pt requires cuing to improve control and decrease speed with airex marches.  PT decreased L hip abd in standing due to discomfort in L knee and L medial thigh.  PT reviewed HEP and pt is compliant with HEP.  Pt had improved stability and control with stepping over hurdles and was able to perform reciprocal gait over hurdles.  Pt did not use rail with walking over hurdles.  She responded well to Rx having no c/o's after Rx.  Pt should benefit from continued skilled PT services to address impairments and to improve overall function.        OBJECTIVE IMPAIRMENTS Abnormal gait, decreased activity tolerance,  decreased balance, decreased coordination, decreased endurance, decreased mobility, difficulty walking, and decreased strength.    ACTIVITY LIMITATIONS squatting, stairs, and transfers   PARTICIPATION LIMITATIONS: community activity and walking the dog   PERSONAL FACTORS 3+ comorbidities: arthritis in back, hip bursitis, R great toe surgery  are also affecting patient's functional outcome.    REHAB POTENTIAL: Good   CLINICAL DECISION MAKING: Evolving/moderate complexity   EVALUATION COMPLEXITY: Moderate     GOALS:     SHORT TERM GOALS: Target date:  03/06/2022 Pt will be independent and compliant with HEP for improved strength, balance, and functional mobility. Baseline: Goal status: INITIAL   2.  Pt will perform 5x STS test in no > 17 sec for improved functional LE strength and performance of sit/stand transfers. Baseline:  Goal status: INITIAL   3.  Pt will report at least a 25% improvement in her balance and stability overall.  Baseline:  Goal status: INITIAL       LONG TERM GOALS: Target date: 03/27/2022   Pt will be able to perform sit/stand transfers including toilet transfers without difficulty.  Baseline:  Goal status: INITIAL   2.  Pt will demo improved bilat hip strength to at least 4+/5 and bilat knee strength to 5/5 MMT for improved performance of functional mobility.   Baseline:  Goal status: INITIAL   3.  Pt will perform 5x STS test in no > 12 seconds for improved functional LE strength.  Baseline:  Goal status: INITIAL   4.  Pt will be able to perform tandem stance for 20 sec without UE assist independently for improve balance and proprioception. Baseline:  Goal status: INITIAL   5.  Pt will demo improved quality of gait including increased gait speed.  Baseline:  Goal  status: INITIAL   6.  Pt will report at least a 60-70% improvement in her balance and stability with daily mobility.  Baseline:  Goal status: INITIAL     PLAN: PT FREQUENCY:  2x/week   PT DURATION: 6 weeks   PLANNED INTERVENTIONS: Therapeutic exercises, Therapeutic activity, Neuromuscular re-education, Balance training, Gait training, Patient/Family education, Stair training, Aquatic Therapy, Electrical stimulation, Cryotherapy, Moist heat, Manual therapy, and Re-evaluation   PLAN FOR NEXT SESSION: continue with LE strengthening and balance training.  Pt may also benefit from aquatic therapy.  Pt is waiting to be scheduled to see neurologist and will also see cardiologist.     Selinda Michaels III PT, DPT 02/27/22 2:19 PM

## 2022-02-28 ENCOUNTER — Other Ambulatory Visit (HOSPITAL_BASED_OUTPATIENT_CLINIC_OR_DEPARTMENT_OTHER): Payer: Self-pay

## 2022-02-28 ENCOUNTER — Other Ambulatory Visit: Payer: Self-pay | Admitting: Nephrology

## 2022-02-28 DIAGNOSIS — R55 Syncope and collapse: Secondary | ICD-10-CM

## 2022-03-02 ENCOUNTER — Other Ambulatory Visit (HOSPITAL_BASED_OUTPATIENT_CLINIC_OR_DEPARTMENT_OTHER): Payer: Self-pay | Admitting: Family Medicine

## 2022-03-04 ENCOUNTER — Other Ambulatory Visit (HOSPITAL_BASED_OUTPATIENT_CLINIC_OR_DEPARTMENT_OTHER): Payer: Self-pay

## 2022-03-05 ENCOUNTER — Ambulatory Visit (HOSPITAL_BASED_OUTPATIENT_CLINIC_OR_DEPARTMENT_OTHER): Payer: Medicare Other | Admitting: Physical Therapy

## 2022-03-05 ENCOUNTER — Encounter (HOSPITAL_BASED_OUTPATIENT_CLINIC_OR_DEPARTMENT_OTHER): Payer: Self-pay | Admitting: Physical Therapy

## 2022-03-05 DIAGNOSIS — R278 Other lack of coordination: Secondary | ICD-10-CM

## 2022-03-05 DIAGNOSIS — M6281 Muscle weakness (generalized): Secondary | ICD-10-CM

## 2022-03-05 DIAGNOSIS — R29898 Other symptoms and signs involving the musculoskeletal system: Secondary | ICD-10-CM | POA: Diagnosis not present

## 2022-03-05 NOTE — Therapy (Signed)
OUTPATIENT PHYSICAL THERAPY TREATMENT NOTE   Patient Name: Dana Finley MRN: [id number] DOB:Jan 31, 1956, 66 y.o., female Today's Date: 03/06/2022    END OF SESSION:   PT End of Session - 03/05/22 1359     Visit Number 6    Number of Visits 12    Date for PT Re-Evaluation 03/27/22    Authorization Type MCR A and B    PT Start Time 4193    PT Stop Time 1432    PT Time Calculation (min) 39 min    Activity Tolerance Patient tolerated treatment well    Behavior During Therapy WFL for tasks assessed/performed               Past Medical History:  Diagnosis Date   ADD (attention deficit disorder)    Anxiety    Depression    Diverticula of colon    Hemorrhoids    History of colonic polyps hyperplastic   Hypertension    Hypothyroidism    IBS (irritable bowel syndrome)    Insomnia, persistent    Migraine headache without aura    lumbosacral disc desease   Osteopenia    Serrated adenoma of colon 06/2012   Weight loss    Past Surgical History:  Procedure Laterality Date   ABDOMINAL HYSTERECTOMY     1996   APPENDECTOMY     CATARACT EXTRACTION     bilateral   COLONOSCOPY     EYE SURGERY     muscle surgery; has had approx 18 surgeries on both eyes   POLYPECTOMY     Patient Active Problem List   Diagnosis Date Noted   Wellness examination 02/26/2022   Osteopenia 02/26/2022   Leg weakness, bilateral 01/09/2022   Fall 12/18/2021   Skin lesion 11/26/2021   Pain of left hip joint 09/05/2021   Chronic kidney disease 08/27/2021   Hypothyroidism 08/27/2021   Bilateral high frequency sensorineural hearing loss 07/05/2019   Hallux rigidus of right foot 03/24/2018   Acute upper respiratory infection 05/31/2016   Multinodular goiter 02/27/2016   Family history of colon cancer 08/31/2014   Chalazion of right lower eyelid 10/08/2012   Meibomian gland disease 10/08/2012   Hashimoto's thyroiditis 07/10/2011   RHINITIS 12/27/2009   NUMBNESS 12/27/2009   PERIPHERAL  NEUROPATHY, LOWER EXTREMITY, 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   ACHILLES TENDINITIS 12/19/2009   HERNIATED LUMBOSACRAL DISC 12/07/2009   [place] DISEASE, LUMBOSACRAL SPINE 12/07/2009   ESSENTIAL HYPERTENSION, BENIGN 10/08/2007   IRRITABLE BOWEL SYNDROME 09/09/2007   DIARRHEA 09/09/2007   WEIGHT LOSS 08/20/2007   ANXIETY DEPRESSION 08/12/2007   INSOMNIA, CHRONIC 06/30/2007   OSTEOPENIA 06/30/2007   ADD (attention deficit disorder) 12/19/2006   Migraines 12/19/2006   COLONIC POLYPS, HYPERPLASTIC 11/19/2005   Colon polyps 11/19/2005    PCP: [place], Raymond J, MD    REFERRING PROVIDER: [place], Raymond J, MD     REFERRING DIAG: (ICD-10-CM) - Leg weakness, bilateral   THERAPY DIAG:  Muscle weakness (generalized)   Other lack of coordination   Rationale for Evaluation and Treatment Rehabilitation   ONSET DATE: MD visit/order 01/09/2022 ; onset 1.5 years ago   SUBJECTIVE:    SUBJECTIVE STATEMENT: Pt states she is feeling a lot better since the 1st day of therapy.  Pt states she is seeing the cardiologist on 8/14 and is still waiting for neurology appt to be scheduled.  Pt states she felt ok after prior Rx.  Pt states she has not been dizzy.  Pt tries to attend 5 aquatic  classes per week.  Pt reports compliance with HEP.  Pt denies pain currently. Pt has difficulty with performing sit to stand transfers especially with toilet transfers.  Pt states she ascends stairs very slowly and has to pull on banister.    PERTINENT HISTORY: Pt states she has arthritis in back and a Hx of bilat hip bursitis. Dizziness, Stage 3 kidney disease, Migraines, and osteopenia R great toe surgery approx 4-6 years ago.      PAIN:  Are you having pain? No   PRECAUTIONS: Other: Hx of back pain and hip bursitis   WEIGHT BEARING RESTRICTIONS No   FALLS:  Has patient fallen in last 6 months? Yes. Number of falls 3   LIVING ENVIRONMENT: Lives with: lives with their spouse Lives in: 2 story home Stairs: yes Has  following equipment at home: Walker - 2 wheeled     PLOF: Independent   PATIENT GOALS improve strength in her legs and improve balance     OBJECTIVE:    DIAGNOSTIC FINDINGS: N/A     TODAY'S TREATMENT: Therapeutic Exercise: Reviewed current function, pain level, and response to prior Rx. Reviewed HEP Pt performed:  Nu-step bilat UE/LE's at L3 x 5 mins  Supine SLR x10 reps bilat Supine bridge 2x10   S/L clams 2 x 10 reps each  Heel raises on airex 2x10 reps Standing hip abd 2 x 10 reps L LE and 1x10,1x6 R LE Sit to stands from table without UE's 2x10 reps and 1x10 reps with GTB around LE's Shuttle press 3 25's 3x10 reps  Neuro Re-ed Activities: SLS with light UE support 2x20 sec bilat Marching on airex x20 without Ue's with cuing to decrease speed Step ups on airex approx 10-12 reps bilat Tandem stance 2x20 sec with UE support Ambulating over 4 hurdles with reciprocal gait with SBA with rail at side Sidestepping over 4 hurdles with occasional UE support on rail with SBA     PATIENT EDUCATION:  Education details:  POC, dx, rationale of exercises, and exercise form. Person educated: Patient and Spouse Education method: Explanation Education comprehension: verbalized understanding and needs further education     HOME EXERCISE PROGRAM: Access Code: [email or web address] URL: [email or web address] Date: 02/15/2022 Prepared by: Ronny Flurry  Exercises - Supine Active Straight Leg Raise  - 1 x daily - 5-6 x weekly - 2 sets - 10 reps - Supine Bridge  - 1 x daily - 5-6 x weekly - 2 sets - 10 reps - Hooklying Clamshell with Resistance  - 1 x daily - 4 x weekly - 2 sets - 10 reps - Standing Heel Raise with Support  - 1 x daily - 5-6 x weekly - 2 sets - 10 reps - Romberg Stance  - 1 x daily - 7 x weekly - 3 reps - 30 seconds hold -Staggered stance 1x daily -6-7x weekly - 2 reps - 20 second hold  ASSESSMENT:   CLINICAL IMPRESSION: Pt presents to Rx stating she is  feeling much better.  Pt is improving with tolerance to exercises as evidenced by progression of exercises.  Pt has improved performance and stability with ambulating over hurdles.  Pt has increased knee IR R > L with sit to stands.  PT used a theraband around thighs to improve knee alignment with sit to stands.  Pt is very motivated and gives great effort with all exercises.  She responded well to Rx and was fatigued after Rx.  Pt should benefit from continued skilled PT services to  address impairments and to improve overall function.        OBJECTIVE IMPAIRMENTS Abnormal gait, decreased activity tolerance, decreased balance, decreased coordination, decreased endurance, decreased mobility, difficulty walking, and decreased strength.    ACTIVITY LIMITATIONS squatting, stairs, and transfers   PARTICIPATION LIMITATIONS: community activity and walking the dog   PERSONAL FACTORS 3+ comorbidities: arthritis in back, hip bursitis, R great toe surgery  are also affecting patient's functional outcome.    REHAB POTENTIAL: Good   CLINICAL DECISION MAKING: Evolving/moderate complexity   EVALUATION COMPLEXITY: Moderate     GOALS:     SHORT TERM GOALS: Target date:  03/06/2022 Pt will be independent and compliant with HEP for improved strength, balance, and functional mobility. Baseline: Goal status: INITIAL   2.  Pt will perform 5x STS test in no > 17 sec for improved functional LE strength and performance of sit/stand transfers. Baseline:  Goal status: INITIAL   3.  Pt will report at least a 25% improvement in her balance and stability overall.  Baseline:  Goal status: INITIAL       LONG TERM GOALS: Target date: 03/27/2022   Pt will be able to perform sit/stand transfers including toilet transfers without difficulty.  Baseline:  Goal status: INITIAL   2.  Pt will demo improved bilat hip strength to at least 4+/5 and bilat knee strength to 5/5 MMT for improved performance of functional  mobility.   Baseline:  Goal status: INITIAL   3.  Pt will perform 5x STS test in no > 12 seconds for improved functional LE strength.  Baseline:  Goal status: INITIAL   4.  Pt will be able to perform tandem stance for 20 sec without UE assist independently for improve balance and proprioception. Baseline:  Goal status: INITIAL   5.  Pt will demo improved quality of gait including increased gait speed.  Baseline:  Goal status: INITIAL   6.  Pt will report at least a 60-70% improvement in her balance and stability with daily mobility.  Baseline:  Goal status: INITIAL     PLAN: PT FREQUENCY: 2x/week   PT DURATION: 6 weeks   PLANNED INTERVENTIONS: Therapeutic exercises, Therapeutic activity, Neuromuscular re-education, Balance training, Gait training, Patient/Family education, Stair training, Aquatic Therapy, Electrical stimulation, Cryotherapy, Moist heat, Manual therapy, and Re-evaluation   PLAN FOR NEXT SESSION: continue with LE strengthening and balance training.  Pt may also benefit from aquatic therapy.  Pt is waiting to be scheduled to see neurologist.  She is seeing cardiologist on 8/14.     Selinda Michaels III PT, DPT 03/06/22 10:05 PM

## 2022-03-06 ENCOUNTER — Ambulatory Visit (HOSPITAL_BASED_OUTPATIENT_CLINIC_OR_DEPARTMENT_OTHER): Payer: Medicare Other | Admitting: Physical Therapy

## 2022-03-07 ENCOUNTER — Ambulatory Visit (HOSPITAL_BASED_OUTPATIENT_CLINIC_OR_DEPARTMENT_OTHER): Payer: Medicare Other | Admitting: Physical Therapy

## 2022-03-07 ENCOUNTER — Encounter (HOSPITAL_BASED_OUTPATIENT_CLINIC_OR_DEPARTMENT_OTHER): Payer: Self-pay | Admitting: Physical Therapy

## 2022-03-07 DIAGNOSIS — M6281 Muscle weakness (generalized): Secondary | ICD-10-CM

## 2022-03-07 DIAGNOSIS — R29898 Other symptoms and signs involving the musculoskeletal system: Secondary | ICD-10-CM | POA: Diagnosis not present

## 2022-03-07 DIAGNOSIS — R278 Other lack of coordination: Secondary | ICD-10-CM

## 2022-03-07 NOTE — Therapy (Signed)
OUTPATIENT PHYSICAL THERAPY TREATMENT NOTE   Patient Name: Dana Finley MRN: [id number] DOB:1956/06/14, 66 y.o., female Today's Date: 03/08/2022    END OF SESSION:   PT End of Session - 03/07/22 1433     Visit Number 7    Number of Visits 12    Date for PT Re-Evaluation 03/27/22    Authorization Type MCR A and B    PT Start Time 1350    PT Stop Time 1430    PT Time Calculation (min) 40 min    Activity Tolerance Patient tolerated treatment well    Behavior During Therapy WFL for tasks assessed/performed                Past Medical History:  Diagnosis Date   ADD (attention deficit disorder)    Anxiety    Depression    Diverticula of colon    Hemorrhoids    History of colonic polyps hyperplastic   Hypertension    Hypothyroidism    IBS (irritable bowel syndrome)    Insomnia, persistent    Migraine headache without aura    lumbosacral disc desease   Osteopenia    Serrated adenoma of colon 06/2012   Weight loss    Past Surgical History:  Procedure Laterality Date   ABDOMINAL HYSTERECTOMY     1996   APPENDECTOMY     CATARACT EXTRACTION     bilateral   COLONOSCOPY     EYE SURGERY     muscle surgery; has had approx 18 surgeries on both eyes   POLYPECTOMY     Patient Active Problem List   Diagnosis Date Noted   Wellness examination 02/26/2022   Osteopenia 02/26/2022   Leg weakness, bilateral 01/09/2022   Fall 12/18/2021   Skin lesion 11/26/2021   Pain of left hip joint 09/05/2021   Chronic kidney disease 08/27/2021   Hypothyroidism 08/27/2021   Bilateral high frequency sensorineural hearing loss 07/05/2019   Hallux rigidus of right foot 03/24/2018   Acute upper respiratory infection 05/31/2016   Multinodular goiter 02/27/2016   Family history of colon cancer 08/31/2014   Chalazion of right lower eyelid 10/08/2012   Meibomian gland disease 10/08/2012   Hashimoto's thyroiditis 07/10/2011   RHINITIS 12/27/2009   NUMBNESS 12/27/2009   PERIPHERAL  NEUROPATHY, LOWER EXTREMITY, 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   ACHILLES TENDINITIS 12/19/2009   HERNIATED LUMBOSACRAL DISC 12/07/2009   [place] DISEASE, LUMBOSACRAL SPINE 12/07/2009   ESSENTIAL HYPERTENSION, BENIGN 10/08/2007   IRRITABLE BOWEL SYNDROME 09/09/2007   DIARRHEA 09/09/2007   WEIGHT LOSS 08/20/2007   ANXIETY DEPRESSION 08/12/2007   INSOMNIA, CHRONIC 06/30/2007   OSTEOPENIA 06/30/2007   ADD (attention deficit disorder) 12/19/2006   Migraines 12/19/2006   COLONIC POLYPS, HYPERPLASTIC 11/19/2005   Colon polyps 11/19/2005    PCP: de [place], Raymond J, MD    REFERRING PROVIDER: [place], Raymond J, MD     REFERRING DIAG: (ICD-10-CM) - Leg weakness, bilateral   THERAPY DIAG:  Muscle weakness (generalized)   Other lack of coordination   Rationale for Evaluation and Treatment Rehabilitation   ONSET DATE: MD visit/order 01/09/2022 ; onset 1.5 years ago   SUBJECTIVE:    SUBJECTIVE STATEMENT: Pt states she was feeling good last Rx.  She went grocery shopping after prior Rx and reports significant weakness in her legs.  She got in bed after unloading groceries and was in bed all day Wednesday and this AM.  Pt reports having pain in bilat thighs and knees with walking yesterday.  She doesn't  have pain today though legs feel heavy.  Pt feels tired.  Pt had one occasion of dizziness when getting OOB though didn't last long. Pt tries to attend 5 aquatic classes per week.  Pt reports compliance with HEP.  Pt denies pain currently.  Pt cooked last night though her R knee wanted to give way with standing.    PERTINENT HISTORY: Pt states she has arthritis in back and a Hx of bilat hip bursitis. Dizziness, Stage 3 kidney disease, Migraines, and osteopenia R great toe surgery approx 4-6 years ago.      PAIN:  Are you having pain? No   PRECAUTIONS: Other: Hx of back pain and hip bursitis   WEIGHT BEARING RESTRICTIONS No   FALLS:  Has patient fallen in last 6 months? Yes. Number of falls 3    LIVING ENVIRONMENT: Lives with: lives with their spouse Lives in: 2 story home Stairs: yes Has following equipment at home: Walker - 2 wheeled     PLOF: Independent   PATIENT GOALS improve strength in her legs and improve balance     OBJECTIVE:    DIAGNOSTIC FINDINGS: N/A     TODAY'S TREATMENT: Therapeutic Exercise: Reviewed current function, pain level, and response to prior Rx. Pt performed:  Nu-step bilat UE/LE's at L2 x 4 mins  Supine bridge 2x10   S/L clams x 10 reps each  Heel raises on airex 2x10 reps Sit to stands from table without UE's 2x10 reps with GTB around LE's Shuttle press 2 25's 2x10 reps  Neuro Re-ed Activities: SLS with light UE support 2x20 sec bilat Marching on airex 2x10 with occasional support for LOB and min assist from PT for LOB Step ups on airex approx 2x5 each LE Ambulating over 4 hurdles with step to gait and reciprocal gait with SBA with rail at side    PATIENT EDUCATION:  Education details:  POC, dx, rationale of exercises, and exercise form. Person educated: Patient and Spouse Education method: Explanation Education comprehension: verbalized understanding and needs further education     HOME EXERCISE PROGRAM: Access Code: [email or web address] URL: [email or web address] Date: 02/15/2022 Prepared by: Ronny Flurry  Exercises - Supine Active Straight Leg Raise  - 1 x daily - 5-6 x weekly - 2 sets - 10 reps - Supine Bridge  - 1 x daily - 5-6 x weekly - 2 sets - 10 reps - Hooklying Clamshell with Resistance  - 1 x daily - 4 x weekly - 2 sets - 10 reps - Standing Heel Raise with Support  - 1 x daily - 5-6 x weekly - 2 sets - 10 reps - Romberg Stance  - 1 x daily - 7 x weekly - 3 reps - 30 seconds hold -Staggered stance 1x daily -6-7x weekly - 2 reps - 20 second hold  ASSESSMENT:   CLINICAL IMPRESSION: Pt presents to Rx reporting increased fatigue and her legs feeling very heavy.  Pt reports going to grocery store after prior Rx  and having significant fatigue afterwards.  Pt has pretty much been in her bed since.  PT decreased resistance and volume of exercises and pt was able to complete exercises today.  She reports fatigue t/o Rx though did not feel worse after Rx.  Pt should benefit from continued skilled PT services to address impairments and to improve overall function.        OBJECTIVE IMPAIRMENTS Abnormal gait, decreased activity tolerance, decreased balance, decreased coordination, decreased endurance, decreased mobility, difficulty walking, and decreased strength.  ACTIVITY LIMITATIONS squatting, stairs, and transfers   PARTICIPATION LIMITATIONS: community activity and walking the dog   PERSONAL FACTORS 3+ comorbidities: arthritis in back, hip bursitis, R great toe surgery  are also affecting patient's functional outcome.    REHAB POTENTIAL: Good   CLINICAL DECISION MAKING: Evolving/moderate complexity   EVALUATION COMPLEXITY: Moderate     GOALS:     SHORT TERM GOALS: Target date:  03/06/2022 Pt will be independent and compliant with HEP for improved strength, balance, and functional mobility. Baseline: Goal status: INITIAL   2.  Pt will perform 5x STS test in no > 17 sec for improved functional LE strength and performance of sit/stand transfers. Baseline:  Goal status: INITIAL   3.  Pt will report at least a 25% improvement in her balance and stability overall.  Baseline:  Goal status: INITIAL       LONG TERM GOALS: Target date: 03/27/2022   Pt will be able to perform sit/stand transfers including toilet transfers without difficulty.  Baseline:  Goal status: INITIAL   2.  Pt will demo improved bilat hip strength to at least 4+/5 and bilat knee strength to 5/5 MMT for improved performance of functional mobility.   Baseline:  Goal status: INITIAL   3.  Pt will perform 5x STS test in no > 12 seconds for improved functional LE strength.  Baseline:  Goal status: INITIAL   4.  Pt will  be able to perform tandem stance for 20 sec without UE assist independently for improve balance and proprioception. Baseline:  Goal status: INITIAL   5.  Pt will demo improved quality of gait including increased gait speed.  Baseline:  Goal status: INITIAL   6.  Pt will report at least a 60-70% improvement in her balance and stability with daily mobility.  Baseline:  Goal status: INITIAL     PLAN: PT FREQUENCY: 2x/week   PT DURATION: 6 weeks   PLANNED INTERVENTIONS: Therapeutic exercises, Therapeutic activity, Neuromuscular re-education, Balance training, Gait training, Patient/Family education, Stair training, Aquatic Therapy, Electrical stimulation, Cryotherapy, Moist heat, Manual therapy, and Re-evaluation   PLAN FOR NEXT SESSION: continue with LE strengthening and balance training.  Pt may also benefit from aquatic therapy.  Pt is waiting to be scheduled to see neurologist.  She is seeing cardiologist on 8/14.     Selinda Michaels III PT, DPT 03/08/22 11:41 AM

## 2022-03-11 ENCOUNTER — Ambulatory Visit (HOSPITAL_BASED_OUTPATIENT_CLINIC_OR_DEPARTMENT_OTHER): Payer: Medicare Other | Admitting: Family Medicine

## 2022-03-11 ENCOUNTER — Encounter (HOSPITAL_BASED_OUTPATIENT_CLINIC_OR_DEPARTMENT_OTHER): Payer: Self-pay | Admitting: Physical Therapy

## 2022-03-11 ENCOUNTER — Ambulatory Visit (HOSPITAL_BASED_OUTPATIENT_CLINIC_OR_DEPARTMENT_OTHER): Payer: Medicare Other | Admitting: Physical Therapy

## 2022-03-11 DIAGNOSIS — R278 Other lack of coordination: Secondary | ICD-10-CM

## 2022-03-11 DIAGNOSIS — R29898 Other symptoms and signs involving the musculoskeletal system: Secondary | ICD-10-CM | POA: Diagnosis not present

## 2022-03-11 DIAGNOSIS — M6281 Muscle weakness (generalized): Secondary | ICD-10-CM

## 2022-03-11 NOTE — Therapy (Signed)
OUTPATIENT PHYSICAL THERAPY TREATMENT NOTE   Patient Name: Dana Finley MRN: [id number] DOB:March 17, 1956, 66 y.o., female Today's Date: 03/11/2022    END OF SESSION:   PT End of Session - 03/11/22 1153     Visit Number 8    Number of Visits 12    Date for PT Re-Evaluation 03/27/22    Authorization Type MCR A and B    PT Start Time 1150    PT Stop Time 1230    PT Time Calculation (min) 40 min    Activity Tolerance Patient tolerated treatment well    Behavior During Therapy WFL for tasks assessed/performed                 Past Medical History:  Diagnosis Date   ADD (attention deficit disorder)    Anxiety    Depression    Diverticula of colon    Hemorrhoids    History of colonic polyps hyperplastic   Hypertension    Hypothyroidism    IBS (irritable bowel syndrome)    Insomnia, persistent    Migraine headache without aura    lumbosacral disc desease   Osteopenia    Serrated adenoma of colon 06/2012   Weight loss    Past Surgical History:  Procedure Laterality Date   ABDOMINAL HYSTERECTOMY     1996   APPENDECTOMY     CATARACT EXTRACTION     bilateral   COLONOSCOPY     EYE SURGERY     muscle surgery; has had approx 18 surgeries on both eyes   POLYPECTOMY     Patient Active Problem List   Diagnosis Date Noted   Wellness examination 02/26/2022   Osteopenia 02/26/2022   Leg weakness, bilateral 01/09/2022   Fall 12/18/2021   Skin lesion 11/26/2021   Pain of left hip joint 09/05/2021   Chronic kidney disease 08/27/2021   Hypothyroidism 08/27/2021   Bilateral high frequency sensorineural hearing loss 07/05/2019   Hallux rigidus of right foot 03/24/2018   Acute upper respiratory infection 05/31/2016   Multinodular goiter 02/27/2016   Family history of colon cancer 08/31/2014   Chalazion of right lower eyelid 10/08/2012   Meibomian gland disease 10/08/2012   Hashimoto's thyroiditis 07/10/2011   RHINITIS 12/27/2009   NUMBNESS 12/27/2009   PERIPHERAL  NEUROPATHY, LOWER EXTREMITY, 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   ACHILLES TENDINITIS 12/19/2009   HERNIATED LUMBOSACRAL DISC 12/07/2009   [place] DISEASE, LUMBOSACRAL SPINE 12/07/2009   ESSENTIAL HYPERTENSION, BENIGN 10/08/2007   IRRITABLE BOWEL SYNDROME 09/09/2007   DIARRHEA 09/09/2007   WEIGHT LOSS 08/20/2007   ANXIETY DEPRESSION 08/12/2007   INSOMNIA, CHRONIC 06/30/2007   OSTEOPENIA 06/30/2007   ADD (attention deficit disorder) 12/19/2006   Migraines 12/19/2006   COLONIC POLYPS, HYPERPLASTIC 11/19/2005   Colon polyps 11/19/2005    PCP: de [place], Raymond J, MD    REFERRING PROVIDER: [place], Raymond J, MD     REFERRING DIAG: (ICD-10-CM) - Leg weakness, bilateral   THERAPY DIAG:  Muscle weakness (generalized)   Other lack of coordination   Rationale for Evaluation and Treatment Rehabilitation   ONSET DATE: MD visit/order 01/09/2022 ; onset 1.5 years ago   SUBJECTIVE:    SUBJECTIVE STATEMENT: Yesterday and today are better.     PERTINENT HISTORY: Pt states she has arthritis in back and a Hx of bilat hip bursitis. Dizziness, Stage 3 kidney disease, Migraines, and osteopenia R great toe surgery approx 4-6 years ago.      PAIN:  Are you having pain? No   PRECAUTIONS:  Other: Hx of back pain and hip bursitis   WEIGHT BEARING RESTRICTIONS No   FALLS:  Has patient fallen in last 6 months? Yes. Number of falls 3   LIVING ENVIRONMENT: Lives with: lives with their spouse Lives in: 2 story home Stairs: yes Has following equipment at home: Walker - 2 wheeled     PLOF: Independent   PATIENT GOALS improve strength in her legs and improve balance     OBJECTIVE:    DIAGNOSTIC FINDINGS: N/A     TODAY'S TREATMENT: Wall sits with 5s holds Heel raises with fingers on the wall behind here SLR- neutral & turnout with cues for abdominal contraction Sidelying hip abduction      PATIENT EDUCATION:  Education details:  POC, dx, rationale of exercises, and exercise form. Person  educated: Patient and Spouse Education method: Explanation Education comprehension: verbalized understanding and needs further education     HOME EXERCISE PROGRAM: Access Code: [email or web address] URL: [email or web address]   ASSESSMENT:   CLINICAL IMPRESSION: Encouraged trial of light compression socks for LE heaviness. Added a few more options for table exercises as she was feeling very heavy today.      OBJECTIVE IMPAIRMENTS Abnormal gait, decreased activity tolerance, decreased balance, decreased coordination, decreased endurance, decreased mobility, difficulty walking, and decreased strength.    ACTIVITY LIMITATIONS squatting, stairs, and transfers   PARTICIPATION LIMITATIONS: community activity and walking the dog   PERSONAL FACTORS 3+ comorbidities: arthritis in back, hip bursitis, R great toe surgery  are also affecting patient's functional outcome.    REHAB POTENTIAL: Good   CLINICAL DECISION MAKING: Evolving/moderate complexity   EVALUATION COMPLEXITY: Moderate     GOALS:     SHORT TERM GOALS: Target date:  03/06/2022 Pt will be independent and compliant with HEP for improved strength, balance, and functional mobility. Baseline: Goal status: achieved   2.  Pt will perform 5x STS test in no > 17 sec for improved functional LE strength and performance of sit/stand transfers. Baseline: 22s Goal status: ongoing   3.  Pt will report at least a 25% improvement in her balance and stability overall.  Baseline: maybe 10% improvement Goal status: ongoing       LONG TERM GOALS: Target date: 03/27/2022   Pt will be able to perform sit/stand transfers including toilet transfers without difficulty.  Baseline:  Goal status: INITIAL   2.  Pt will demo improved bilat hip strength to at least 4+/5 and bilat knee strength to 5/5 MMT for improved performance of functional mobility.   Baseline:  Goal status: INITIAL   3.  Pt will perform 5x STS test in no > 12 seconds  for improved functional LE strength.  Baseline:  Goal status: INITIAL   4.  Pt will be able to perform tandem stance for 20 sec without UE assist independently for improve balance and proprioception. Baseline:  Goal status: INITIAL   5.  Pt will demo improved quality of gait including increased gait speed.  Baseline:  Goal status: INITIAL   6.  Pt will report at least a 60-70% improvement in her balance and stability with daily mobility.  Baseline:  Goal status: INITIAL     PLAN: PT FREQUENCY: 2x/week   PT DURATION: 6 weeks   PLANNED INTERVENTIONS: Therapeutic exercises, Therapeutic activity, Neuromuscular re-education, Balance training, Gait training, Patient/Family education, Stair training, Aquatic Therapy, Electrical stimulation, Cryotherapy, Moist heat, Manual therapy, and Re-evaluation   PLAN FOR NEXT SESSION: continue with LE strengthening and balance training.  Pt  may also benefit from aquatic therapy.  Pt is waiting to be scheduled to see neurologist.  She is seeing cardiologist on 8/14.    Lydie Stammen C. Aryan Bello PT, DPT 03/11/22 12:53 PM

## 2022-03-13 ENCOUNTER — Ambulatory Visit
Admission: RE | Admit: 2022-03-13 | Discharge: 2022-03-13 | Disposition: A | Discharge status: Home / Self Care | Payer: Medicare Other | Source: Ambulatory Visit | Attending: Nephrology | Admitting: Nephrology

## 2022-03-13 DIAGNOSIS — R55 Syncope and collapse: Secondary | ICD-10-CM

## 2022-03-15 ENCOUNTER — Ambulatory Visit (HOSPITAL_BASED_OUTPATIENT_CLINIC_OR_DEPARTMENT_OTHER): Payer: Medicare Other | Attending: Family Medicine | Admitting: Physical Therapy

## 2022-03-15 ENCOUNTER — Encounter (HOSPITAL_BASED_OUTPATIENT_CLINIC_OR_DEPARTMENT_OTHER): Payer: Self-pay | Admitting: Physical Therapy

## 2022-03-15 ENCOUNTER — Encounter (HOSPITAL_BASED_OUTPATIENT_CLINIC_OR_DEPARTMENT_OTHER): Payer: Medicare Other | Admitting: Physical Therapy

## 2022-03-15 DIAGNOSIS — R278 Other lack of coordination: Secondary | ICD-10-CM | POA: Insufficient documentation

## 2022-03-15 DIAGNOSIS — M6281 Muscle weakness (generalized): Secondary | ICD-10-CM | POA: Diagnosis present

## 2022-03-15 NOTE — Therapy (Signed)
OUTPATIENT PHYSICAL THERAPY TREATMENT NOTE   Patient Name: Dana Finley MRN: [id number] DOB:1955/09/24, 66 y.o., female Today's Date: 03/15/2022    END OF SESSION:   PT End of Session - 03/15/22 1949     Visit Number 9    Number of Visits 12    Date for PT Re-Evaluation 03/27/22    Authorization Type MCR A and B    PT Start Time 1194    PT Stop Time 1312    PT Time Calculation (min) 37 min    Activity Tolerance Treatment limited secondary to medical complications (Comment)    Behavior During Therapy WFL for tasks assessed/performed                  Past Medical History:  Diagnosis Date   ADD (attention deficit disorder)    Anxiety    Depression    Diverticula of colon    Hemorrhoids    History of colonic polyps hyperplastic   Hypertension    Hypothyroidism    IBS (irritable bowel syndrome)    Insomnia, persistent    Migraine headache without aura    lumbosacral disc desease   Osteopenia    Serrated adenoma of colon 06/2012   Weight loss    Past Surgical History:  Procedure Laterality Date   ABDOMINAL HYSTERECTOMY     1996   APPENDECTOMY     CATARACT EXTRACTION     bilateral   COLONOSCOPY     EYE SURGERY     muscle surgery; has had approx 18 surgeries on both eyes   POLYPECTOMY     Patient Active Problem List   Diagnosis Date Noted   Wellness examination 02/26/2022   Osteopenia 02/26/2022   Leg weakness, bilateral 01/09/2022   Fall 12/18/2021   Skin lesion 11/26/2021   Pain of left hip joint 09/05/2021   Chronic kidney disease 08/27/2021   Hypothyroidism 08/27/2021   Bilateral high frequency sensorineural hearing loss 07/05/2019   Hallux rigidus of right foot 03/24/2018   Acute upper respiratory infection 05/31/2016   Multinodular goiter 02/27/2016   Family history of colon cancer 08/31/2014   Chalazion of right lower eyelid 10/08/2012   Meibomian gland disease 10/08/2012   Hashimoto's thyroiditis 07/10/2011   RHINITIS 12/27/2009    NUMBNESS 12/27/2009   PERIPHERAL NEUROPATHY, LOWER EXTREMITY, 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   ACHILLES TENDINITIS 12/19/2009   HERNIATED LUMBOSACRAL DISC 12/07/2009   [place] DISEASE, LUMBOSACRAL SPINE 12/07/2009   ESSENTIAL HYPERTENSION, BENIGN 10/08/2007   IRRITABLE BOWEL SYNDROME 09/09/2007   DIARRHEA 09/09/2007   WEIGHT LOSS 08/20/2007   ANXIETY DEPRESSION 08/12/2007   INSOMNIA, CHRONIC 06/30/2007   OSTEOPENIA 06/30/2007   ADD (attention deficit disorder) 12/19/2006   Migraines 12/19/2006   COLONIC POLYPS, HYPERPLASTIC 11/19/2005   Colon polyps 11/19/2005    PCP: [place], Raymond J, MD    REFERRING PROVIDER: [place], Raymond J, MD     REFERRING DIAG: (ICD-10-CM) - Leg weakness, bilateral   THERAPY DIAG:  Muscle weakness (generalized)   Other lack of coordination   Rationale for Evaluation and Treatment Rehabilitation   ONSET DATE: MD visit/order 01/09/2022 ; onset 1.5 years ago   SUBJECTIVE:    SUBJECTIVE STATEMENT: Pt reports feeling jittery and like something bad is going to happen. She states that this is how she felt before she passed out a few times recently.   Arrival: 142/85, HR 73  O2 96% 74 BPM 10 min later: 143/89, 75 bpm 10 min later & standing: 151/93, 85  bpm    PERTINENT HISTORY: Pt states she has arthritis in back and a Hx of bilat hip bursitis. Dizziness, Stage 3 kidney disease, Migraines, and osteopenia R great toe surgery approx 4-6 years ago.      PAIN:  Are you having pain? No   PRECAUTIONS: Other: Hx of back pain and hip bursitis   WEIGHT BEARING RESTRICTIONS No   FALLS:  Has patient fallen in last 6 months? Yes. Number of falls 3   LIVING ENVIRONMENT: Lives with: lives with their spouse Lives in: 2 story home Stairs: yes Has following equipment at home: Walker - 2 wheeled     PLOF: Independent   PATIENT GOALS improve strength in her legs and improve balance     OBJECTIVE:    DIAGNOSTIC FINDINGS: N/A     TODAY'S TREATMENT: See  plan      PATIENT EDUCATION:  Education details:  POC, dx, rationale of exercises, and exercise form. Person educated: Patient and Spouse Education method: Explanation Education comprehension: verbalized understanding and needs further education     HOME EXERCISE PROGRAM: Access Code: [email or web address] URL: [email or web address]   ASSESSMENT:   CLINICAL IMPRESSION: Pt arrived to PT not feeling well today- BP measurements kept rising as measurements were taken as well as an increase noted in standing. Gradual increase was poor indication for exercise today. She opted to go home with her husband and I encouraged her to seek urgent care should symptoms worsen. Sent a message to PCP to indicate changes. She is seeing cardiologist on 8/14.     OBJECTIVE IMPAIRMENTS Abnormal gait, decreased activity tolerance, decreased balance, decreased coordination, decreased endurance, decreased mobility, difficulty walking, and decreased strength.    ACTIVITY LIMITATIONS squatting, stairs, and transfers   PARTICIPATION LIMITATIONS: community activity and walking the dog   PERSONAL FACTORS 3+ comorbidities: arthritis in back, hip bursitis, R great toe surgery  are also affecting patient's functional outcome.    REHAB POTENTIAL: Good   CLINICAL DECISION MAKING: Evolving/moderate complexity   EVALUATION COMPLEXITY: Moderate     GOALS:     SHORT TERM GOALS: Target date:  03/06/2022 Pt will be independent and compliant with HEP for improved strength, balance, and functional mobility. Baseline: Goal status: achieved   2.  Pt will perform 5x STS test in no > 17 sec for improved functional LE strength and performance of sit/stand transfers. Baseline: 22s Goal status: ongoing   3.  Pt will report at least a 25% improvement in her balance and stability overall.  Baseline: maybe 10% improvement Goal status: ongoing       LONG TERM GOALS: Target date: 03/27/2022   Pt will be able to  perform sit/stand transfers including toilet transfers without difficulty.  Baseline:  Goal status: INITIAL   2.  Pt will demo improved bilat hip strength to at least 4+/5 and bilat knee strength to 5/5 MMT for improved performance of functional mobility.   Baseline:  Goal status: INITIAL   3.  Pt will perform 5x STS test in no > 12 seconds for improved functional LE strength.  Baseline:  Goal status: INITIAL   4.  Pt will be able to perform tandem stance for 20 sec without UE assist independently for improve balance and proprioception. Baseline:  Goal status: INITIAL   5.  Pt will demo improved quality of gait including increased gait speed.  Baseline:  Goal status: INITIAL   6.  Pt will report at least a 60-70% improvement in her balance and stability  with daily mobility.  Baseline:  Goal status: INITIAL     PLAN: PT FREQUENCY: 2x/week   PT DURATION: 6 weeks   PLANNED INTERVENTIONS: Therapeutic exercises, Therapeutic activity, Neuromuscular re-education, Balance training, Gait training, Patient/Family education, Stair training, Aquatic Therapy, Electrical stimulation, Cryotherapy, Moist heat, Manual therapy, and Re-evaluation   PLAN FOR NEXT SESSION: continue with LE strengthening and balance training.  Pt may also benefit from aquatic therapy.  Pt is waiting to be scheduled to see neurologist.  She is seeing cardiologist on 8/14.    Sanvika Cuttino C. Shaquille Murdy PT, DPT 03/15/22 7:54 PM

## 2022-03-18 ENCOUNTER — Ambulatory Visit (HOSPITAL_BASED_OUTPATIENT_CLINIC_OR_DEPARTMENT_OTHER): Payer: Medicare Other | Admitting: Physical Therapy

## 2022-03-18 ENCOUNTER — Encounter (HOSPITAL_BASED_OUTPATIENT_CLINIC_OR_DEPARTMENT_OTHER): Payer: Self-pay | Admitting: Physical Therapy

## 2022-03-18 DIAGNOSIS — M6281 Muscle weakness (generalized): Secondary | ICD-10-CM | POA: Diagnosis not present

## 2022-03-18 DIAGNOSIS — R278 Other lack of coordination: Secondary | ICD-10-CM

## 2022-03-18 NOTE — Therapy (Addendum)
OUTPATIENT PHYSICAL THERAPY TREATMENT NOTE / PROGRESS NOTE  Progress Note Reporting Period 02/13/2022 to 03/18/2022  See note below for Objective Data and Assessment of Progress/Goals.       Patient Name: Dana Finley MRN: [id number] DOB:Dec 13, 1955, 66 y.o., female Today's Date: 03/18/2022    END OF SESSION:   PT End of Session - 03/18/22 1227     Visit Number 10    Number of Visits 18    Date for PT Re-Evaluation 04/15/22    Authorization Type MCR A and B    PT Start Time 3570    PT Stop Time 1237    PT Time Calculation (min) 43 min    Activity Tolerance Pt tolerated treatment well.   Behavior During Therapy WFL for tasks assessed/performed                 Past Medical History:  Diagnosis Date   ADD (attention deficit disorder)    Anxiety    Depression    Diverticula of colon    Hemorrhoids    History of colonic polyps hyperplastic   Hypertension    Hypothyroidism    IBS (irritable bowel syndrome)    Insomnia, persistent    Migraine headache without aura    lumbosacral disc desease   Osteopenia    Serrated adenoma of colon 06/2012   Weight loss    Past Surgical History:  Procedure Laterality Date   ABDOMINAL HYSTERECTOMY     1996   APPENDECTOMY     CATARACT EXTRACTION     bilateral   COLONOSCOPY     EYE SURGERY     muscle surgery; has had approx 18 surgeries on both eyes   POLYPECTOMY     Patient Active Problem List   Diagnosis Date Noted   Wellness examination 02/26/2022   Osteopenia 02/26/2022   Leg weakness, bilateral 01/09/2022   Fall 12/18/2021   Skin lesion 11/26/2021   Pain of left hip joint 09/05/2021   Chronic kidney disease 08/27/2021   Hypothyroidism 08/27/2021   Bilateral high frequency sensorineural hearing loss 07/05/2019   Hallux rigidus of right foot 03/24/2018   Acute upper respiratory infection 05/31/2016   Multinodular goiter 02/27/2016   Family history of colon cancer 08/31/2014   Chalazion of right lower eyelid  10/08/2012   Meibomian gland disease 10/08/2012   Hashimoto's thyroiditis 07/10/2011   RHINITIS 12/27/2009   NUMBNESS 12/27/2009   PERIPHERAL NEUROPATHY, LOWER EXTREMITY, 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   ACHILLES TENDINITIS 12/19/2009   HERNIATED LUMBOSACRAL DISC 12/07/2009   [place] DISEASE, LUMBOSACRAL SPINE 12/07/2009   ESSENTIAL HYPERTENSION, BENIGN 10/08/2007   IRRITABLE BOWEL SYNDROME 09/09/2007   DIARRHEA 09/09/2007   WEIGHT LOSS 08/20/2007   ANXIETY DEPRESSION 08/12/2007   INSOMNIA, CHRONIC 06/30/2007   OSTEOPENIA 06/30/2007   ADD (attention deficit disorder) 12/19/2006   Migraines 12/19/2006   COLONIC POLYPS, HYPERPLASTIC 11/19/2005   Colon polyps 11/19/2005    PCP: de [place], Raymond J, MD    REFERRING PROVIDER: [place], Raymond J, MD     REFERRING DIAG: (ICD-10-CM) - Leg weakness, bilateral   THERAPY DIAG:  Muscle weakness (generalized)   Other lack of coordination   Rationale for Evaluation and Treatment Rehabilitation   ONSET DATE: MD visit/order 01/09/2022 ; onset 1.5 years ago   SUBJECTIVE:    SUBJECTIVE STATEMENT: Pt denies any adverse effects after prior Rx.  Pt reports compliance with HEP.  Pt states she has good days and bad days.  Pt states her legs feel heavy  some days and some days they are not heavy.  Pt states her legs are feeling fine today.  Pt states she continues to have issues with BP.  Pt has had some occasions of dizziness.  She felt like she may pass out at times though has not passed out.    Pt reports improved performance of transfers and improved balance in the water.  Pt regularly takes aquatic classes and reports she is able to perform aquatic movements/exercises with improved balance.  Pt reports 50% improvement in balance and stability overall.     PERTINENT HISTORY: Pt states she has arthritis in back and a Hx of bilat hip bursitis. Dizziness, Stage 3 kidney disease, Migraines, and osteopenia R great toe surgery approx 4-6 years ago.      PAIN:   Are you having pain? No   PRECAUTIONS: Other: Hx of back pain and hip bursitis   WEIGHT BEARING RESTRICTIONS No   FALLS:  Has patient fallen in last 6 months? Yes. Number of falls 3   LIVING ENVIRONMENT: Lives with: lives with their spouse Lives in: 2 story home Stairs: yes Has following equipment at home: Environmental consultant - 2 wheeled     PLOF: Independent   PATIENT GOALS improve strength in her legs and improve balance     OBJECTIVE:    DIAGNOSTIC FINDINGS: N/A     TODAY'S TREATMENT: PHYSICAL PERFORMANCE TESTING:  PATIENT SURVEYS:  LEFS:  Current / Initial:  41/80 / 27/80   5 times sit to stand without UE's: 17 sec / 27 sec  Initial/Current  LOWER EXTREMITY MMT:   MMT Right eval Left eval Right 03/18/2022 Left 03/18/2022  Hip flexion 4/5 4-/5 4+/5 4+/5  Hip extension <3/5 <3/5 3/5 <3/5  Hip abduction 3+/5 3+/5 4-/5 3+/5  Hip adduction        Hip internal rotation        Hip external rotation 4-/5 4-/5 3+/5 3+/5  Knee flexion Had HS cramping 4/5 4-/5 4-/5  Knee extension 4/5 4-/5 4/5 4-/5  Ankle dorsiflexion 4/5 4/5 4+/5 4-/5  Ankle plantarflexion Weak, tested in sitting  Weak, tested in sitting  Improved tolerance with manual resistance Weak, tested in sitting  Ankle inversion        Ankle eversion         (Blank rows = not tested)  GAIT: Assistive device utilized: None Comments: improved gait speed.  Pt had no hesitancy after standing up from chair and initiating her walking.      Therapeutic Exercise: Pt performed:  Nu-step bilat UE/LE's at L2 x 5 mins  Supine bridge 2x10   Shuttle press 2 25's 2x10 reps, 3 25's 2 x 10 reps  Neuro Re-ed Activities: Marching on airex 2x10 with occasional support for LOB and min assist from PT for LOB Ambulating over 4 hurdles with step to gait and reciprocal gait with SBA with rail at side Sidestepping over 4 hurdles with SBA    PATIENT EDUCATION:  Education details:  POC, dx, rationale of exercises, and exercise  form. Person educated: Patient and Spouse Education method: Explanation Education comprehension: verbalized understanding and needs further education     HOME EXERCISE PROGRAM: Access Code: [email or web address] URL: [email or web address] Date: 02/15/2022 Prepared by: Ronny Flurry  Exercises - Supine Active Straight Leg Raise  - 1 x daily - 5-6 x weekly - 2 sets - 10 reps - Supine Bridge  - 1 x daily - 5-6 x weekly - 2 sets - 10 reps - Hooklying  Clamshell with Resistance  - 1 x daily - 4 x weekly - 2 sets - 10 reps - Standing Heel Raise with Support  - 1 x daily - 5-6 x weekly - 2 sets - 10 reps - Romberg Stance  - 1 x daily - 7 x weekly - 3 reps - 30 seconds hold -Staggered stance 1x daily -6-7x weekly - 2 reps - 20 second hold  ASSESSMENT:   CLINICAL IMPRESSION: Pt reports having good days and bad days.  She continues to c/o of a heaviness in her bilat LE's that fluctuates.  Pt continues to have difficulty with performing transfers.  Pt has muscle weakness in bilat LE's.  Pt had a mixture of responses with MMT including some improvements in LE strength, no change in certain areas, and some areas which have worsened.  Pt demonstrates improved functional LE strength with 5x STS test improving from 27 sec to 17 sec.  Pt demonstrates clinically significant improvement in self perceived disability with LEFS improving from 27/80 initially to 41/80 currently.  Pt met all of her STG's and is progressing toward her LTG's.  Pt should benefit from cont skilled PT services to address ongoing goals, improve strength, and to assist in restoring desired level of function.      OBJECTIVE IMPAIRMENTS Abnormal gait, decreased activity tolerance, decreased balance, decreased coordination, decreased endurance, decreased mobility, difficulty walking, and decreased strength.    ACTIVITY LIMITATIONS squatting, stairs, and transfers   PARTICIPATION LIMITATIONS: community activity and walking the dog    PERSONAL FACTORS 3+ comorbidities: arthritis in back, hip bursitis, R great toe surgery  are also affecting patient's functional outcome.    REHAB POTENTIAL: Good   CLINICAL DECISION MAKING: Evolving/moderate complexity   EVALUATION COMPLEXITY: Moderate     GOALS:     SHORT TERM GOALS: Target date:  03/06/2022 Pt will be independent and compliant with HEP for improved strength, balance, and functional mobility. Baseline: Goal status: GOAL MET   2.  Pt will perform 5x STS test in no > 17 sec for improved functional LE strength and performance of sit/stand transfers. Baseline: GOAL MET   3.  Pt will report at least a 25% improvement in her balance and stability overall.  Baseline:  Goal status: GOAL MET       LONG TERM GOALS: Target date: 04/15/2022    Pt will be able to perform sit/stand transfers including toilet transfers without difficulty.  Baseline:  Goal status: ONGOING   2.  Pt will demo improved bilat hip strength to at least 4+/5 and bilat knee strength to 5/5 MMT for improved performance of functional mobility.   Baseline:  Goal status: ONGOING   3.  Pt will perform 5x STS test in no > 12 seconds for improved functional LE strength.  Baseline:  Goal status: PROGRESSING   4.  Pt will be able to perform tandem stance for 20 sec without UE assist independently for improve balance and proprioception. Baseline:  Goal status: NOT ASSESSED   5.  Pt will demo improved quality of gait including increased gait speed.  Baseline:  Goal status: PROGRESSING   6.  Pt will report at least a 60-70% improvement in her balance and stability with daily mobility.  Baseline:  Goal status: PARTIALLY MET     PLAN: PT FREQUENCY: 2x/week   PT DURATION: 4 weeks   PLANNED INTERVENTIONS: Therapeutic exercises, Therapeutic activity, Neuromuscular re-education, Balance training, Gait training, Patient/Family education, Stair training, Aquatic Therapy, Electrical stimulation,  Cryotherapy, Moist heat, Manual therapy, and Re-evaluation   PLAN FOR NEXT SESSION: continue with LE strengthening and balance training.  Pt may also benefit from aquatic therapy.  Pt is waiting to be scheduled to see neurologist.  She is seeing cardiologist on 8/14.     Selinda Michaels III PT, DPT 03/18/22 4:42 PM

## 2022-03-19 NOTE — Progress Notes (Unsigned)
Cardiology Office Note:    Date:  03/19/2022   ID:  Dana Finley, DOB [birth year], MRN [id number]  PCP:  [place], Raymond J, [place] [facility] Cardiologist:  None {  Referring MD: Dana Lips, MD    History of Present Illness:    Dana Finley is a 66 y.o. female with a hx of anxiety, HTN, depression, and migraines who was referred by Dr. Hollie Finley for further evaluation of syncope.  Was seen by Dr. Hollie Finley in clinic on 02/27/22 where she was dizzy spells, lightheadedness and presyncope. Her HCTZ was stopped and she was referred to Cardiology for further work-up.  Today, ***  Past Medical History:  Diagnosis Date   ADD (attention deficit disorder)    Anxiety    Depression    Diverticula of colon    Hemorrhoids    History of colonic polyps hyperplastic   Hypertension    Hypothyroidism    IBS (irritable bowel syndrome)    Insomnia, persistent    Migraine headache without aura    lumbosacral disc desease   Osteopenia    Serrated adenoma of colon 06/2012   Weight loss     Past Surgical History:  Procedure Laterality Date   ABDOMINAL HYSTERECTOMY     1996   APPENDECTOMY     CATARACT EXTRACTION     bilateral   COLONOSCOPY     EYE SURGERY     muscle surgery; has had approx 18 surgeries on both eyes   POLYPECTOMY      Current Medications: No outpatient medications have been marked as taking for the 03/25/22 encounter (Appointment) with Dana Bergeron, MD.     Allergies:   Codeine, Lisinopril, and Morphine and related   Social History   Socioeconomic History   Marital status: Married    Spouse name: Not on file   Number of children: 2   Years of education: Not on file   Highest education level: Not on file  Occupational History   Occupation: home    Employer: UNEMPLOYED  Tobacco Use   Smoking status: Former    Types: Cigarettes    Quit date: 06/12/1977    Years since quitting: 44.7   Smokeless tobacco: Never  Substance and Sexual  Activity   Alcohol use: Yes    Comment: occasional   Drug use: No   Sexual activity: Not on file  Other Topics Concern   Not on file  Social History Narrative   Not on file   Social Determinants of Health   Financial Resource Strain: Not on file  Food Insecurity: Not on file  Transportation Needs: Not on file  Physical Activity: Not on file  Stress: Not on file  Social Connections: Not on file     Family History: The patient's ***family history includes Colon cancer (age of onset: 54) in her cousin; Colon cancer (age of onset: 73) in her maternal uncle and sister; Colon polyps in her son; Diabetes in her mother; Early death in her sister; Heart disease in her mother; Leukemia in her brother; Pancreatic cancer (age of onset: 58) in her father; Stomach cancer (age of onset: 70) in her maternal uncle; Stroke in her mother. There is no history of Rectal cancer.  ROS:   Please see the history of present illness.    *** All other systems reviewed and are negative.  EKGs/Labs/Other Studies Reviewed:    The following studies were reviewed today: ***  EKG:  EKG  is *** ordered today.  The ekg ordered today demonstrates ***  Recent Labs: 02/18/2022: ALT 10; BUN 18; Creatinine, Ser 1.12; Hemoglobin 14.3; Platelets 269; Potassium 4.4; Sodium 141  Recent Lipid Panel    Component Value Date/Time   CHOL 223 (H) 02/18/2022 1103   TRIG 123 02/18/2022 1103   HDL 74 02/18/2022 1103   CHOLHDL 3.0 02/18/2022 1103   CHOLHDL 3 10/09/2010 0847   VLDL 18.6 10/09/2010 0847   LDLCALC 128 (H) 02/18/2022 1103   LDLDIRECT 131.0 10/09/2010 0847     Risk Assessment/Calculations:   {Does this patient have ATRIAL FIBRILLATION?[phone]}       Physical Exam:    VS:  There were no vitals taken for this visit.    Wt Readings from Last 3 Encounters:  02/26/22 161 lb 9.6 oz (73.3 kg)  01/09/22 164 lb 3.2 oz (74.5 kg)  12/18/21 164 lb 4.8 oz (74.5 kg)     GEN: *** Well nourished, well  developed in no acute distress HEENT: Normal NECK: No JVD; No carotid bruits LYMPHATICS: No lymphadenopathy CARDIAC: ***RRR, no murmurs, rubs, gallops RESPIRATORY:  Clear to auscultation without rales, wheezing or rhonchi  ABDOMEN: Soft, non-tender, non-distended MUSCULOSKELETAL:  No edema; No deformity  SKIN: Warm and dry NEUROLOGIC:  Alert and oriented x 3 PSYCHIATRIC:  Normal affect   ASSESSMENT:    No diagnosis found. PLAN:    In order of problems listed above:  #Syncope: -Check cardiac monitor -Check TTE  #HTN: -Continue lisinopril '10mg'$  daily  #HLD: -Continue pravastatin '40mg'$  daily       {Are you ordering a CV Procedure (e.g. stress test, cath, DCCV, TEE, etc)?   Press F2        :[id number]}    Medication Adjustments/Labs and Tests Ordered: Current medicines are reviewed at length with the patient today.  Concerns regarding medicines are outlined above.  No orders of the defined types were placed in this encounter.  No orders of the defined types were placed in this encounter.   There are no Patient Instructions on file for this visit.   Signed, Dana Bergeron, MD  03/19/2022 2:48 PM    [place]

## 2022-03-22 ENCOUNTER — Encounter (HOSPITAL_BASED_OUTPATIENT_CLINIC_OR_DEPARTMENT_OTHER): Payer: Self-pay | Admitting: Physical Therapy

## 2022-03-22 ENCOUNTER — Ambulatory Visit (HOSPITAL_BASED_OUTPATIENT_CLINIC_OR_DEPARTMENT_OTHER): Payer: Medicare Other | Admitting: Physical Therapy

## 2022-03-22 DIAGNOSIS — R278 Other lack of coordination: Secondary | ICD-10-CM

## 2022-03-22 DIAGNOSIS — M6281 Muscle weakness (generalized): Secondary | ICD-10-CM | POA: Diagnosis not present

## 2022-03-22 NOTE — Therapy (Signed)
OUTPATIENT PHYSICAL THERAPY TREATMENT NOTE     Patient Name: Dana Finley MRN: [id number] DOB:[birth year], 66 y.o., female Today's Date: 03/22/2022    END OF SESSION:   PT End of Session - 03/22/22 1221     Visit Number 11    Number of Visits 18    Date for PT Re-Evaluation 04/15/22    Authorization Type MCR A and B    PT Start Time 1151    PT Stop Time 1229    PT Time Calculation (min) 38 min    Activity Tolerance Patient tolerated treatment well    Behavior During Therapy WFL for tasks assessed/performed               Past Medical History:  Diagnosis Date   ADD (attention deficit disorder)    Anxiety    Depression    Diverticula of colon    Hemorrhoids    History of colonic polyps hyperplastic   Hypertension    Hypothyroidism    IBS (irritable bowel syndrome)    Insomnia, persistent    Migraine headache without aura    lumbosacral disc desease   Osteopenia    Serrated adenoma of colon 06/2012   Weight loss    Past Surgical History:  Procedure Laterality Date   ABDOMINAL HYSTERECTOMY     1996   APPENDECTOMY     CATARACT EXTRACTION     bilateral   COLONOSCOPY     EYE SURGERY     muscle surgery; has had approx 18 surgeries on both eyes   POLYPECTOMY     Patient Active Problem List   Diagnosis Date Noted   Wellness examination 02/26/2022   Osteopenia 02/26/2022   Leg weakness, bilateral 01/09/2022   Fall 12/18/2021   Skin lesion 11/26/2021   Pain of left hip joint 09/05/2021   Chronic kidney disease 08/27/2021   Hypothyroidism 08/27/2021   Bilateral high frequency sensorineural hearing loss 07/05/2019   Hallux rigidus of right foot 03/24/2018   Acute upper respiratory infection 05/31/2016   Multinodular goiter 02/27/2016   Family history of colon cancer 08/31/2014   Chalazion of right lower eyelid 10/08/2012   Meibomian gland disease 10/08/2012   Hashimoto's thyroiditis 07/10/2011   RHINITIS 12/27/2009   NUMBNESS 12/27/2009   PERIPHERAL  NEUROPATHY, LOWER EXTREMITY, 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   ACHILLES TENDINITIS 12/19/2009   HERNIATED LUMBOSACRAL DISC 12/07/2009   [place] DISEASE, LUMBOSACRAL SPINE 12/07/2009   ESSENTIAL HYPERTENSION, BENIGN 10/08/2007   IRRITABLE BOWEL SYNDROME 09/09/2007   DIARRHEA 09/09/2007   WEIGHT LOSS 08/20/2007   ANXIETY DEPRESSION 08/12/2007   INSOMNIA, CHRONIC 06/30/2007   OSTEOPENIA 06/30/2007   ADD (attention deficit disorder) 12/19/2006   Migraines 12/19/2006   COLONIC POLYPS, HYPERPLASTIC 11/19/2005   Colon polyps 11/19/2005    PCP: de [place], Raymond J, MD    REFERRING PROVIDER: [place], Raymond J, MD     REFERRING DIAG: (ICD-10-CM) - Leg weakness, bilateral   THERAPY DIAG:  Muscle weakness (generalized)   Other lack of coordination   Rationale for Evaluation and Treatment Rehabilitation   ONSET DATE: MD visit/order 01/09/2022 ; onset 1.5 years ago   SUBJECTIVE:    SUBJECTIVE STATEMENT: Pt denies any adverse effects after prior Rx.  Pt reports compliance with HEP.  Pt states she is having a good day.  Pt reports improved performance of transfers and improved balance in the water.  Pt regularly takes aquatic classes and reports she is able to perform aquatic movements/exercises with improved balance.  Pt  did well with her aquatic class today.  Pt sees cardiologist prior to next PT appt.     PERTINENT HISTORY: Pt states she has arthritis in back and a Hx of bilat hip bursitis. Dizziness, Stage 3 kidney disease, Migraines, and osteopenia R great toe surgery approx 4-6 years ago.      PAIN:  Are you having pain? No   PRECAUTIONS: Other: Hx of back pain and hip bursitis   WEIGHT BEARING RESTRICTIONS No   FALLS:  Has patient fallen in last 6 months? Yes. Number of falls 3   LIVING ENVIRONMENT: Lives with: lives with their spouse Lives in: 2 story home Stairs: yes Has following equipment at home: Environmental consultant - 2 wheeled     PLOF: Independent   PATIENT GOALS improve strength in her  legs and improve balance     OBJECTIVE:    DIAGNOSTIC FINDINGS: N/A     TODAY'S TREATMENT: Pt requested to check her BP at end of Rx:  129/75, HR:  93  Therapeutic Exercise: Pt performed:  Nu-step bilat UE/LE's at L4 x 4 mins  Supine bridge 2x10   Shuttle press 3 25's x15 reps, 2 25's and 1 12 2  x 10 reps  Mini squats 2x10 with hands on rail S/L clams 2 x 10 reps each           Heel raises on airex 2x10 reps  Neuro Re-ed Activities: Marching on airex 2x10 with occasional support for LOB  SLS with light UE support 2x20 sec bilat Ambulating over 4 hurdles with reciprocal gait with SBA with rail at side Sidestepping over 4 hurdles with SBA Tandem gait with occasional UE support     PATIENT EDUCATION:  Education details:  POC, dx, rationale of exercises, and exercise form. Person educated: Patient and Spouse Education method: Explanation, demonstration, verbal cues Education comprehension: verbalized understanding, returned demonstration, verbal cues required, and needs further education     HOME EXERCISE PROGRAM: Access Code: [email or web address] URL: [email or web address] Date: 02/15/2022 Prepared by: Ronny Flurry  Exercises - Supine Active Straight Leg Raise  - 1 x daily - 5-6 x weekly - 2 sets - 10 reps - Supine Bridge  - 1 x daily - 5-6 x weekly - 2 sets - 10 reps - Hooklying Clamshell with Resistance  - 1 x daily - 4 x weekly - 2 sets - 10 reps - Standing Heel Raise with Support  - 1 x daily - 5-6 x weekly - 2 sets - 10 reps - Romberg Stance  - 1 x daily - 7 x weekly - 3 reps - 30 seconds hold -Staggered stance 1x daily -6-7x weekly - 2 reps - 20 second hold  ASSESSMENT:   CLINICAL IMPRESSION: Pt presents to Rx stating she is having a good day.  Pt demonstrates good tolerance to exercises and performed exercises well today.  Pt was fatigued with exercises and had multiple seated rest breaks during Rx.  Pt is improving with balance demonstrating improved  stability and stepping with walking over hurdles fwd and laterally.  Pt had difficulty with tandem gait having LOB and requiring UE support.  Pt was fatigued at the end of Rx and asked PT to check her BP.  Pt should benefit from cont skilled PT services to address ongoing goals, improve strength, and to assist in restoring desired level of function.       OBJECTIVE IMPAIRMENTS Abnormal gait, decreased activity tolerance, decreased balance, decreased coordination, decreased endurance, decreased mobility, difficulty walking, and  decreased strength.    ACTIVITY LIMITATIONS squatting, stairs, and transfers   PARTICIPATION LIMITATIONS: community activity and walking the dog   PERSONAL FACTORS 3+ comorbidities: arthritis in back, hip bursitis, R great toe surgery  are also affecting patient's functional outcome.    REHAB POTENTIAL: Good   CLINICAL DECISION MAKING: Evolving/moderate complexity   EVALUATION COMPLEXITY: Moderate     GOALS:     SHORT TERM GOALS: Target date:  03/06/2022 Pt will be independent and compliant with HEP for improved strength, balance, and functional mobility. Baseline: Goal status: GOAL MET   2.  Pt will perform 5x STS test in no > 17 sec for improved functional LE strength and performance of sit/stand transfers. Baseline: GOAL MET   3.  Pt will report at least a 25% improvement in her balance and stability overall.  Baseline:  Goal status: GOAL MET       LONG TERM GOALS: Target date: 04/19/2022    Pt will be able to perform sit/stand transfers including toilet transfers without difficulty.  Baseline:  Goal status: ONGOING   2.  Pt will demo improved bilat hip strength to at least 4+/5 and bilat knee strength to 5/5 MMT for improved performance of functional mobility.   Baseline:  Goal status: ONGOING   3.  Pt will perform 5x STS test in no > 12 seconds for improved functional LE strength.  Baseline:  Goal status: PROGRESSING   4.  Pt will be able to  perform tandem stance for 20 sec without UE assist independently for improve balance and proprioception. Baseline:  Goal status: NOT ASSESSED   5.  Pt will demo improved quality of gait including increased gait speed.  Baseline:  Goal status: PROGRESSING   6.  Pt will report at least a 60-70% improvement in her balance and stability with daily mobility.  Baseline:  Goal status: PARTIALLY MET     PLAN: PT FREQUENCY: 2x/week   PT DURATION: 4 weeks   PLANNED INTERVENTIONS: Therapeutic exercises, Therapeutic activity, Neuromuscular re-education, Balance training, Gait training, Patient/Family education, Stair training, Aquatic Therapy, Electrical stimulation, Cryotherapy, Moist heat, Manual therapy, and Re-evaluation   PLAN FOR NEXT SESSION: continue with LE strengthening and balance training.  Pt may also benefit from aquatic therapy.  Pt is waiting to be scheduled to see neurologist.  She is seeing cardiologist on 8/14.     Selinda Michaels III PT, DPT 03/22/22 12:49 PM

## 2022-03-25 ENCOUNTER — Encounter (HOSPITAL_BASED_OUTPATIENT_CLINIC_OR_DEPARTMENT_OTHER): Payer: Self-pay | Admitting: Physical Therapy

## 2022-03-25 ENCOUNTER — Encounter: Payer: Self-pay | Admitting: *Deleted

## 2022-03-25 ENCOUNTER — Ambulatory Visit (INDEPENDENT_AMBULATORY_CARE_PROVIDER_SITE_OTHER): Payer: Medicare Other

## 2022-03-25 ENCOUNTER — Ambulatory Visit (INDEPENDENT_AMBULATORY_CARE_PROVIDER_SITE_OTHER): Payer: Medicare Other | Admitting: Cardiology

## 2022-03-25 ENCOUNTER — Encounter: Payer: Self-pay | Admitting: Cardiology

## 2022-03-25 ENCOUNTER — Telehealth: Payer: Self-pay | Admitting: *Deleted

## 2022-03-25 ENCOUNTER — Ambulatory Visit (HOSPITAL_BASED_OUTPATIENT_CLINIC_OR_DEPARTMENT_OTHER): Payer: Medicare Other | Admitting: Physical Therapy

## 2022-03-25 VITALS — BP 122/68 | HR 68 | Ht 67.0 in | Wt 163.4 lb

## 2022-03-25 DIAGNOSIS — M6281 Muscle weakness (generalized): Secondary | ICD-10-CM | POA: Diagnosis not present

## 2022-03-25 DIAGNOSIS — R0602 Shortness of breath: Secondary | ICD-10-CM | POA: Diagnosis not present

## 2022-03-25 DIAGNOSIS — N1832 Chronic kidney disease, stage 3b: Secondary | ICD-10-CM | POA: Diagnosis not present

## 2022-03-25 DIAGNOSIS — R278 Other lack of coordination: Secondary | ICD-10-CM

## 2022-03-25 DIAGNOSIS — R55 Syncope and collapse: Secondary | ICD-10-CM

## 2022-03-25 DIAGNOSIS — E782 Mixed hyperlipidemia: Secondary | ICD-10-CM

## 2022-03-25 DIAGNOSIS — I10 Essential (primary) hypertension: Secondary | ICD-10-CM | POA: Diagnosis not present

## 2022-03-25 NOTE — Therapy (Signed)
OUTPATIENT PHYSICAL THERAPY TREATMENT NOTE     Patient Name: Dana Finley MRN: [id number] DOB:06-07-1956, 66 y.o., female Today's Date: 03/26/2022    END OF SESSION:   PT End of Session - 03/25/22 1543     Visit Number 12    Number of Visits 18    Date for PT Re-Evaluation 04/15/22    Authorization Type MCR A and B    PT Start Time 1529    PT Stop Time 1610    PT Time Calculation (min) 41 min    Activity Tolerance Patient tolerated treatment well    Behavior During Therapy WFL for tasks assessed/performed                Past Medical History:  Diagnosis Date   ADD (attention deficit disorder)    Anxiety    Depression    Diverticula of colon    Hemorrhoids    History of colonic polyps hyperplastic   Hypertension    Hypothyroidism    IBS (irritable bowel syndrome)    Insomnia, persistent    Migraine headache without aura    lumbosacral disc desease   Osteopenia    Serrated adenoma of colon 06/2012   Weight loss    Past Surgical History:  Procedure Laterality Date   ABDOMINAL HYSTERECTOMY     1996   APPENDECTOMY     CATARACT EXTRACTION     bilateral   COLONOSCOPY     EYE SURGERY     muscle surgery; has had approx 18 surgeries on both eyes   POLYPECTOMY     Patient Active Problem List   Diagnosis Date Noted   Wellness examination 02/26/2022   Osteopenia 02/26/2022   Leg weakness, bilateral 01/09/2022   Fall 12/18/2021   Skin lesion 11/26/2021   Pain of left hip joint 09/05/2021   Chronic kidney disease 08/27/2021   Hypothyroidism 08/27/2021   Bilateral high frequency sensorineural hearing loss 07/05/2019   Hallux rigidus of right foot 03/24/2018   Acute upper respiratory infection 05/31/2016   Multinodular goiter 02/27/2016   Family history of colon cancer 08/31/2014   Chalazion of right lower eyelid 10/08/2012   Meibomian gland disease 10/08/2012   Hashimoto's thyroiditis 07/10/2011   RHINITIS 12/27/2009   NUMBNESS 12/27/2009   PERIPHERAL  NEUROPATHY, LOWER EXTREMITY, 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   ACHILLES TENDINITIS 12/19/2009   HERNIATED LUMBOSACRAL DISC 12/07/2009   [place] DISEASE, LUMBOSACRAL SPINE 12/07/2009   ESSENTIAL HYPERTENSION, BENIGN 10/08/2007   IRRITABLE BOWEL SYNDROME 09/09/2007   DIARRHEA 09/09/2007   WEIGHT LOSS 08/20/2007   ANXIETY DEPRESSION 08/12/2007   INSOMNIA, CHRONIC 06/30/2007   OSTEOPENIA 06/30/2007   ADD (attention deficit disorder) 12/19/2006   Migraines 12/19/2006   COLONIC POLYPS, HYPERPLASTIC 11/19/2005   Colon polyps 11/19/2005    PCP: de [place], Raymond J, MD    REFERRING PROVIDER: [place], Raymond J, MD     REFERRING DIAG: (ICD-10-CM) - Leg weakness, bilateral   THERAPY DIAG:  Muscle weakness (generalized)   Other lack of coordination   Rationale for Evaluation and Treatment Rehabilitation   ONSET DATE: MD visit/order 01/09/2022 ; onset 1.5 years ago   SUBJECTIVE:    SUBJECTIVE STATEMENT: Pt reports compliance with HEP.  Pt reports her lower back and knees were hurting after prior Rx.  She thinks it was from the increased resistance with Nu-step and the shuttle press.  Pt states she feels that she is getting better.     Pt saw the cardiologist who thinks she has  orthostatic hypotension.  Cardiologist instructed her to not stand up too quickly, but to sit for a moment before she stands up.  Pt reports she was also informed to drink a lot of water.  Cardiologist ordered an echocardiogram and heart monitor.       PERTINENT HISTORY: Pt states she has arthritis in back and a Hx of bilat hip bursitis. Dizziness, Stage 3 kidney disease, Migraines, and osteopenia R great toe surgery approx 4-6 years ago.      PAIN:  Are you having pain? No   PRECAUTIONS: Other: Hx of back pain and hip bursitis   WEIGHT BEARING RESTRICTIONS No   FALLS:  Has patient fallen in last 6 months? Yes. Number of falls 3   LIVING ENVIRONMENT: Lives with: lives with their spouse Lives in: 2 story home Stairs:  yes Has following equipment at home: Environmental consultant - 2 wheeled     PLOF: Independent   PATIENT GOALS improve strength in her legs and improve balance     OBJECTIVE:    DIAGNOSTIC FINDINGS: N/A     TODAY'S TREATMENT:   Therapeutic Exercise: Pt performed:  Nu-step bilat UE/LE's at L2 x 5 mins  Supine bridge 2x10   Cybex leg press 2x10 reps at 30#  Mini squats 2x10 with hands on rail S/L clams 2 x 10 reps each S/L hip abduction 2x10 reps           Heel raises on airex 2x10 reps  Neuro Re-ed Activities: Marching on airex 2x10 with occasional support for LOB  SLS with light UE support 3x20 sec bilat Ambulating over 4 hurdles with reciprocal gait and up/down a 6 inch step with SBA with rail at side Sidestepping over 4 hurdles with SBA Tandem gait with occasional UE support     PATIENT EDUCATION:  Education details:  POC, dx, rationale of exercises, and exercise form. Person educated: Patient and Spouse Education method: Explanation, demonstration, verbal cues Education comprehension: verbalized understanding, returned demonstration, verbal cues required, and needs further education     HOME EXERCISE PROGRAM: Access Code: [email or web address] URL: [email or web address] Date: 02/15/2022 Prepared by: Ronny Flurry  Exercises - Supine Active Straight Leg Raise  - 1 x daily - 5-6 x weekly - 2 sets - 10 reps - Supine Bridge  - 1 x daily - 5-6 x weekly - 2 sets - 10 reps - Hooklying Clamshell with Resistance  - 1 x daily - 4 x weekly - 2 sets - 10 reps - Standing Heel Raise with Support  - 1 x daily - 5-6 x weekly - 2 sets - 10 reps - Romberg Stance  - 1 x daily - 7 x weekly - 3 reps - 30 seconds hold -Staggered stance 1x daily -6-7x weekly - 2 reps - 20 second hold  ASSESSMENT:   CLINICAL IMPRESSION: Pt saw cardiologist who thinks she has orthostatic hypotension. Pt is getting an echo done and will also use a heart monitor for a certain time period.  Pt gave good effort  with all exercises and performed exercises well with cuing and instruction in correct form.  Pt was fatigued with exercises and took short rest breaks t/o Rx.  Pt is improving with balance demonstrating improved stability and stepping with walking over hurdles fwd and laterally.  Pt has difficulty with tandem gait having LOB though did show some improvement compared to prior Rx.  Pt had no c/o's after Rx.  Pt should benefit from cont skilled PT services to address ongoing  goals, improve strength, and to assist in restoring desired level of function.       OBJECTIVE IMPAIRMENTS Abnormal gait, decreased activity tolerance, decreased balance, decreased coordination, decreased endurance, decreased mobility, difficulty walking, and decreased strength.    ACTIVITY LIMITATIONS squatting, stairs, and transfers   PARTICIPATION LIMITATIONS: community activity and walking the dog   PERSONAL FACTORS 3+ comorbidities: arthritis in back, hip bursitis, R great toe surgery  are also affecting patient's functional outcome.    REHAB POTENTIAL: Good   CLINICAL DECISION MAKING: Evolving/moderate complexity   EVALUATION COMPLEXITY: Moderate     GOALS:     SHORT TERM GOALS: Target date:  03/06/2022 Pt will be independent and compliant with HEP for improved strength, balance, and functional mobility. Baseline: Goal status: GOAL MET   2.  Pt will perform 5x STS test in no > 17 sec for improved functional LE strength and performance of sit/stand transfers. Baseline: GOAL MET   3.  Pt will report at least a 25% improvement in her balance and stability overall.  Baseline:  Goal status: GOAL MET       LONG TERM GOALS: Target date: 04/23/2022    Pt will be able to perform sit/stand transfers including toilet transfers without difficulty.  Baseline:  Goal status: ONGOING   2.  Pt will demo improved bilat hip strength to at least 4+/5 and bilat knee strength to 5/5 MMT for improved performance of  functional mobility.   Baseline:  Goal status: ONGOING   3.  Pt will perform 5x STS test in no > 12 seconds for improved functional LE strength.  Baseline:  Goal status: PROGRESSING   4.  Pt will be able to perform tandem stance for 20 sec without UE assist independently for improve balance and proprioception. Baseline:  Goal status: NOT ASSESSED   5.  Pt will demo improved quality of gait including increased gait speed.  Baseline:  Goal status: PROGRESSING   6.  Pt will report at least a 60-70% improvement in her balance and stability with daily mobility.  Baseline:  Goal status: PARTIALLY MET     PLAN: PT FREQUENCY: 2x/week   PT DURATION: 4 weeks   PLANNED INTERVENTIONS: Therapeutic exercises, Therapeutic activity, Neuromuscular re-education, Balance training, Gait training, Patient/Family education, Stair training, Aquatic Therapy, Electrical stimulation, Cryotherapy, Moist heat, Manual therapy, and Re-evaluation   PLAN FOR NEXT SESSION: continue with LE strengthening and balance training.  Pt may also benefit from aquatic therapy.  Pt is waiting to be scheduled to see neurologist.      Selinda Michaels III PT, DPT 03/26/22 10:16 PM

## 2022-03-25 NOTE — Patient Instructions (Addendum)
Medication Instructions:   Your physician recommends that you continue on your current medications as directed. Please refer to the Current Medication list given to you today.  *If you need a refill on your cardiac medications before your next appointment, please call your pharmacy*   Testing/Procedures:  Your physician has requested that you have an echocardiogram. Echocardiography is a painless test that uses sound waves to create images of your heart. It provides your doctor with information about the size and shape of your heart and how well your heart's chambers and valves are working. This procedure takes approximately one hour. There are no restrictions for this procedure.   Your physician has requested that you have a lexiscan myoview. For further information please visit [email or web address]. Please follow instruction sheet, as given.   ZIO XT- Long Term Monitor Instructions  Your physician has requested you wear a ZIO patch monitor for 7 days.  This is a single patch monitor. [employer] supplies one patch monitor per enrollment. Additional stickers are not available. Please do not apply patch if you will be having a Nuclear Stress Test,  Echocardiogram, Cardiac CT, MRI, or Chest Xray during the period you would be wearing the  monitor. The patch cannot be worn during these tests. You cannot remove and re-apply the  ZIO XT patch monitor.  Your ZIO patch monitor will be mailed 3 day USPS to your address on file. It may take 3-5 days  to receive your monitor after you have been enrolled.  Once you have received your monitor, please review the enclosed instructions. Your monitor  has already been registered assigning a specific monitor serial # to you.  Billing and Patient Assistance Program Information  We have supplied [employer] with any of your insurance information on file for billing purposes. [employer] offers a sliding scale Patient Assistance Program for patients that do not have   insurance, or whose insurance does not completely cover the cost of the ZIO monitor.  You must apply for the Patient Assistance Program to qualify for this discounted rate.  To apply, please call [employer] at [phone], select option 4, select option 2, ask to apply for  Patient Assistance Program. Theodore Demark will ask your household income, and how many people  are in your household. They will quote your out-of-pocket cost based on that information.  [employer] will also be able to set up a 25-month interest-free payment plan if needed.  Applying the monitor   Shave hair from upper left chest.  Hold abrader disc by orange tab. Rub abrader in 40 strokes over the upper left chest as  indicated in your monitor instructions.  Clean area with 4 enclosed alcohol pads. Let dry.  Apply patch as indicated in monitor instructions. Patch will be placed under collarbone on left  side of chest with arrow pointing upward.  Rub patch adhesive wings for 2 minutes. Remove white label marked "1". Remove the white  label marked "2". Rub patch adhesive wings for 2 additional minutes.  While looking in a mirror, press and release button in center of patch. A small green light will  flash 3-4 times. This will be your only indicator that the monitor has been turned on.  Do not shower for the first 24 hours. You may shower after the first 24 hours.  Press the button if you feel a symptom. You will hear a small click. Record Date, Time and  Symptom in the Patient Logbook.  When you are ready to remove the patch,  follow instructions on the last 2 pages of Patient  Logbook. Stick patch monitor onto the last page of Patient Logbook.  Place Patient Logbook in the blue and white box. Use locking tab on box and tape box closed  securely. The blue and white box has prepaid postage on it. Please place it in the mailbox as  soon as possible. Your physician should have your test results approximately 7 days after the  monitor  has been mailed back to [facility].  Call [place] at [phone] if you have questions regarding  your ZIO XT patch monitor. Call them immediately if you see an orange light blinking on your  monitor.  If your monitor falls off in less than 4 days, contact our Monitor department at [phone].  If your monitor becomes loose or falls off after 4 days call [employer] at [phone] for  suggestions on securing your monitor    Follow-Up: At [facility], you and your health needs are our priority.  As part of our continuing mission to provide you with exceptional heart care, we have created designated Provider Care Teams.  These Care Teams include your primary Cardiologist (physician) and Advanced Practice Providers (APPs -  Physician Assistants and Nurse Practitioners) who all work together to provide you with the care you need, when you need it.  We recommend signing up for the patient portal called "MyChart".  Sign up information is provided on this After Visit Summary.  MyChart is used to connect with patients for Virtual Visits (Telemedicine).  Patients are able to view lab/test results, encounter notes, upcoming appointments, etc.  Non-urgent messages can be sent to your provider as well.   To learn more about what you can do with MyChart, go to [email or web address].    Your next appointment:   6 month(s)  The format for your next appointment:   In Person  Provider:   { With an Extender in the office   Orthostatic Hypotension Blood pressure is a measurement of how strongly, or weakly, your circulating blood is pressing against the walls of your arteries. Orthostatic hypotension is a drop in blood pressure that can happen when you change positions, such as when you go from lying down to standing. Arteries are blood vessels that carry blood from your heart throughout your body. When blood pressure is too low, you may not get enough blood to your brain  or to the rest of your organs. Orthostatic hypotension can cause light-headedness, sweating, rapid heartbeat, blurred vision, and fainting. These symptoms require further investigation into the cause. What are the causes? Orthostatic hypotension can be caused by many things, including: Sudden changes in posture, such as standing up quickly after you have been sitting or lying down. Loss of blood (anemia) or loss of body fluids (dehydration). Heart problems, neurologic problems, or hormone problems. Pregnancy. Aging. The risk for this condition increases as you get older. Severe infection (sepsis). Certain medicines, such as medicines for high blood pressure or medicines that make the body lose excess fluids (diuretics). What are the signs or symptoms? Symptoms of this condition may include: Weakness, light-headedness, or dizziness. Sweating. Blurred vision. Tiredness (fatigue). Rapid heartbeat. Fainting, in severe cases. How is this diagnosed? This condition is diagnosed based on: Your symptoms and medical history. Your blood pressure measurements. Your health care provider will check your blood pressure when you are: Lying down. Sitting. Standing. A blood pressure reading is recorded as two numbers, such as "120 over 80" (or 120/80).  The first ("top") number is called the systolic pressure. It is a measure of the pressure in your arteries as your heart beats. The second ("bottom") number is called the diastolic pressure. It is a measure of the pressure in your arteries when your heart relaxes between beats. Blood pressure is measured in a unit called mmHg. Healthy blood pressure for most adults is 120/80 mmHg. Orthostatic hypotension is defined as a 20 mmHg drop in systolic pressure or a 10 mmHg drop in diastolic pressure within 3 minutes of standing. Other information or tests that may be used to diagnose orthostatic hypotension include: Your other vital signs, such as your heart rate  and temperature. Blood tests. An electrocardiogram (ECG) or echocardiogram. A Holter monitor. This is a device you wear that records your heart rhythm continuously, usually for 24-48 hours. Tilt table test. For this test, you will be safely secured to a table that moves you from a lying position to an upright position. Your heart rhythm and blood pressure will be monitored during the test. How is this treated? This condition may be treated by: Changing your diet. This may involve eating more salt (sodium) or drinking more water. Changing the dosage of certain medicines you are taking that might be lowering your blood pressure. Correcting the underlying reason for the orthostatic hypotension. Wearing compression stockings. Taking medicines to raise your blood pressure. Avoiding actions that trigger symptoms. Follow these instructions at home: Medicines Take over-the-counter and prescription medicines only as told by your health care provider. Follow instructions from your health care provider about changing the dosage of your current medicines, if this applies. Do not stop or adjust any of your medicines on your own. Eating and drinking Illustration of a person drinking a glass of water.   Drink enough fluid to keep your urine pale yellow. Eat extra salt only as directed. Do not add extra salt to your diet unless advised by your health care provider. Eat frequent, small meals. Avoid standing up suddenly after eating. General instructions Compression stockings on a person's lower legs.   Get up slowly from lying down or sitting positions. This gives your blood pressure a chance to adjust. Avoid hot showers and excessive heat as directed by your health care provider. Engage in regular physical activity as directed by your health care provider. If you have compression stockings, wear them as told. Keep all follow-up visits. This is important. Contact a health care provider if: You  have a fever for more than 2-3 days. You feel more thirsty than usual. You feel dizzy or weak. Get help right away if: You have chest pain. You have a fast or irregular heartbeat. You become sweaty or feel light-headed. You feel short of breath. You faint. You have any symptoms of a stroke. "BE FAST" is an easy way to remember the main warning signs of a stroke: B - Balance. Signs are dizziness, sudden trouble walking, or loss of balance. E - Eyes. Signs are trouble seeing or a sudden change in vision. F - Face. Signs are sudden weakness or numbness of the face, or the face or eyelid drooping on one side. A - Arms. Signs are weakness or numbness in an arm. This happens suddenly and usually on one side of the body. S - Speech. Signs are sudden trouble speaking, slurred speech, or trouble understanding what people say. T - Time. Time to call emergency services. Write down what time symptoms started. You have other signs of a stroke, such  as: A sudden, severe headache with no known cause. Nausea or vomiting. Seizure. These symptoms may represent a serious problem that is an emergency. Do not wait to see if the symptoms will go away. Get medical help right away. Call your local emergency services (911 in the [place] Do not drive yourself to the hospital. Summary Orthostatic hypotension is a sudden drop in blood pressure. It can cause light-headedness, sweating, rapid heartbeat, blurred vision, and fainting. Orthostatic hypotension can be diagnosed by having your blood pressure taken while lying down, sitting, and then standing. Treatment may involve changing your diet, wearing compression stockings, sitting up slowly, adjusting your medicines, or correcting the underlying reason for the orthostatic hypotension. Get help right away if you have chest pain, a fast or irregular heartbeat, or symptoms of a stroke. This information is not intended to replace advice given to you by your health care  provider. Make sure you discuss any questions you have with your health care provider. Document Revised: 10/12/2020 Document Reviewed: 10/12/2020 Elsevier Patient Education  [place]

## 2022-03-25 NOTE — Progress Notes (Unsigned)
Enrolled for [employer] to mail a ZIO XT long term holter monitor to the patients address on file.  

## 2022-03-25 NOTE — Telephone Encounter (Signed)
-----   Message from Jennefer Bravo sent at 03/25/2022  1:06 PM EDT ----- Regarding: RE: 7 DAY ZIO PER DR. Johney Frame done ----- Message ----- From: Nuala Alpha, LPN Sent: 7/89/7847  11:27 AM EDT To: Nuala Alpha, LPN; Shelly A Wells; # Subject: 7 DAY ZIO PER DR. Johney Frame                    Dr. Johney Frame ordered a 7 day zio for syncope  Please enroll and let me know when you do?  Thanks so much [employer]

## 2022-03-27 ENCOUNTER — Ambulatory Visit (HOSPITAL_BASED_OUTPATIENT_CLINIC_OR_DEPARTMENT_OTHER): Payer: Medicare Other | Admitting: Physical Therapy

## 2022-03-27 ENCOUNTER — Encounter (HOSPITAL_BASED_OUTPATIENT_CLINIC_OR_DEPARTMENT_OTHER): Payer: Self-pay | Admitting: Physical Therapy

## 2022-03-29 ENCOUNTER — Ambulatory Visit (HOSPITAL_BASED_OUTPATIENT_CLINIC_OR_DEPARTMENT_OTHER): Payer: Medicare Other | Admitting: Physical Therapy

## 2022-03-29 ENCOUNTER — Encounter (HOSPITAL_BASED_OUTPATIENT_CLINIC_OR_DEPARTMENT_OTHER): Payer: Self-pay | Admitting: Physical Therapy

## 2022-03-29 DIAGNOSIS — M6281 Muscle weakness (generalized): Secondary | ICD-10-CM | POA: Diagnosis not present

## 2022-03-29 DIAGNOSIS — R278 Other lack of coordination: Secondary | ICD-10-CM

## 2022-03-29 NOTE — Therapy (Signed)
OUTPATIENT PHYSICAL THERAPY TREATMENT NOTE     Patient Name: Dana Finley MRN: [id number] DOB:08-Dec-1955, 66 y.o., female Today's Date: 03/29/2022    END OF SESSION:   PT End of Session - 03/29/22 0936     Visit Number 13    Number of Visits 18    Date for PT Re-Evaluation 04/15/22    Authorization Type MCR A and B    PT Start Time 0933    PT Stop Time 1015    PT Time Calculation (min) 42 min    Activity Tolerance Patient tolerated treatment well    Behavior During Therapy WFL for tasks assessed/performed                Past Medical History:  Diagnosis Date   ADD (attention deficit disorder)    Anxiety    Depression    Diverticula of colon    Hemorrhoids    History of colonic polyps hyperplastic   Hypertension    Hypothyroidism    IBS (irritable bowel syndrome)    Insomnia, persistent    Migraine headache without aura    lumbosacral disc desease   Osteopenia    Serrated adenoma of colon 06/2012   Weight loss    Past Surgical History:  Procedure Laterality Date   ABDOMINAL HYSTERECTOMY     1996   APPENDECTOMY     CATARACT EXTRACTION     bilateral   COLONOSCOPY     EYE SURGERY     muscle surgery; has had approx 18 surgeries on both eyes   POLYPECTOMY     Patient Active Problem List   Diagnosis Date Noted   Wellness examination 02/26/2022   Osteopenia 02/26/2022   Leg weakness, bilateral 01/09/2022   Fall 12/18/2021   Skin lesion 11/26/2021   Pain of left hip joint 09/05/2021   Chronic kidney disease 08/27/2021   Hypothyroidism 08/27/2021   Bilateral high frequency sensorineural hearing loss 07/05/2019   Hallux rigidus of right foot 03/24/2018   Acute upper respiratory infection 05/31/2016   Multinodular goiter 02/27/2016   Family history of colon cancer 08/31/2014   Chalazion of right lower eyelid 10/08/2012   Meibomian gland disease 10/08/2012   Hashimoto's thyroiditis 07/10/2011   RHINITIS 12/27/2009   NUMBNESS 12/27/2009   PERIPHERAL  NEUROPATHY, LOWER EXTREMITY, 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   ACHILLES TENDINITIS 12/19/2009   HERNIATED LUMBOSACRAL DISC 12/07/2009   [place] DISEASE, LUMBOSACRAL SPINE 12/07/2009   ESSENTIAL HYPERTENSION, BENIGN 10/08/2007   IRRITABLE BOWEL SYNDROME 09/09/2007   DIARRHEA 09/09/2007   WEIGHT LOSS 08/20/2007   ANXIETY DEPRESSION 08/12/2007   INSOMNIA, CHRONIC 06/30/2007   OSTEOPENIA 06/30/2007   ADD (attention deficit disorder) 12/19/2006   Migraines 12/19/2006   COLONIC POLYPS, HYPERPLASTIC 11/19/2005   Colon polyps 11/19/2005    PCP: de [place], Raymond J, MD    REFERRING PROVIDER: [place], Raymond J, MD     REFERRING DIAG: (ICD-10-CM) - Leg weakness, bilateral   THERAPY DIAG:  Muscle weakness (generalized)   Other lack of coordination   Rationale for Evaluation and Treatment Rehabilitation   ONSET DATE: MD visit/order 01/09/2022 ; onset 1.5 years ago   SUBJECTIVE:    SUBJECTIVE STATEMENT: Pt reports compliance with HEP.  Pt denies any adverse effects after prior Rx.  Pt feels she is getting better and reports improved balance.  Pt performed an aquatic class yesterday and had no adverse effects.  Pt cancelled her Wed PT appt due to having a bad day.  Pt saw the cardiologist who thinks she has orthostatic hypotension.  Cardiologist ordered an echocardiogram and heart monitor.  Pt plans to put on the heart monitor this afternoon.     PERTINENT HISTORY: Pt states she has arthritis in back and a Hx of bilat hip bursitis. Dizziness, Stage 3 kidney disease, Migraines, and osteopenia R great toe surgery approx 4-6 years ago.      PAIN:  Are you having pain? No   PRECAUTIONS: Other: Hx of back pain and hip bursitis   WEIGHT BEARING RESTRICTIONS No   FALLS:  Has patient fallen in last 6 months? Yes. Number of falls 3   LIVING ENVIRONMENT: Lives with: lives with their spouse Lives in: 2 story home Stairs: yes Has following equipment at home: Environmental consultant - 2 wheeled     PLOF:  Independent   PATIENT GOALS improve strength in her legs and improve balance     OBJECTIVE:    DIAGNOSTIC FINDINGS: N/A     TODAY'S TREATMENT:   Therapeutic Exercise: Pt performed:  Nu-step bilat UE/LE's at L3 x 5 mins  Cybex leg press x20 and x10 reps at 30#  Mini squats 2x10 without UE support S/L clams x10 AROM and x 10 reps with RTB bilat each bilat S/L hip abduction 2x10 reps bilat            Heel raises on airex 2x10 reps  Neuro Re-ed Activities: Marching on airex 2x10 with occasional support for LOB  SLS with light UE support 3x20 sec bilat Ambulating over 4 hurdles with reciprocal gait and up/down a 6 inch step with SBA with rail at side Sidestepping over 4 hurdles with SBA and with lateral step up/down on 6 inch step Grapevines with frequent UE support with CGA/SBA     PATIENT EDUCATION:  Education details:  POC, dx, rationale of exercises, and exercise form. Person educated: Patient and Spouse Education method: Explanation, demonstration, verbal cues Education comprehension: verbalized understanding, returned demonstration, verbal cues required, and needs further education     HOME EXERCISE PROGRAM: Access Code: [email or web address] URL: [email or web address] Date: 02/15/2022 Prepared by: Ronny Flurry  Exercises - Supine Active Straight Leg Raise  - 1 x daily - 5-6 x weekly - 2 sets - 10 reps - Supine Bridge  - 1 x daily - 5-6 x weekly - 2 sets - 10 reps - Hooklying Clamshell with Resistance  - 1 x daily - 4 x weekly - 2 sets - 10 reps - Standing Heel Raise with Support  - 1 x daily - 5-6 x weekly - 2 sets - 10 reps - Romberg Stance  - 1 x daily - 7 x weekly - 3 reps - 30 seconds hold -Staggered stance 1x daily -6-7x weekly - 2 reps - 20 second hold  ASSESSMENT:   CLINICAL IMPRESSION: Pt presents to Rx stating she is feeling better.  She reports improved balance.  Pt demonstrates improved balance in the clinic as evidenced by performance of SLS and  walking over hurdles.  Pt had good control and stability with walking over hurdles and on/off step.  PT added grapevines and Pt did have balance deficits.  Pt had some instability with grapevines and required frequent use of rail for assistance.  Pt performed exercises well and gives great effort with all exercises.  Pt able to perform squats without UE support.  She demonstrated good form with minimal cuing for squats.  Pt had good tolerance to exercises today and had no c/o's after Rx.  Pt should benefit from cont skilled PT services to address ongoing goals, improve strength, and to assist in restoring desired level of function.       OBJECTIVE IMPAIRMENTS Abnormal gait, decreased activity tolerance, decreased balance, decreased coordination, decreased endurance, decreased mobility, difficulty walking, and decreased strength.    ACTIVITY LIMITATIONS squatting, stairs, and transfers   PARTICIPATION LIMITATIONS: community activity and walking the dog   PERSONAL FACTORS 3+ comorbidities: arthritis in back, hip bursitis, R great toe surgery  are also affecting patient's functional outcome.    REHAB POTENTIAL: Good   CLINICAL DECISION MAKING: Evolving/moderate complexity   EVALUATION COMPLEXITY: Moderate     GOALS:     SHORT TERM GOALS: Target date:  03/06/2022 Pt will be independent and compliant with HEP for improved strength, balance, and functional mobility. Baseline: Goal status: GOAL MET   2.  Pt will perform 5x STS test in no > 17 sec for improved functional LE strength and performance of sit/stand transfers. Baseline: GOAL MET   3.  Pt will report at least a 25% improvement in her balance and stability overall.  Baseline:  Goal status: GOAL MET       LONG TERM GOALS: Target date: 04/26/2022    Pt will be able to perform sit/stand transfers including toilet transfers without difficulty.  Baseline:  Goal status: ONGOING   2.  Pt will demo improved bilat hip strength to at  least 4+/5 and bilat knee strength to 5/5 MMT for improved performance of functional mobility.   Baseline:  Goal status: ONGOING   3.  Pt will perform 5x STS test in no > 12 seconds for improved functional LE strength.  Baseline:  Goal status: PROGRESSING   4.  Pt will be able to perform tandem stance for 20 sec without UE assist independently for improve balance and proprioception. Baseline:  Goal status: NOT ASSESSED   5.  Pt will demo improved quality of gait including increased gait speed.  Baseline:  Goal status: PROGRESSING   6.  Pt will report at least a 60-70% improvement in her balance and stability with daily mobility.  Baseline:  Goal status: PARTIALLY MET     PLAN: PT FREQUENCY: 2x/week   PT DURATION: 4 weeks   PLANNED INTERVENTIONS: Therapeutic exercises, Therapeutic activity, Neuromuscular re-education, Balance training, Gait training, Patient/Family education, Stair training, Aquatic Therapy, Electrical stimulation, Cryotherapy, Moist heat, Manual therapy, and Re-evaluation   PLAN FOR NEXT SESSION: continue with LE strengthening and balance training.  Pt may also benefit from aquatic therapy.  Pt is seeing the neurologist in September.      Selinda Michaels III PT, DPT 03/29/22 2:35 PM

## 2022-03-30 DIAGNOSIS — R55 Syncope and collapse: Secondary | ICD-10-CM

## 2022-04-01 ENCOUNTER — Encounter (HOSPITAL_BASED_OUTPATIENT_CLINIC_OR_DEPARTMENT_OTHER): Payer: Self-pay | Admitting: Physical Therapy

## 2022-04-01 ENCOUNTER — Ambulatory Visit (HOSPITAL_BASED_OUTPATIENT_CLINIC_OR_DEPARTMENT_OTHER): Payer: Medicare Other | Admitting: Physical Therapy

## 2022-04-01 DIAGNOSIS — M6281 Muscle weakness (generalized): Secondary | ICD-10-CM

## 2022-04-01 DIAGNOSIS — R278 Other lack of coordination: Secondary | ICD-10-CM

## 2022-04-01 NOTE — Therapy (Signed)
OUTPATIENT PHYSICAL THERAPY TREATMENT NOTE     Patient Name: CORAH WILLEFORD MRN: [id number] DOB:Oct 10, 1955, 66 y.o., female Today's Date: 04/02/2022    END OF SESSION:   PT End of Session - 04/01/22 1206     Visit Number 14    Number of Visits 18    Date for PT Re-Evaluation 04/15/22    Authorization Type MCR A and B    PT Start Time 3295    PT Stop Time 1232    PT Time Calculation (min) 38 min    Activity Tolerance Patient tolerated treatment well    Behavior During Therapy WFL for tasks assessed/performed                Past Medical History:  Diagnosis Date   ADD (attention deficit disorder)    Anxiety    Depression    Diverticula of colon    Hemorrhoids    History of colonic polyps hyperplastic   Hypertension    Hypothyroidism    IBS (irritable bowel syndrome)    Insomnia, persistent    Migraine headache without aura    lumbosacral disc desease   Osteopenia    Serrated adenoma of colon 06/2012   Weight loss    Past Surgical History:  Procedure Laterality Date   ABDOMINAL HYSTERECTOMY     1996   APPENDECTOMY     CATARACT EXTRACTION     bilateral   COLONOSCOPY     EYE SURGERY     muscle surgery; has had approx 18 surgeries on both eyes   POLYPECTOMY     Patient Active Problem List   Diagnosis Date Noted   Wellness examination 02/26/2022   Osteopenia 02/26/2022   Leg weakness, bilateral 01/09/2022   Fall 12/18/2021   Skin lesion 11/26/2021   Pain of left hip joint 09/05/2021   Chronic kidney disease 08/27/2021   Hypothyroidism 08/27/2021   Bilateral high frequency sensorineural hearing loss 07/05/2019   Hallux rigidus of right foot 03/24/2018   Acute upper respiratory infection 05/31/2016   Multinodular goiter 02/27/2016   Family history of colon cancer 08/31/2014   Chalazion of right lower eyelid 10/08/2012   Meibomian gland disease 10/08/2012   Hashimoto's thyroiditis 07/10/2011   RHINITIS 12/27/2009   NUMBNESS 12/27/2009   PERIPHERAL  NEUROPATHY, LOWER EXTREMITY, 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   ACHILLES TENDINITIS 12/19/2009   HERNIATED LUMBOSACRAL DISC 12/07/2009   [place] DISEASE, LUMBOSACRAL SPINE 12/07/2009   ESSENTIAL HYPERTENSION, BENIGN 10/08/2007   IRRITABLE BOWEL SYNDROME 09/09/2007   DIARRHEA 09/09/2007   WEIGHT LOSS 08/20/2007   ANXIETY DEPRESSION 08/12/2007   INSOMNIA, CHRONIC 06/30/2007   OSTEOPENIA 06/30/2007   ADD (attention deficit disorder) 12/19/2006   Migraines 12/19/2006   COLONIC POLYPS, HYPERPLASTIC 11/19/2005   Colon polyps 11/19/2005    PCP: [place], Raymond J, MD    REFERRING PROVIDER: [place], Raymond J, MD     REFERRING DIAG: (ICD-10-CM) - Leg weakness, bilateral   THERAPY DIAG:  Muscle weakness (generalized)   Other lack of coordination   Rationale for Evaluation and Treatment Rehabilitation   ONSET DATE: MD visit/order 01/09/2022 ; onset 1.5 years ago   SUBJECTIVE:    SUBJECTIVE STATEMENT: Pt reports compliance with HEP.  Pt denies any adverse effects after prior Rx.  Pt feels she is getting better and reports improved balance.   Pt saw the cardiologist who thinks she has orthostatic hypotension.  Cardiologist ordered an echocardiogram and heart monitor.  Pt is wearing the  heart monitor currently and  is unable to take aquatic classes this week.     PERTINENT HISTORY: Pt states she has arthritis in back and a Hx of bilat hip bursitis. Dizziness, Stage 3 kidney disease, Migraines, and osteopenia R great toe surgery approx 4-6 years ago.      PAIN:  Are you having pain? No   PRECAUTIONS: Other: Hx of back pain and hip bursitis   WEIGHT BEARING RESTRICTIONS No   FALLS:  Has patient fallen in last 6 months? Yes. Number of falls 3   LIVING ENVIRONMENT: Lives with: lives with their spouse Lives in: 2 story home Stairs: yes Has following equipment at home: Environmental consultant - 2 wheeled     PLOF: Independent   PATIENT GOALS improve strength in her legs and improve balance     OBJECTIVE:     DIAGNOSTIC FINDINGS: N/A     TODAY'S TREATMENT:   Therapeutic Exercise: Pt performed:  Nu-step bilat UE/LE's at L3 x 5 mins  Cybex leg press 3x10 reps at 35#  Mini squats 2x10 without UE support S/L clams 2 x 10 reps with RTB bilat each bilat S/L hip abduction 2x10 reps bilat  Supine bridge 2x10           Heel raises on airex 2x10 reps  Neuro Re-ed Activities: Marching on airex 2x10 with occasional support for LOB  SLS with and without light UE support 3x20 sec bilat Ambulating over 4 hurdles with reciprocal gait and up/down a 6 inch step with SBA with rail at side x 10 reps Sidestepping over 4 hurdles with SBA and with lateral step up/down on 6 inch step x 10 reps Grapevines with frequent UE support with CGA/SBA     PATIENT EDUCATION:  Education details:  POC, dx, rationale of exercises, and exercise form. Person educated: Patient and Spouse Education method: Explanation, demonstration, verbal cues Education comprehension: verbalized understanding, returned demonstration, verbal cues required, and needs further education     HOME EXERCISE PROGRAM: Access Code: [email or web address] URL: [email or web address] Date: 02/15/2022 Prepared by: Ronny Flurry  Exercises - Supine Active Straight Leg Raise  - 1 x daily - 5-6 x weekly - 2 sets - 10 reps - Supine Bridge  - 1 x daily - 5-6 x weekly - 2 sets - 10 reps - Hooklying Clamshell with Resistance  - 1 x daily - 4 x weekly - 2 sets - 10 reps - Standing Heel Raise with Support  - 1 x daily - 5-6 x weekly - 2 sets - 10 reps - Romberg Stance  - 1 x daily - 7 x weekly - 3 reps - 30 seconds hold -Staggered stance 1x daily -6-7x weekly - 2 reps - 20 second hold  ASSESSMENT:   CLINICAL IMPRESSION: Pt is improving with strength and tolerance to exercises as evidenced by progression of exercises in the clinic.  She gives great effort with all exercises and performed exercises well.  She demonstrates improved stability.  Pt  able to step over hurdles and onto 6 inch step well without UE support.  Pt able to self correct her LOB with stepping over hurdles.  Pt continues to have difficulty with SLS though was able to stand longer without UE assistance today.  Pt still had some LOB with grapevines though did demonstrate improved stability.  Pt responded well to Rx having no c/o's after Rx.  Pt should benefit from cont skilled PT services to address ongoing goals, improve strength, and to assist in restoring desired level of function.  OBJECTIVE IMPAIRMENTS Abnormal gait, decreased activity tolerance, decreased balance, decreased coordination, decreased endurance, decreased mobility, difficulty walking, and decreased strength.    ACTIVITY LIMITATIONS squatting, stairs, and transfers   PARTICIPATION LIMITATIONS: community activity and walking the dog   PERSONAL FACTORS 3+ comorbidities: arthritis in back, hip bursitis, R great toe surgery  are also affecting patient's functional outcome.    REHAB POTENTIAL: Good   CLINICAL DECISION MAKING: Evolving/moderate complexity   EVALUATION COMPLEXITY: Moderate     GOALS:     SHORT TERM GOALS: Target date:  03/06/2022 Pt will be independent and compliant with HEP for improved strength, balance, and functional mobility. Baseline: Goal status: GOAL MET   2.  Pt will perform 5x STS test in no > 17 sec for improved functional LE strength and performance of sit/stand transfers. Baseline: GOAL MET   3.  Pt will report at least a 25% improvement in her balance and stability overall.  Baseline:  Goal status: GOAL MET       LONG TERM GOALS: Target date: 04/30/2022    Pt will be able to perform sit/stand transfers including toilet transfers without difficulty.  Baseline:  Goal status: ONGOING   2.  Pt will demo improved bilat hip strength to at least 4+/5 and bilat knee strength to 5/5 MMT for improved performance of functional mobility.   Baseline:  Goal status:  ONGOING   3.  Pt will perform 5x STS test in no > 12 seconds for improved functional LE strength.  Baseline:  Goal status: PROGRESSING   4.  Pt will be able to perform tandem stance for 20 sec without UE assist independently for improve balance and proprioception. Baseline:  Goal status: NOT ASSESSED   5.  Pt will demo improved quality of gait including increased gait speed.  Baseline:  Goal status: PROGRESSING   6.  Pt will report at least a 60-70% improvement in her balance and stability with daily mobility.  Baseline:  Goal status: PARTIALLY MET     PLAN: PT FREQUENCY: 2x/week   PT DURATION: 4 weeks   PLANNED INTERVENTIONS: Therapeutic exercises, Therapeutic activity, Neuromuscular re-education, Balance training, Gait training, Patient/Family education, Stair training, Aquatic Therapy, Electrical stimulation, Cryotherapy, Moist heat, Manual therapy, and Re-evaluation   PLAN FOR NEXT SESSION: continue with LE strengthening and balance training.  Pt may also benefit from aquatic therapy.  Pt is seeing the neurologist in September.      Selinda Michaels III PT, DPT 04/02/22 1:15 PM

## 2022-04-03 ENCOUNTER — Ambulatory Visit (HOSPITAL_BASED_OUTPATIENT_CLINIC_OR_DEPARTMENT_OTHER): Payer: Medicare Other | Admitting: Physical Therapy

## 2022-04-03 ENCOUNTER — Encounter (HOSPITAL_BASED_OUTPATIENT_CLINIC_OR_DEPARTMENT_OTHER): Payer: Self-pay | Admitting: Physical Therapy

## 2022-04-03 DIAGNOSIS — R278 Other lack of coordination: Secondary | ICD-10-CM

## 2022-04-03 DIAGNOSIS — M6281 Muscle weakness (generalized): Secondary | ICD-10-CM

## 2022-04-03 NOTE — Therapy (Signed)
OUTPATIENT PHYSICAL THERAPY TREATMENT NOTE     Patient Name: Dana Finley MRN: [id number] DOB:[birth year], 66 y.o., female Today's Date: 04/04/2022    END OF SESSION:   PT End of Session - 04/03/22 1315     Visit Number 15    Number of Visits 18    Date for PT Re-Evaluation 04/15/22    Authorization Type MCR A and B    PT Start Time 1306    PT Stop Time 5852    PT Time Calculation (min) 41 min    Activity Tolerance Patient tolerated treatment well    Behavior During Therapy WFL for tasks assessed/performed                Past Medical History:  Diagnosis Date   ADD (attention deficit disorder)    Anxiety    Depression    Diverticula of colon    Hemorrhoids    History of colonic polyps hyperplastic   Hypertension    Hypothyroidism    IBS (irritable bowel syndrome)    Insomnia, persistent    Migraine headache without aura    lumbosacral disc desease   Osteopenia    Serrated adenoma of colon 06/2012   Weight loss    Past Surgical History:  Procedure Laterality Date   ABDOMINAL HYSTERECTOMY     1996   APPENDECTOMY     CATARACT EXTRACTION     bilateral   COLONOSCOPY     EYE SURGERY     muscle surgery; has had approx 18 surgeries on both eyes   POLYPECTOMY     Patient Active Problem List   Diagnosis Date Noted   Wellness examination 02/26/2022   Osteopenia 02/26/2022   Leg weakness, bilateral 01/09/2022   Fall 12/18/2021   Skin lesion 11/26/2021   Pain of left hip joint 09/05/2021   Chronic kidney disease 08/27/2021   Hypothyroidism 08/27/2021   Bilateral high frequency sensorineural hearing loss 07/05/2019   Hallux rigidus of right foot 03/24/2018   Acute upper respiratory infection 05/31/2016   Multinodular goiter 02/27/2016   Family history of colon cancer 08/31/2014   Chalazion of right lower eyelid 10/08/2012   Meibomian gland disease 10/08/2012   Hashimoto's thyroiditis 07/10/2011   RHINITIS 12/27/2009   NUMBNESS 12/27/2009   PERIPHERAL  NEUROPATHY, LOWER EXTREMITY, 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   ACHILLES TENDINITIS 12/19/2009   HERNIATED LUMBOSACRAL DISC 12/07/2009   [place] DISEASE, LUMBOSACRAL SPINE 12/07/2009   ESSENTIAL HYPERTENSION, BENIGN 10/08/2007   IRRITABLE BOWEL SYNDROME 09/09/2007   DIARRHEA 09/09/2007   WEIGHT LOSS 08/20/2007   ANXIETY DEPRESSION 08/12/2007   INSOMNIA, CHRONIC 06/30/2007   OSTEOPENIA 06/30/2007   ADD (attention deficit disorder) 12/19/2006   Migraines 12/19/2006   COLONIC POLYPS, HYPERPLASTIC 11/19/2005   Colon polyps 11/19/2005    PCP: de [place], Raymond J, MD    REFERRING PROVIDER: [place], Raymond J, MD     REFERRING DIAG: (ICD-10-CM) - Leg weakness, bilateral   THERAPY DIAG:  Muscle weakness (generalized)   Other lack of coordination   Rationale for Evaluation and Treatment Rehabilitation   ONSET DATE: MD visit/order 01/09/2022 ; onset 1.5 years ago   SUBJECTIVE:    SUBJECTIVE STATEMENT: Pt reports compliance with HEP.  Pt denies any adverse effects after prior Rx.  Pt states she still has heaviness in her legs including performing stairs.  Pt states the heaviness in her legs has not changed.  Pt reports improved balance.  Pt is wearing the heart monitor currently and is unable to  take aquatic classes this week.       PERTINENT HISTORY: Pt states she has arthritis in back and a Hx of bilat hip bursitis. Dizziness, Stage 3 kidney disease, Migraines, and osteopenia R great toe surgery approx 4-6 years ago.      PAIN:  Are you having pain? No   PRECAUTIONS: Other: Hx of back pain and hip bursitis   WEIGHT BEARING RESTRICTIONS No   FALLS:  Has patient fallen in last 6 months? Yes. Number of falls 3   LIVING ENVIRONMENT: Lives with: lives with their spouse Lives in: 2 story home Stairs: yes Has following equipment at home: Environmental consultant - 2 wheeled     PLOF: Independent   PATIENT GOALS improve strength in her legs and improve balance     OBJECTIVE:    DIAGNOSTIC FINDINGS: N/A      TODAY'S TREATMENT:   Therapeutic Exercise: Pt performed:  Nu-step bilat UE/LE's at L3 x 5 mins  Cybex leg press x10 reps at 35#, 2x10 at 40#  Mini squats 2x10 without UE support  LAQ 2x10 with 5# bilat  Seated HS curl RTB 2x10 bilat S/L clams 2 x 10 reps with RTB bilat each bilat S/L hip abduction 2x10 reps bilat  Supine bridge 2x10           Heel raises on airex 2x10 reps  Neuro Re-ed Activities: Marching on airex 2x10 with occasional support for LOB  SLS with and without light UE support 3x20 sec bilat Ambulating over 4 hurdles with reciprocal gait and up/down a 6 inch step with SBA with rail at side x 5-6 reps Sidestepping over 4 hurdles with SBA and with lateral step up/down on 6 inch step x 5-6 reps Grapevines without UE support with SBA     PATIENT EDUCATION:  Education details:  POC, dx, rationale of exercises, and exercise form. Person educated: Patient and Spouse Education method: Explanation, demonstration, verbal cues Education comprehension: verbalized understanding, returned demonstration, verbal cues required, and needs further education     HOME EXERCISE PROGRAM: Access Code: [email or web address] URL: [email or web address] Date: 02/15/2022 Prepared by: Ronny Flurry  Exercises - Supine Active Straight Leg Raise  - 1 x daily - 5-6 x weekly - 2 sets - 10 reps - Supine Bridge  - 1 x daily - 5-6 x weekly - 2 sets - 10 reps - Hooklying Clamshell with Resistance  - 1 x daily - 4 x weekly - 2 sets - 10 reps - Standing Heel Raise with Support  - 1 x daily - 5-6 x weekly - 2 sets - 10 reps - Romberg Stance  - 1 x daily - 7 x weekly - 3 reps - 30 seconds hold -Staggered stance 1x daily -6-7x weekly - 2 reps - 20 second hold  ASSESSMENT:   CLINICAL IMPRESSION: Pt is improving with strength and tolerance to exercises as evidenced by progression of exercises in the clinic.  She gives great effort with all exercises and performed exercises well.  Pt reports  improved balance.  Pt able to step over hurdles and onto 6 inch step well without UE support.  Pt continues to have difficulty with SLS though is improving.  Pt demonstrates much improved stability with grapevines not using rail for support and having improved balance.  Pt responded well to Rx having no c/o's after Rx.  Pt should benefit from cont skilled PT services to address ongoing goals, improve strength, and to assist in restoring desired level of function.  OBJECTIVE IMPAIRMENTS Abnormal gait, decreased activity tolerance, decreased balance, decreased coordination, decreased endurance, decreased mobility, difficulty walking, and decreased strength.    ACTIVITY LIMITATIONS squatting, stairs, and transfers   PARTICIPATION LIMITATIONS: community activity and walking the dog   PERSONAL FACTORS 3+ comorbidities: arthritis in back, hip bursitis, R great toe surgery  are also affecting patient's functional outcome.    REHAB POTENTIAL: Good   CLINICAL DECISION MAKING: Evolving/moderate complexity   EVALUATION COMPLEXITY: Moderate     GOALS:     SHORT TERM GOALS: Target date:  03/06/2022 Pt will be independent and compliant with HEP for improved strength, balance, and functional mobility. Baseline: Goal status: GOAL MET   2.  Pt will perform 5x STS test in no > 17 sec for improved functional LE strength and performance of sit/stand transfers. Baseline: GOAL MET   3.  Pt will report at least a 25% improvement in her balance and stability overall.  Baseline:  Goal status: GOAL MET       LONG TERM GOALS: Target date: 05/02/2022    Pt will be able to perform sit/stand transfers including toilet transfers without difficulty.  Baseline:  Goal status: ONGOING   2.  Pt will demo improved bilat hip strength to at least 4+/5 and bilat knee strength to 5/5 MMT for improved performance of functional mobility.   Baseline:  Goal status: ONGOING   3.  Pt will perform 5x STS test in no  > 12 seconds for improved functional LE strength.  Baseline:  Goal status: PROGRESSING   4.  Pt will be able to perform tandem stance for 20 sec without UE assist independently for improve balance and proprioception. Baseline:  Goal status: NOT ASSESSED   5.  Pt will demo improved quality of gait including increased gait speed.  Baseline:  Goal status: PROGRESSING   6.  Pt will report at least a 60-70% improvement in her balance and stability with daily mobility.  Baseline:  Goal status: PARTIALLY MET     PLAN: PT FREQUENCY: 2x/week   PT DURATION: 4 weeks   PLANNED INTERVENTIONS: Therapeutic exercises, Therapeutic activity, Neuromuscular re-education, Balance training, Gait training, Patient/Family education, Stair training, Aquatic Therapy, Electrical stimulation, Cryotherapy, Moist heat, Manual therapy, and Re-evaluation   PLAN FOR NEXT SESSION: continue with LE strengthening and balance training.  Pt may also benefit from aquatic therapy.  Pt is seeing the neurologist in September.      Selinda Michaels III PT, DPT 04/04/22 4:22 PM

## 2022-04-04 ENCOUNTER — Encounter (HOSPITAL_COMMUNITY): Payer: Self-pay | Admitting: *Deleted

## 2022-04-04 ENCOUNTER — Telehealth (HOSPITAL_COMMUNITY): Payer: Self-pay | Admitting: *Deleted

## 2022-04-04 NOTE — Telephone Encounter (Signed)
Left message on voicemail in reference to upcoming appointment scheduled for  04/09/22 Phone number given for a call back so details instructions can be given.  Letter sent with instructions to my chart.  Dana Finley

## 2022-04-08 ENCOUNTER — Ambulatory Visit (HOSPITAL_BASED_OUTPATIENT_CLINIC_OR_DEPARTMENT_OTHER): Payer: Medicare Other | Admitting: Physical Therapy

## 2022-04-09 ENCOUNTER — Ambulatory Visit (HOSPITAL_COMMUNITY): Payer: Medicare Other

## 2022-04-09 ENCOUNTER — Other Ambulatory Visit: Payer: PRIVATE HEALTH INSURANCE

## 2022-04-10 ENCOUNTER — Ambulatory Visit (HOSPITAL_BASED_OUTPATIENT_CLINIC_OR_DEPARTMENT_OTHER): Payer: Medicare Other | Admitting: Physical Therapy

## 2022-04-12 ENCOUNTER — Encounter: Payer: Self-pay | Admitting: Cardiology

## 2022-04-16 ENCOUNTER — Encounter (HOSPITAL_BASED_OUTPATIENT_CLINIC_OR_DEPARTMENT_OTHER): Payer: Self-pay | Admitting: Physical Therapy

## 2022-04-16 ENCOUNTER — Ambulatory Visit (HOSPITAL_BASED_OUTPATIENT_CLINIC_OR_DEPARTMENT_OTHER): Payer: Medicare Other | Attending: Family Medicine | Admitting: Physical Therapy

## 2022-04-16 DIAGNOSIS — R278 Other lack of coordination: Secondary | ICD-10-CM | POA: Diagnosis present

## 2022-04-16 DIAGNOSIS — M6281 Muscle weakness (generalized): Secondary | ICD-10-CM | POA: Diagnosis present

## 2022-04-16 NOTE — Therapy (Signed)
OUTPATIENT PHYSICAL THERAPY TREATMENT NOTE     Patient Name: Dana Finley MRN: [id number] DOB:March 24, 1956, 66 y.o., female Today's Date: 04/16/2022    END OF SESSION:   PT End of Session - 04/16/22 1019     Visit Number 16    Number of Visits 18    Date for PT Re-Evaluation 04/15/22    Authorization Type MCR A and B    PT Start Time 1017    PT Stop Time 1059    PT Time Calculation (min) 42 min    Activity Tolerance Patient tolerated treatment well    Behavior During Therapy WFL for tasks assessed/performed                Past Medical History:  Diagnosis Date   ADD (attention deficit disorder)    Anxiety    Depression    Diverticula of colon    Hemorrhoids    History of colonic polyps hyperplastic   Hypertension    Hypothyroidism    IBS (irritable bowel syndrome)    Insomnia, persistent    Migraine headache without aura    lumbosacral disc desease   Osteopenia    Serrated adenoma of colon 06/2012   Weight loss    Past Surgical History:  Procedure Laterality Date   ABDOMINAL HYSTERECTOMY     1996   APPENDECTOMY     CATARACT EXTRACTION     bilateral   COLONOSCOPY     EYE SURGERY     muscle surgery; has had approx 18 surgeries on both eyes   POLYPECTOMY     Patient Active Problem List   Diagnosis Date Noted   Wellness examination 02/26/2022   Osteopenia 02/26/2022   Leg weakness, bilateral 01/09/2022   Fall 12/18/2021   Skin lesion 11/26/2021   Pain of left hip joint 09/05/2021   Chronic kidney disease 08/27/2021   Hypothyroidism 08/27/2021   Bilateral high frequency sensorineural hearing loss 07/05/2019   Hallux rigidus of right foot 03/24/2018   Acute upper respiratory infection 05/31/2016   Multinodular goiter 02/27/2016   Family history of colon cancer 08/31/2014   Chalazion of right lower eyelid 10/08/2012   Meibomian gland disease 10/08/2012   Hashimoto's thyroiditis 07/10/2011   RHINITIS 12/27/2009   NUMBNESS 12/27/2009   PERIPHERAL  NEUROPATHY, LOWER EXTREMITY, 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   ACHILLES TENDINITIS 12/19/2009   HERNIATED LUMBOSACRAL DISC 12/07/2009   [place] DISEASE, LUMBOSACRAL SPINE 12/07/2009   ESSENTIAL HYPERTENSION, BENIGN 10/08/2007   IRRITABLE BOWEL SYNDROME 09/09/2007   DIARRHEA 09/09/2007   WEIGHT LOSS 08/20/2007   ANXIETY DEPRESSION 08/12/2007   INSOMNIA, CHRONIC 06/30/2007   OSTEOPENIA 06/30/2007   ADD (attention deficit disorder) 12/19/2006   Migraines 12/19/2006   COLONIC POLYPS, HYPERPLASTIC 11/19/2005   Colon polyps 11/19/2005    PCP: [place], Raymond J, MD    REFERRING PROVIDER: [place], Raymond J, MD     REFERRING DIAG: (ICD-10-CM) - Leg weakness, bilateral   THERAPY DIAG:  Muscle weakness (generalized)   Other lack of coordination   Rationale for Evaluation and Treatment Rehabilitation   ONSET DATE: MD visit/order 01/09/2022 ; onset 1.5 years ago   SUBJECTIVE:    SUBJECTIVE STATEMENT: Heart monitor turned out to be fine. Possible postural hypotension. Heaviness is unchanged.     PERTINENT HISTORY: Pt states she has arthritis in back and a Hx of bilat hip bursitis. Dizziness, Stage 3 kidney disease, Migraines, and osteopenia R great toe surgery approx 4-6 years ago.      PAIN:  Are you having pain? No   PRECAUTIONS: Other: Hx of back pain and hip bursitis   WEIGHT BEARING RESTRICTIONS No   FALLS:  Has patient fallen in last 6 months? Yes. Number of falls 3   LIVING ENVIRONMENT: Lives with: lives with their spouse Lives in: 2 story home Stairs: yes Has following equipment at home: Environmental consultant - 2 wheeled     PLOF: Independent   PATIENT GOALS improve strength in her legs and improve balance     OBJECTIVE:    DIAGNOSTIC FINDINGS: N/A     TODAY'S TREATMENT:  Lateral ecc step downs Heel raises on step Narrow squat/reach for chair outside BOS; progressed to one foot propped on toes with reach to back of chair Fwd & lateral step/reach and return Wall sit with ball  squeezes     PATIENT EDUCATION:  Education details:  POC, dx, rationale of exercises, and exercise form. Person educated: Patient and Spouse Education method: Explanation, demonstration, verbal cues Education comprehension: verbalized understanding, returned demonstration, verbal cues required, and needs further education     HOME EXERCISE PROGRAM: Access Code: [email or web address] URL: [email or web address]   ASSESSMENT:   CLINICAL IMPRESSION: Significantly less strength in left thigh vs Rt in strength exercises today.     OBJECTIVE IMPAIRMENTS Abnormal gait, decreased activity tolerance, decreased balance, decreased coordination, decreased endurance, decreased mobility, difficulty walking, and decreased strength.    ACTIVITY LIMITATIONS squatting, stairs, and transfers   PARTICIPATION LIMITATIONS: community activity and walking the dog   PERSONAL FACTORS 3+ comorbidities: arthritis in back, hip bursitis, R great toe surgery  are also affecting patient's functional outcome.    REHAB POTENTIAL: Good   CLINICAL DECISION MAKING: Evolving/moderate complexity   EVALUATION COMPLEXITY: Moderate     GOALS:     SHORT TERM GOALS: Target date:  03/06/2022 Pt will be independent and compliant with HEP for improved strength, balance, and functional mobility. Baseline: Goal status: GOAL MET   2.  Pt will perform 5x STS test in no > 17 sec for improved functional LE strength and performance of sit/stand transfers. Baseline: GOAL MET   3.  Pt will report at least a 25% improvement in her balance and stability overall.  Baseline:  Goal status: GOAL MET       LONG TERM GOALS: Target date: 05/14/2022    Pt will be able to perform sit/stand transfers including toilet transfers without difficulty.  Baseline:  Goal status: ONGOING   2.  Pt will demo improved bilat hip strength to at least 4+/5 and bilat knee strength to 5/5 MMT for improved performance of functional mobility.    Baseline:  Goal status: ONGOING   3.  Pt will perform 5x STS test in no > 12 seconds for improved functional LE strength.  Baseline:  Goal status: PROGRESSING   4.  Pt will be able to perform tandem stance for 20 sec without UE assist independently for improve balance and proprioception. Baseline:  Goal status: NOT ASSESSED   5.  Pt will demo improved quality of gait including increased gait speed.  Baseline:  Goal status: PROGRESSING   6.  Pt will report at least a 60-70% improvement in her balance and stability with daily mobility.  Baseline:  Goal status: PARTIALLY MET     PLAN: PT FREQUENCY: 2x/week   PT DURATION: 4 weeks   PLANNED INTERVENTIONS: Therapeutic exercises, Therapeutic activity, Neuromuscular re-education, Balance training, Gait training, Patient/Family education, Stair training, Aquatic Therapy, Electrical stimulation, Cryotherapy, Moist heat, Manual therapy, and  Re-evaluation   PLAN FOR NEXT SESSION: nearing end of POC, progress quad strength/endurance challenges, add head turns to balance, leg press with icnreased weight     Armida Vickroy C. Zira Helinski PT, DPT 04/16/22 11:00 AM

## 2022-04-18 ENCOUNTER — Encounter (HOSPITAL_BASED_OUTPATIENT_CLINIC_OR_DEPARTMENT_OTHER): Payer: PRIVATE HEALTH INSURANCE | Admitting: Physical Therapy

## 2022-04-21 NOTE — Therapy (Signed)
OUTPATIENT PHYSICAL THERAPY TREATMENT NOTE / PROGRESS NOTE  Progress Note Reporting Period 03/22/2022 to 04/22/2022  See note below for Objective Data and Assessment of Progress/Goals.        Patient Name: Dana Finley MRN: [id number] DOB:Mar 19, 1956, 66 y.o., female Today's Date: 04/21/2022    END OF SESSION:        Past Medical History:  Diagnosis Date   ADD (attention deficit disorder)    Anxiety    Depression    Diverticula of colon    Hemorrhoids    History of colonic polyps hyperplastic   Hypertension    Hypothyroidism    IBS (irritable bowel syndrome)    Insomnia, persistent    Migraine headache without aura    lumbosacral disc desease   Osteopenia    Serrated adenoma of colon 06/2012   Weight loss    Past Surgical History:  Procedure Laterality Date   ABDOMINAL HYSTERECTOMY     1996   APPENDECTOMY     CATARACT EXTRACTION     bilateral   COLONOSCOPY     EYE SURGERY     muscle surgery; has had approx 18 surgeries on both eyes   POLYPECTOMY     Patient Active Problem List   Diagnosis Date Noted   Wellness examination 02/26/2022   Osteopenia 02/26/2022   Leg weakness, bilateral 01/09/2022   Fall 12/18/2021   Skin lesion 11/26/2021   Pain of left hip joint 09/05/2021   Chronic kidney disease 08/27/2021   Hypothyroidism 08/27/2021   Bilateral high frequency sensorineural hearing loss 07/05/2019   Hallux rigidus of right foot 03/24/2018   Acute upper respiratory infection 05/31/2016   Multinodular goiter 02/27/2016   Family history of colon cancer 08/31/2014   Chalazion of right lower eyelid 10/08/2012   Meibomian gland disease 10/08/2012   Hashimoto's thyroiditis 07/10/2011   RHINITIS 12/27/2009   NUMBNESS 12/27/2009   PERIPHERAL NEUROPATHY, LOWER EXTREMITY, 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   ACHILLES TENDINITIS 12/19/2009   HERNIATED LUMBOSACRAL DISC 12/07/2009   [place] DISEASE, LUMBOSACRAL SPINE 12/07/2009   ESSENTIAL HYPERTENSION, BENIGN 10/08/2007    IRRITABLE BOWEL SYNDROME 09/09/2007   DIARRHEA 09/09/2007   WEIGHT LOSS 08/20/2007   ANXIETY DEPRESSION 08/12/2007   INSOMNIA, CHRONIC 06/30/2007   OSTEOPENIA 06/30/2007   ADD (attention deficit disorder) 12/19/2006   Migraines 12/19/2006   COLONIC POLYPS, HYPERPLASTIC 11/19/2005   Colon polyps 11/19/2005    PCP: de [place], Raymond J, MD    REFERRING PROVIDER: de [place], Raymond J, MD     REFERRING DIAG: (ICD-10-CM) - Leg weakness, bilateral   THERAPY DIAG:  Muscle weakness (generalized)   Other lack of coordination   Rationale for Evaluation and Treatment Rehabilitation   ONSET DATE: MD visit/order 01/09/2022 ; onset 1.5 years ago   SUBJECTIVE:    SUBJECTIVE STATEMENT: Pt reports compliance with HEP.  Pt denies any adverse effects after prior Rx.  Pt states she still has heaviness in her legs including performing stairs.  Pt states the heaviness in her legs has not changed.  Pt reports improved balance.  Pt is wearing the heart monitor currently and is unable to take aquatic classes this week.   FUNCTIONAL IMPROVEMENTS: FUNCTIONAL LIMITATIONS:      PERTINENT HISTORY: Pt states she has arthritis in back and a Hx of bilat hip bursitis. Dizziness, Stage 3 kidney disease, Migraines, and osteopenia R great toe surgery approx 4-6 years ago.      PAIN:  Are you having pain? No   PRECAUTIONS: Other: Hx  of back pain and hip bursitis   WEIGHT BEARING RESTRICTIONS No   FALLS:  Has patient fallen in last 6 months? Yes. Number of falls 3   LIVING ENVIRONMENT: Lives with: lives with their spouse Lives in: 2 story home Stairs: yes Has following equipment at home: Environmental consultant - 2 wheeled     PLOF: Independent   PATIENT GOALS improve strength in her legs and improve balance     OBJECTIVE:    DIAGNOSTIC FINDINGS: N/A     TODAY'S TREATMENT: PHYSICAL PERFORMANCE TESTING:   PATIENT SURVEYS:  LEFS:  Current / Initial:  41/80 / 27/80     5 times sit to stand without UE's:  17 sec / 27 sec  Initial/Current   LOWER EXTREMITY MMT:   MMT Right eval Left eval Right 03/18/2022 Left 03/18/2022  Hip flexion 4/5 4-/5 4+/5 4+/5  Hip extension <3/5 <3/5 3/5 <3/5  Hip abduction 3+/5 3+/5 4-/5 3+/5  Hip adduction          Hip internal rotation          Hip external rotation 4-/5 4-/5 3+/5 3+/5  Knee flexion Had HS cramping 4/5 4-/5 4-/5  Knee extension 4/5 4-/5 4/5 4-/5  Ankle dorsiflexion 4/5 4/5 4+/5 4-/5  Ankle plantarflexion Weak, tested in sitting  Weak, tested in sitting  Improved tolerance with manual resistance Weak, tested in sitting  Ankle inversion          Ankle eversion           (Blank rows = not tested)   GAIT: Assistive device utilized: None Comments: improved gait speed.  Pt had no hesitancy after standing up from chair and initiating her walking.    BALANCE: Tandem Stance: SLS:   Therapeutic Exercise: Pt performed:  Nu-step bilat UE/LE's at L3 x 5 mins  Cybex leg press x10 reps at 35#, 2x10 at 40#  Mini squats 2x10 without UE support  LAQ 2x10 with 5# bilat  Seated HS curl RTB 2x10 bilat S/L clams 2 x 10 reps with RTB bilat each bilat S/L hip abduction 2x10 reps bilat  Supine bridge 2x10           Heel raises on airex 2x10 reps  Neuro Re-ed Activities: Marching on airex 2x10 with occasional support for LOB  SLS with and without light UE support 3x20 sec bilat Ambulating over 4 hurdles with reciprocal gait and up/down a 6 inch step with SBA with rail at side x 5-6 reps Sidestepping over 4 hurdles with SBA and with lateral step up/down on 6 inch step x 5-6 reps Grapevines without UE support with SBA     PATIENT EDUCATION:  Education details:  POC, dx, rationale of exercises, and exercise form. Person educated: Patient and Spouse Education method: Explanation, demonstration, verbal cues Education comprehension: verbalized understanding, returned demonstration, verbal cues required, and needs further education     HOME EXERCISE  PROGRAM: Access Code: [email or web address] URL: [email or web address] Date: 02/15/2022 Prepared by: Ronny Flurry  Exercises - Supine Active Straight Leg Raise  - 1 x daily - 5-6 x weekly - 2 sets - 10 reps - Supine Bridge  - 1 x daily - 5-6 x weekly - 2 sets - 10 reps - Hooklying Clamshell with Resistance  - 1 x daily - 4 x weekly - 2 sets - 10 reps - Standing Heel Raise with Support  - 1 x daily - 5-6 x weekly - 2 sets - 10 reps - Romberg Stance  -  1 x daily - 7 x weekly - 3 reps - 30 seconds hold -Staggered stance 1x daily -6-7x weekly - 2 reps - 20 second hold  ASSESSMENT:   CLINICAL IMPRESSION: Pt is improving with strength and tolerance to exercises as evidenced by progression of exercises in the clinic.  She gives great effort with all exercises and performed exercises well.  Pt reports improved balance.  Pt able to step over hurdles and onto 6 inch step well without UE support.  Pt continues to have difficulty with SLS though is improving.  Pt demonstrates much improved stability with grapevines not using rail for support and having improved balance.  Pt responded well to Rx having no c/o's after Rx.  Pt should benefit from cont skilled PT services to address ongoing goals, improve strength, and to assist in restoring desired level of function.      OBJECTIVE IMPAIRMENTS Abnormal gait, decreased activity tolerance, decreased balance, decreased coordination, decreased endurance, decreased mobility, difficulty walking, and decreased strength.    ACTIVITY LIMITATIONS squatting, stairs, and transfers   PARTICIPATION LIMITATIONS: community activity and walking the dog   PERSONAL FACTORS 3+ comorbidities: arthritis in back, hip bursitis, R great toe surgery  are also affecting patient's functional outcome.    REHAB POTENTIAL: Good   CLINICAL DECISION MAKING: Evolving/moderate complexity   EVALUATION COMPLEXITY: Moderate     GOALS:     SHORT TERM GOALS: Target date:   03/06/2022 Pt will be independent and compliant with HEP for improved strength, balance, and functional mobility. Baseline: Goal status: GOAL MET   2.  Pt will perform 5x STS test in no > 17 sec for improved functional LE strength and performance of sit/stand transfers. Baseline: GOAL MET   3.  Pt will report at least a 25% improvement in her balance and stability overall.  Baseline:  Goal status: GOAL MET       LONG TERM GOALS: Target date: 05/19/2022    Pt will be able to perform sit/stand transfers including toilet transfers without difficulty.  Baseline:  Goal status: ONGOING   2.  Pt will demo improved bilat hip strength to at least 4+/5 and bilat knee strength to 5/5 MMT for improved performance of functional mobility.   Baseline:  Goal status: ONGOING   3.  Pt will perform 5x STS test in no > 12 seconds for improved functional LE strength.  Baseline:  Goal status: PROGRESSING   4.  Pt will be able to perform tandem stance for 20 sec without UE assist independently for improve balance and proprioception. Baseline:  Goal status: NOT ASSESSED   5.  Pt will demo improved quality of gait including increased gait speed.  Baseline:  Goal status: PROGRESSING   6.  Pt will report at least a 60-70% improvement in her balance and stability with daily mobility.  Baseline:  Goal status: PARTIALLY MET     PLAN: PT FREQUENCY: 2x/week   PT DURATION: 4 weeks   PLANNED INTERVENTIONS: Therapeutic exercises, Therapeutic activity, Neuromuscular re-education, Balance training, Gait training, Patient/Family education, Stair training, Aquatic Therapy, Electrical stimulation, Cryotherapy, Moist heat, Manual therapy, and Re-evaluation   PLAN FOR NEXT SESSION: continue with LE strengthening and balance training.  Pt may also benefit from aquatic therapy.  Pt is seeing the neurologist in September.      Selinda Michaels III PT, DPT 04/21/22 7:17 AM

## 2022-04-22 ENCOUNTER — Telehealth (HOSPITAL_COMMUNITY): Payer: Self-pay | Admitting: Radiology

## 2022-04-22 ENCOUNTER — Ambulatory Visit (HOSPITAL_BASED_OUTPATIENT_CLINIC_OR_DEPARTMENT_OTHER): Payer: Medicare Other | Admitting: Physical Therapy

## 2022-04-22 ENCOUNTER — Encounter (HOSPITAL_BASED_OUTPATIENT_CLINIC_OR_DEPARTMENT_OTHER): Payer: Self-pay | Admitting: Physical Therapy

## 2022-04-22 DIAGNOSIS — M6281 Muscle weakness (generalized): Secondary | ICD-10-CM | POA: Diagnosis not present

## 2022-04-22 DIAGNOSIS — R278 Other lack of coordination: Secondary | ICD-10-CM

## 2022-04-22 NOTE — Telephone Encounter (Signed)
Left message on voicemail per DPR in reference to upcoming appointment scheduled on 9/12 at 8:15 with detailed instructions given per Stress Test Requisition Sheet for the test. LM to arrive 30 minutes early, and that it is imperative to arrive on time for appointment to keep from having the test rescheduled. If you need to cancel or reschedule your appointment, please call the office within 24 hours of your appointment. Failure to do so may result in a cancellation of your appointment, and a $50 no show fee. Phone number given for call back for any questions. EHK

## 2022-04-23 ENCOUNTER — Ambulatory Visit (HOSPITAL_COMMUNITY): Payer: Medicare Other | Attending: Cardiology

## 2022-04-23 ENCOUNTER — Ambulatory Visit (HOSPITAL_BASED_OUTPATIENT_CLINIC_OR_DEPARTMENT_OTHER): Payer: Medicare Other

## 2022-04-23 DIAGNOSIS — I10 Essential (primary) hypertension: Secondary | ICD-10-CM | POA: Diagnosis not present

## 2022-04-23 DIAGNOSIS — R55 Syncope and collapse: Secondary | ICD-10-CM

## 2022-04-23 DIAGNOSIS — R0602 Shortness of breath: Secondary | ICD-10-CM

## 2022-04-23 LAB — MYOCARDIAL PERFUSION IMAGING
LV dias vol: 68 mL (ref 46–106)
LV sys vol: 23 mL
Nuc Stress EF: 66 %
Peak HR: 103 {beats}/min
Rest HR: 65 {beats}/min
Rest Nuclear Isotope Dose: 9.9 mCi
SDS: 0
SRS: 0
SSS: 0
ST Depression (mm): 0 mm
Stress Nuclear Isotope Dose: 30.9 mCi
TID: 0.83

## 2022-04-23 LAB — ECHOCARDIOGRAM COMPLETE
Area-P 1/2: 2.65 cm2
Height: 67 in
S' Lateral: 3.3 cm
Weight: 2608 oz

## 2022-04-23 MED ORDER — TECHNETIUM TC 99M TETROFOSMIN IV KIT
9.9000 | PACK | Freq: Once | INTRAVENOUS | Status: AC | PRN
  Administered 2022-04-23: 9.9 via INTRAVENOUS

## 2022-04-23 MED ORDER — REGADENOSON 0.4 MG/5ML IV SOLN
0.4000 mg | Freq: Once | INTRAVENOUS | Status: AC
  Administered 2022-04-23: 0.4 mg via INTRAVENOUS

## 2022-04-23 MED ORDER — TECHNETIUM TC 99M TETROFOSMIN IV KIT
30.9000 | PACK | Freq: Once | INTRAVENOUS | Status: AC | PRN
  Administered 2022-04-23: 30.9 via INTRAVENOUS

## 2022-04-24 ENCOUNTER — Telehealth: Payer: Self-pay | Admitting: Cardiology

## 2022-04-24 MED ORDER — METOPROLOL TARTRATE 25 MG PO TABS: 12.5000 mg | ORAL_TABLET | Freq: Two times a day (BID) | ORAL | 2 refills | Status: DC

## 2022-04-24 NOTE — Telephone Encounter (Signed)
Pt returning nurses call regarding test results. Call transferred

## 2022-04-24 NOTE — Telephone Encounter (Signed)
Freada Bergeron, MD  04/24/2022  8:37 AM EDT     Her stress test is normal! This is great news!    Freada Bergeron, MD  04/24/2022  8:36 AM EDT     Her echo looks great with normal pumping function and no significant valve disease.   The patient has been notified of the result and verbalized understanding.  All questions (if any) were answered. Nuala Alpha, LPN 1/61/0960 4:54 AM    Pt and spouse both reported that the pt will be seeing Neurology in Oct, and they will keep [place] posted on what the Neurologist says, and determine if any additional cardiac work-up will be needed at that time.  This is for syncope.   Pt states she does want the metoprolol 12.5 mg po bid to be sent to her pharmacy of choice, as Dr. Johney Frame advised on her previous heart monitor results.   Metoprolol tartrate 12.5 mg po bid sent to the pts pharmacy.  Pt education provided on what to do for orthostasis.    Both parties verbalized understanding and agrees with this plan.   Off note:  below is monitor results that reference for pt to start low dose beta blocker based on symptoms.  Nuala Alpha, LPN  0/04/8118  1:47 PM EDT     The patient has been notified of the result and verbalized understanding.  All questions (if any) were answered.   Pt prefers holding off on starting metoprolol at this time, until all testing is complete. She states she feels palpitations at times, but would prefer to have her echo and stress test complete on 9/12, before starting this med.     Endorsed to the pt that would be fine and I will make Dr. Johney Frame aware of this.  Advised her to proceed with her current treatment plan and scheduled appts/test. Pt verbalized understanding and agrees with this plan.   Freada Bergeron, MD  04/17/2022  4:41 PM EDT     Her heart monitor looks good which shows no significant arrhythmias or ectopy. She does have extra beats which is normal but she feels these extra beats as they  frequently correlated with her symptoms. These are not harmful to her but if they are bothersome, we could start metop 12.'5mg'$  BID to help suppress them.

## 2022-04-25 ENCOUNTER — Other Ambulatory Visit: Payer: PRIVATE HEALTH INSURANCE

## 2022-04-26 ENCOUNTER — Encounter (HOSPITAL_BASED_OUTPATIENT_CLINIC_OR_DEPARTMENT_OTHER): Payer: PRIVATE HEALTH INSURANCE | Admitting: Physical Therapy

## 2022-05-01 ENCOUNTER — Encounter (HOSPITAL_BASED_OUTPATIENT_CLINIC_OR_DEPARTMENT_OTHER): Payer: Self-pay | Admitting: Physical Therapy

## 2022-05-02 ENCOUNTER — Encounter (HOSPITAL_BASED_OUTPATIENT_CLINIC_OR_DEPARTMENT_OTHER): Payer: Self-pay | Admitting: Physical Therapy

## 2022-05-03 ENCOUNTER — Encounter (HOSPITAL_BASED_OUTPATIENT_CLINIC_OR_DEPARTMENT_OTHER): Payer: Self-pay | Admitting: Physical Therapy

## 2022-05-03 ENCOUNTER — Other Ambulatory Visit (HOSPITAL_BASED_OUTPATIENT_CLINIC_OR_DEPARTMENT_OTHER): Payer: Self-pay

## 2022-05-09 ENCOUNTER — Other Ambulatory Visit: Payer: PRIVATE HEALTH INSURANCE

## 2022-05-21 ENCOUNTER — Ambulatory Visit
Admission: RE | Admit: 2022-05-21 | Discharge: 2022-05-21 | Disposition: A | Discharge status: Home / Self Care | Payer: Medicare Other | Source: Ambulatory Visit | Attending: Family Medicine | Admitting: Family Medicine

## 2022-05-21 DIAGNOSIS — M8588 Other specified disorders of bone density and structure, other site: Secondary | ICD-10-CM

## 2022-05-21 DIAGNOSIS — M8580 Other specified disorders of bone density and structure, unspecified site: Secondary | ICD-10-CM

## 2022-05-25 ENCOUNTER — Other Ambulatory Visit (HOSPITAL_BASED_OUTPATIENT_CLINIC_OR_DEPARTMENT_OTHER): Payer: Self-pay | Admitting: Family Medicine

## 2022-05-25 DIAGNOSIS — I10 Essential (primary) hypertension: Secondary | ICD-10-CM

## 2022-05-29 ENCOUNTER — Encounter (HOSPITAL_BASED_OUTPATIENT_CLINIC_OR_DEPARTMENT_OTHER): Payer: Self-pay | Admitting: Nurse Practitioner

## 2022-05-29 ENCOUNTER — Other Ambulatory Visit (HOSPITAL_BASED_OUTPATIENT_CLINIC_OR_DEPARTMENT_OTHER): Payer: Self-pay

## 2022-05-29 ENCOUNTER — Ambulatory Visit (HOSPITAL_BASED_OUTPATIENT_CLINIC_OR_DEPARTMENT_OTHER): Payer: Medicare Other | Admitting: Family Medicine

## 2022-05-29 ENCOUNTER — Ambulatory Visit (INDEPENDENT_AMBULATORY_CARE_PROVIDER_SITE_OTHER): Payer: Medicare Other | Admitting: Nurse Practitioner

## 2022-05-29 VITALS — BP 113/57 | HR 63 | Ht 67.0 in | Wt 163.2 lb

## 2022-05-29 DIAGNOSIS — I10 Essential (primary) hypertension: Secondary | ICD-10-CM | POA: Diagnosis not present

## 2022-05-29 DIAGNOSIS — R29898 Other symptoms and signs involving the musculoskeletal system: Secondary | ICD-10-CM

## 2022-05-29 DIAGNOSIS — M629 Disorder of muscle, unspecified: Secondary | ICD-10-CM

## 2022-05-29 DIAGNOSIS — N1831 Chronic kidney disease, stage 3a: Secondary | ICD-10-CM | POA: Diagnosis not present

## 2022-05-29 DIAGNOSIS — R55 Syncope and collapse: Secondary | ICD-10-CM | POA: Diagnosis not present

## 2022-05-29 MED ORDER — LISINOPRIL 10 MG PO TABS: 10.0000 mg | ORAL_TABLET | Freq: Every day | ORAL | 1 refills | Status: DC

## 2022-05-29 MED ORDER — HYDROCHLOROTHIAZIDE 12.5 MG PO CAPS: 12.5000 mg | ORAL_CAPSULE | Freq: Every day | ORAL | 1 refills | Status: DC

## 2022-05-29 NOTE — Patient Instructions (Addendum)
It was a pleasure to meet you today!  Let's try the individual HCTZ at 12.'5mg'$  and Lisinopril '10mg'$  once a day and monitor blood pressures to make sure they do not drop too low.  If the blood pressures drop less than 110/60 on a regular basis, then try cutting the lisinopril into 1/2 ('5mg'$ ) and continue with the full dose of hydrochlorothiazide and see if that will give you better results.   I recommend calcium '1000mg'$  daily to help with osteopenia. If your vitamin D levels are normal, I recommend 1000iU of Vitamin D3 daily, but let's see what your vitamin D levels show and we can see if there are higher levels needed.   If the vitamin D is normal or close to normal, let's plan to see vein and vascular.   Vaughan Basta,  I want to thank you for trusting me with your primary care services for nearly the last two years at [facility]. It is been an Surveyor, minerals to serve so many wonderful people in the community in a new and innovative facility in [place]. I was recently presented with a new opportunity that will allow me to continue to serve as a primary care provider within [facility] at a different location. After much contemplation, I have made the decision to make this transition. I never dreamed of leaving [facility] and will leave a piece of my heart in this amazing facility. The new office provides an opportunity to continue to provide the level of care my patients have always experienced with increased support staff and improved work-life balance for myself.    Dr. Burnard Bunting will be taking over patients who would like to stay at [facility] and he will be happy to continue your care. I know many of you utilize numerous services in the building and the convenience of the location has been a large draw to the facility, therefore staying in one location offers a great benefit to you. If you plan to stay here, the office staff will be happy to help you with this transition when you schedule a follow-up or your next  appointment. They are actively searching for a replacement for me and will hopefully have a new provider in place by the first of the year.  Of course, I will be thrilled to continue to provide care for any patients wishing to follow me. I will be accepting current and new patients at the new location.   Starting July 01, 2022 I will be seeing patients at [facility] located at [address] [place], [place] [address].  This practice is very near [facility] and conveniently located less than 15 minutes from my current location and less than 10 minutes off of the [address] from several exits including; [place], [place], [place].  My new practice is already working to begin scheduling for me with follow-ups, annual visits, new patient appointments, and routine care. If you would like to be placed on the schedule at the new location, please feel free to call [phone] and they will be happy to get you on the schedule at your convenience.   I look forward to continue to serve my community and hopefully see many familiar faces I love. If you are unable to transition, I completely understand. Thank you all for your understanding, transition is never easy, but I am hopeful you will find the transition to be positive for your care.   Sincerely,  [name], DNP, AGNP-c

## 2022-05-29 NOTE — Progress Notes (Addendum)
Dana Keeler, DNP, AGNP-c Dana Finley, Dana Finley [id number] [phone] Office [phone] Fax  ESTABLISHED PATIENT- Chronic Health and/or Follow-Up Visit  Blood pressure (!) 113/57, pulse 63, height '5\' 7"'$  (1.702 m), weight 163 lb 3.2 oz (74 kg), SpO2 97 %.    Dana Finley is a 66 y.o. year old female presenting today for evaluation and management of the following: Follow-up (Patient presents today for follow up blood pressure. )  [name] initially established care with Dr. Burnard Bunting and she wishes to transfer her care to my services today. Both Dr. Burnard Bunting and myself are in agreement to this decision.  1. Essential hypertension, benign [name] endorses her blood pressure has been asymptomatic since her last visit.  She denies headaches, chest pains, palpitations, dizziness, and vision changes.  She is taking her medication as prescribed without missed doses.  She denies any side effects to her current medications.  2. Leg heaviness She endorses bilateral leg heaviness described as "cinderblocks on her thighs" of unknown etiology.  She tells me this has been going on for about the past 3 months.  She has not had any significant changes in activity levels, diet, or medications since the onset.  She denies significant changes in temperature, coloration, or swelling in LE.   3. Vasovagal syncope She endorses a history of vasovagal symptoms related to an accelerated heart rate.  She has been on metoprolol 12.5 mg twice a day for management of this and feels that this has been significantly effective in keeping her heart rate under control.  She denies syncopal episodes in the recent past.   All ROS negative with exception of what is listed above.   PHYSICAL EXAM Physical Exam Vitals and nursing note reviewed.  Constitutional:      Appearance: Normal appearance.  HENT:     Head: Normocephalic.  Eyes:     Extraocular  Movements: Extraocular movements intact.     Pupils: Pupils are equal, round, and reactive to light.  Neck:     Vascular: No carotid bruit.  Cardiovascular:     Rate and Rhythm: Normal rate and regular rhythm.     Pulses: Normal pulses.     Heart sounds: Normal heart sounds.  Pulmonary:     Effort: Pulmonary effort is normal.     Breath sounds: Normal breath sounds.  Abdominal:     General: Bowel sounds are normal. There is no distension.     Tenderness: There is no abdominal tenderness.  Musculoskeletal:     Cervical back: Normal range of motion.  Lymphadenopathy:     Cervical: No cervical adenopathy.  Skin:    General: Skin is warm and dry.     Capillary Refill: Capillary refill takes less than 2 seconds.  Neurological:     General: No focal deficit present.     Mental Status: She is alert and oriented to person, place, and time.  Psychiatric:        Mood and Affect: Mood normal.        Behavior: Behavior normal.        Thought Content: Thought content normal.     PLAN Problem List Items Addressed This Visit     ESSENTIAL HYPERTENSION, BENIGN    Chronic.  Blood pressure on the lower end of normal at this time.  Previous episodes of syncope have resolved with addition of metoprolol and improved control of accelerated heart rate.  I am concerned  that the addition of metoprolol may have driven her blood pressure down slightly.  In an effort to avoid repeat syncopal episodes I do feel that alteration in her blood pressure medication is warranted at this time.  We will plan to continue blood pressure medication with lisinopril 10 mg and individual dose of hydrochlorothiazide 12.5 mg daily and monitor blood pressure very closely.  If blood pressure remains stable on these doses we will add these medications as the combination pill.  If blood pressure needs additional control we can consider increasing the hydrochlorothiazide back to 25 mg or slightly increasing the lisinopril dosage.   Recommend continuation of evaluation and monitoring with cardiology.  We will continue to collaborate care with this specialty.  No alarm symptoms present at this time. Labs pending.  Continue to monitor blood pressure at home and report readings greater than 140/85 or less than 100/50.      Relevant Medications   lisinopril (ZESTRIL) 10 MG tablet   hydrochlorothiazide (MICROZIDE) 12.5 MG capsule   Chronic kidney disease    Chronic.  Blood pressure very well controlled at this time.  No alarm symptoms present.  She is currently following with nephrology and monitoring for nephrotoxic medications.  Recommend ensuring adequate hydration and notify immediately if she notices any decrease in urinary output or new urinary symptoms.  We will monitor labs today.      Relevant Orders   VITAMIN D 25 Hydroxy (Vit-D Deficiency, Fractures) (Completed)   Leg heaviness - Primary    Bilateral heaviness of unknown etiology.  Discussion with patient about possibility of evaluation with vein and vascular.  Today we will plan to monitor labs to ensure that there are no deficiencies present that could be contributing to her symptoms.  If no deficiencies are noted we will plan for vein and vascular referral as long as patient is in agreement with this plan.  We will follow-up based on labs.      Relevant Orders   Comprehensive metabolic panel (Completed)   B12 and Folate Panel (Completed)   VITAMIN D 25 Hydroxy (Vit-D Deficiency, Fractures) (Completed)   TSH (Completed)   Vasovagal syncope    History of vasovagal syncope related to accelerated heart rate.  This symptom has resolved with use of metoprolol.  Recommend continuation of medication on a daily basis and monitor closely for new signs or symptoms.  Neurologically and cardiovascularly stable on evaluation today.  We will continue to follow.      Other Visit Diagnoses     Disorder of muscle, unspecified       Relevant Orders   TSH (Completed)        Return for TBD- based on labs. Addendum: Labs show elevation in B12 levels with no current replacement taken. Review of records shows no previous measurement of this lab, therefore unclear if this is a new finding or something that has been present for a while. Given this finding with no clear etiology, I do recommend a referral to hematology for further evaluation and work-up. If patient is in agreement, will send referral. Will also plan to reach out to hematology to determine if they would like me to repeat the labs or obtain additional labs prior to seeing her.   Dana Keeler, DNP, AGNP-c 05/29/2022  9:45 AM

## 2022-05-30 LAB — COMPREHENSIVE METABOLIC PANEL
ALT: 7 IU/L (ref 0–32)
AST: 17 IU/L (ref 0–40)
Albumin/Globulin Ratio: 1.5 (ref 1.2–2.2)
Albumin: 4.1 g/dL (ref 3.9–4.9)
Alkaline Phosphatase: 131 IU/L — ABNORMAL HIGH (ref 44–121)
BUN/Creatinine Ratio: 13 (ref 12–28)
BUN: 13 mg/dL (ref 8–27)
Bilirubin Total: 0.2 mg/dL (ref 0.0–1.2)
CO2: 21 mmol/L (ref 20–29)
Calcium: 9.5 mg/dL (ref 8.7–10.3)
Chloride: 105 mmol/L (ref 96–106)
Creatinine, Ser: 1.01 mg/dL — ABNORMAL HIGH (ref 0.57–1.00)
Globulin, Total: 2.8 g/dL (ref 1.5–4.5)
Glucose: 85 mg/dL (ref 70–99)
Potassium: 4.4 mmol/L (ref 3.5–5.2)
Sodium: 141 mmol/L (ref 134–144)
Total Protein: 6.9 g/dL (ref 6.0–8.5)
eGFR: 61 mL/min/{1.73_m2} (ref >59)

## 2022-05-30 LAB — B12 AND FOLATE PANEL
Folate: 14.7 ng/mL (ref >3.0)
Vitamin B-12: >2000 pg/mL — ABNORMAL HIGH (ref 232–1245)

## 2022-05-30 LAB — TSH: TSH: 1.8 u[IU]/mL (ref 0.450–4.500)

## 2022-05-30 LAB — VITAMIN D 25 HYDROXY (VIT D DEFICIENCY, FRACTURES): Vit D, 25-Hydroxy: 54.4 ng/mL (ref 30.0–100.0)

## 2022-06-03 ENCOUNTER — Ambulatory Visit (HOSPITAL_COMMUNITY)
Admission: RE | Admit: 2022-06-03 | Discharge: 2022-06-03 | Disposition: A | Discharge status: Home / Self Care | Payer: Medicare Other | Source: Ambulatory Visit | Attending: Family Medicine | Admitting: Family Medicine

## 2022-06-03 DIAGNOSIS — R569 Unspecified convulsions: Secondary | ICD-10-CM | POA: Diagnosis present

## 2022-06-03 NOTE — Procedures (Signed)
Patient Name: JENNAVIE MARTINEK  MRN: [id number]  Epilepsy Attending: Lora Havens  Referring Physician/Provider: Melburn Popper, MD  Date: 06/03/2022 Duration: 22.07 mins  Patient history: 66yo G getting eeg to evaluate for seizure  Level of alertness: Awake  AEDs during EEG study: None  Technical aspects: This EEG study was done with scalp electrodes positioned according to the 10-20 International system of electrode placement. Electrical activity was reviewed with band pass filter of 1-'70Hz'$ , sensitivity of 7 uV/mm, display speed of 17m/sec with a '60Hz'$  notched filter applied as appropriate. EEG data were recorded continuously and digitally stored.  Video monitoring was available and reviewed as appropriate.  Description: The posterior dominant rhythm consists of 9 Hz activity of moderate voltage (25-35 uV) seen predominantly in posterior head regions, symmetric and reactive to eye opening and eye closing. There is also low amplitude 15 to 18 Hz beta activity distributed symmetrically and diffusely.  Hyperventilation did not show any EEG change. Physiologic photic driving was not seen during photic stimulation.    IMPRESSION: This study is within normal limits. No seizures or epileptiform discharges were seen throughout the recording.  A normal interictal EEG does not exclude  the diagnosis of epilepsy.   Aleighna Wojtas OBarbra Sarks

## 2022-06-03 NOTE — Progress Notes (Signed)
EEG complete - results pending 

## 2022-06-04 ENCOUNTER — Telehealth (HOSPITAL_BASED_OUTPATIENT_CLINIC_OR_DEPARTMENT_OTHER): Payer: Self-pay

## 2022-06-04 NOTE — Telephone Encounter (Signed)
Patient came by needing refills on :  Pravastatin '40mg'$  Sertraline '100mg'$  Trazadone '150mg'$  Adderall '30mg'$ .   Paper that patient left was placed on your desk.

## 2022-06-06 ENCOUNTER — Other Ambulatory Visit (HOSPITAL_BASED_OUTPATIENT_CLINIC_OR_DEPARTMENT_OTHER): Payer: Self-pay | Admitting: Nurse Practitioner

## 2022-06-06 DIAGNOSIS — E782 Mixed hyperlipidemia: Secondary | ICD-10-CM

## 2022-06-06 DIAGNOSIS — F909 Attention-deficit hyperactivity disorder, unspecified type: Secondary | ICD-10-CM

## 2022-06-06 DIAGNOSIS — I10 Essential (primary) hypertension: Secondary | ICD-10-CM

## 2022-06-06 DIAGNOSIS — G4700 Insomnia, unspecified: Secondary | ICD-10-CM

## 2022-06-06 DIAGNOSIS — F341 Dysthymic disorder: Secondary | ICD-10-CM

## 2022-06-06 MED ORDER — AMPHETAMINE-DEXTROAMPHETAMINE 30 MG PO TABS: 30.0000 mg | ORAL_TABLET | Freq: Two times a day (BID) | ORAL | 0 refills | Status: DC

## 2022-06-06 MED ORDER — SERTRALINE HCL 100 MG PO TABS: 150.0000 mg | ORAL_TABLET | Freq: Every day | ORAL | 3 refills | Status: DC

## 2022-06-06 MED ORDER — PRAVASTATIN SODIUM 40 MG PO TABS: 40.0000 mg | ORAL_TABLET | Freq: Every day | ORAL | 3 refills | Status: DC

## 2022-06-06 MED ORDER — TRAZODONE HCL 150 MG PO TABS: 150.0000 mg | ORAL_TABLET | Freq: Every evening | ORAL | 3 refills | Status: DC | PRN

## 2022-06-10 NOTE — Telephone Encounter (Signed)
Refills sent

## 2022-06-11 ENCOUNTER — Encounter (HOSPITAL_BASED_OUTPATIENT_CLINIC_OR_DEPARTMENT_OTHER): Payer: Self-pay | Admitting: Nurse Practitioner

## 2022-06-11 DIAGNOSIS — R55 Syncope and collapse: Secondary | ICD-10-CM | POA: Insufficient documentation

## 2022-06-11 NOTE — Assessment & Plan Note (Signed)
History of vasovagal syncope related to accelerated heart rate.  This symptom has resolved with use of metoprolol.  Recommend continuation of medication on a daily basis and monitor closely for new signs or symptoms.  Neurologically and cardiovascularly stable on evaluation today.  We will continue to follow.

## 2022-06-11 NOTE — Assessment & Plan Note (Addendum)
Chronic.  Blood pressure on the lower end of normal at this time.  Previous episodes of syncope have resolved with addition of metoprolol and improved control of accelerated heart rate.  I am concerned that the addition of metoprolol may have driven her blood pressure down slightly.  In an effort to avoid repeat syncopal episodes I do feel that alteration in her blood pressure medication is warranted at this time.  We will plan to continue blood pressure medication with lisinopril 10 mg and individual dose of hydrochlorothiazide 12.5 mg daily and monitor blood pressure very closely.  If blood pressure remains stable on these doses we will add these medications as the combination pill.  If blood pressure needs additional control we can consider increasing the hydrochlorothiazide back to 25 mg or slightly increasing the lisinopril dosage.  Recommend continuation of evaluation and monitoring with cardiology.  We will continue to collaborate care with this specialty.  No alarm symptoms present at this time. Labs pending.  Continue to monitor blood pressure at home and report readings greater than 140/85 or less than 100/50.

## 2022-06-11 NOTE — Assessment & Plan Note (Signed)
Bilateral heaviness of unknown etiology.  Discussion with patient about possibility of evaluation with vein and vascular.  Today we will plan to monitor labs to ensure that there are no deficiencies present that could be contributing to her symptoms.  If no deficiencies are noted we will plan for vein and vascular referral as long as patient is in agreement with this plan.  We will follow-up based on labs.

## 2022-06-11 NOTE — Assessment & Plan Note (Signed)
Chronic.  Blood pressure very well controlled at this time.  No alarm symptoms present.  She is currently following with nephrology and monitoring for nephrotoxic medications.  Recommend ensuring adequate hydration and notify immediately if she notices any decrease in urinary output or new urinary symptoms.  We will monitor labs today.

## 2022-06-11 NOTE — Assessment & Plan Note (Signed)
>>  ASSESSMENT AND PLAN FOR CHRONIC KIDNEY DISEASE WRITTEN ON 06/11/2022  9:06 PM BY Lenford Beddow E, NP  Chronic.  Blood pressure very well controlled at this time.  No alarm symptoms present.  She is currently following with nephrology and monitoring for nephrotoxic medications.  Recommend ensuring adequate hydration and notify immediately if she notices any decrease in urinary output or new urinary symptoms.  We will monitor labs today.

## 2022-06-14 ENCOUNTER — Encounter (HOSPITAL_BASED_OUTPATIENT_CLINIC_OR_DEPARTMENT_OTHER): Payer: Self-pay | Admitting: Nurse Practitioner

## 2022-06-14 DIAGNOSIS — I10 Essential (primary) hypertension: Secondary | ICD-10-CM

## 2022-06-18 MED ORDER — LISINOPRIL 10 MG PO TABS: 5.0000 mg | ORAL_TABLET | Freq: Every day | ORAL | 1 refills | Status: DC

## 2022-06-27 ENCOUNTER — Encounter: Payer: Self-pay | Admitting: Cardiology

## 2022-06-30 ENCOUNTER — Encounter: Payer: Self-pay | Admitting: Nurse Practitioner

## 2022-07-08 ENCOUNTER — Other Ambulatory Visit (HOSPITAL_BASED_OUTPATIENT_CLINIC_OR_DEPARTMENT_OTHER): Payer: Self-pay

## 2022-07-08 NOTE — Progress Notes (Unsigned)
Cardiology Office Note:    Date:  07/08/2022   ID:  Dana Finley, DOB [birth year], MRN [id number]  PCP:  No primary care provider on file.   [facility] Providers Cardiologist:  None {  Referring MD: No ref. provider found    History of Present Illness:    Dana Finley is a 66 y.o. female with a hx of anxiety, HTN, depression, and migraines who presents to clinic for follow-up.  Was seen by Dr. Hollie Salk in clinic on 02/27/22 where she was dizzy spells, lightheadedness and presyncope. Her HCTZ was stopped and she was referred to Cardiology for further work-up.  Was last seen in clinic on 03/2022 where she was having continued dizzy spells. Reported she has had several episodes of syncope over the past 83month. One episode was in 10/2021 when she was standing on a stool and suddenly lost consciousness and hit her head and required stitches. Went to ER at that time where work-up was unremarkable. Had another episode in 12/2021 where she stood to get out of the car after driving for several hours and feel to the floor.   Cardiac monitor 04/2022 with NSR with 12 runs of nonsustained SVT, rare SVE/VE. TTE 04/2022 with LVEF 50-55%, G1DD, normal RV, no significant valve disease. Myoview 04/2022 with normal perfusion. EF 66%.  Today, ***  Past Medical History:  Diagnosis Date   ACHILLES TENDINITIS 12/19/2009   Qualifier: Diagnosis of  By: SNiel HummerMD, WLorinda Creed   Acute upper respiratory infection 05/31/2016   ADD (attention deficit disorder)    Anxiety    Chalazion of right lower eyelid 10/08/2012   COLONIC POLYPS, HYPERPLASTIC 11/19/2005   Qualifier: Diagnosis of  By: MJerral Ralph    Depression    [place]EASE, LUMBOSACRAL SPINE 12/07/2009   Qualifier: Diagnosis of  By: SNiel HummerMD, WLorinda Creed   Diverticula of colon    Hemorrhoids    History of colonic polyps hyperplastic   Hypertension    Hypothyroidism    IBS (irritable bowel syndrome)    Insomnia, persistent     Migraine headache without aura    lumbosacral disc desease   Osteopenia    OSTEOPENIA 06/30/2007   Qualifier: Diagnosis of  By: SNiel HummerMD, WLorinda Creed   RHINITIS 12/27/2009   Qualifier: Diagnosis of  By: SNiel HummerMD, Willie R    Serrated adenoma of colon 06/2012   Skin lesion 11/26/2021   Weight loss    WEIGHT LOSS 08/20/2007   Qualifier: Diagnosis of  By: SNiel HummerMD, WLorinda Creed   Wellness examination 02/26/2022    Past Surgical History:  Procedure Laterality Date   ABDOMINAL HYSTERECTOMY     1996   APPENDECTOMY     CATARACT EXTRACTION     bilateral   COLONOSCOPY     EYE SURGERY     muscle surgery; has had approx 18 surgeries on both eyes   POLYPECTOMY      Current Medications: No outpatient medications have been marked as taking for the 07/09/22 encounter (Appointment) with PFreada Bergeron MD.     Allergies:   Codeine, Lisinopril, and Morphine and related   Social History   Socioeconomic History   Marital status: Married    Spouse name: Not on file   Number of children: 2   Years of education: Not on file   Highest education level: Not on file  Occupational History   Occupation: home  Employer: UNEMPLOYED  Tobacco Use   Smoking status: Former    Types: Cigarettes    Quit date: 06/12/1977    Years since quitting: 45.1   Smokeless tobacco: Never  Substance and Sexual Activity   Alcohol use: Yes    Comment: occasional   Drug use: No   Sexual activity: Not on file  Other Topics Concern   Not on file  Social History Narrative   Not on file   Social Determinants of Health   Financial Resource Strain: Not on file  Food Insecurity: Not on file  Transportation Needs: Not on file  Physical Activity: Not on file  Stress: Not on file  Social Connections: Not on file     Family History: The patient's family history includes Colon cancer (age of onset: 66) in her cousin; Colon cancer (age of onset: 53) in her maternal uncle and sister; Colon  polyps in her son; Diabetes in her mother; Early death in her sister; Heart disease in her mother; Leukemia in her brother; Pancreatic cancer (age of onset: 80) in her father; Stomach cancer (age of onset: 29) in her maternal uncle; Stroke in her mother. There is no history of Rectal cancer.  ROS:   Please see the history of present illness.     All other systems reviewed and are negative.  EKGs/Labs/Other Studies Reviewed:    The following studies were reviewed today: Myoview 04/2022:   The study is normal. The study is low risk.   No ST deviation was noted.   LV perfusion is abnormal. There is a small defect with moderate reduction in uptake present in the apex location(s) that is fixed. The defect appears worse at rest than with stress and there is normal wall motion in the defect area. Consistent with artifact caused by subdiaphragmatic activity.   Left ventricular function is normal. Nuclear stress EF: 66 %. The left ventricular ejection fraction is hyperdynamic (>65%). End diastolic cavity size is normal.   Prior study not available for comparison.  TTE 04/2022: IMPRESSIONS     1. Left ventricular ejection fraction, by estimation, is 50 to 55%. Left  ventricular ejection fraction by 3D volume is 54 %. The left ventricle has  low normal function. The left ventricle has no regional wall motion  abnormalities. Left ventricular  diastolic parameters are consistent with Grade I diastolic dysfunction  (impaired relaxation).   2. Right ventricular systolic function is normal. The right ventricular  size is normal.   3. The mitral valve is normal in structure. No evidence of mitral valve  regurgitation. No evidence of mitral stenosis.   4. The aortic valve is tricuspid. Aortic valve regurgitation is not  visualized. No aortic stenosis is present.   5. The inferior vena cava is normal in size with greater than 50%  respiratory variability, suggesting right atrial pressure of 3 mmHg.    Cardiac Monitor 04/2022:   Patch wear time was 6 days and 22 hours   Predominant rhythm was NSR with average HR 69bpm   There were 12 runs of nonsustained SVT with longest lasting 13 beats   Rare SVE (<1%), rare VE (<1%)   Patient triggered events correlate with NSR, sinus tach, SVE and PVCs   No sustained arrhythmias or significant pauses     Patch Wear Time:  6 days and 22 hours (2023-08-19T13:36:04-399 to 2023-08-26T11:57:11-398)   Patient had a min HR of 46 bpm, max HR of 174 bpm, and avg HR of 69 bpm. Predominant  underlying rhythm was Sinus Rhythm. 12 Supraventricular Tachycardia runs occurred, the run with the fastest interval lasting 12 beats with a max rate of 174 bpm, the  longest lasting 13 beats with an avg rate of 133 bpm. Isolated SVEs were rare (<1.0%), SVE Couplets were rare (<1.0%), and SVE Triplets were rare (<1.0%). Isolated VEs were rare (<1.0%), VE Couplets were rare (<1.0%), and no VE Triplets were present.    Gwyndolyn Kaufman, MD Carotid ultrasound 03/2022: IMPRESSION: Color duplex indicates minimal heterogeneous plaque, with no hemodynamically significant stenosis by duplex criteria in the extracranial cerebrovascular circulation.  EKG:  EKG is  ordered today.  The ekg ordered today demonstrates NSR with HR 66  Recent Labs: 02/18/2022: Hemoglobin 14.3; Platelets 269 05/29/2022: ALT 7; BUN 13; Creatinine, Ser 1.01; Potassium 4.4; Sodium 141; TSH 1.800  Recent Lipid Panel    Component Value Date/Time   CHOL 223 (H) 02/18/2022 1103   TRIG 123 02/18/2022 1103   HDL 74 02/18/2022 1103   CHOLHDL 3.0 02/18/2022 1103   CHOLHDL 3 10/09/2010 0847   VLDL 18.6 10/09/2010 0847   LDLCALC 128 (H) 02/18/2022 1103   LDLDIRECT 131.0 10/09/2010 0847     Risk Assessment/Calculations:           Physical Exam:    VS:  There were no vitals taken for this visit.    Wt Readings from Last 3 Encounters:  05/29/22 163 lb 3.2 oz (74 kg)  04/23/22 163 lb (73.9 kg)   03/25/22 163 lb 6.4 oz (74.1 kg)     GEN:  Well nourished, well developed in no acute distress HEENT: Normal NECK: No JVD; No carotid bruits LYMPHATICS: No lymphadenopathy CARDIAC: RRR, 1/6 systolic murmur RESPIRATORY:  Clear to auscultation without rales, wheezing or rhonchi  ABDOMEN: Soft, non-tender, non-distended MUSCULOSKELETAL:  No edema; No deformity  SKIN: Warm and dry NEUROLOGIC:  Alert and oriented x 3 PSYCHIATRIC:  Normal affect   ASSESSMENT:    No diagnosis found.  PLAN:    In order of problems listed above:  #Syncope: Likely orthostatic in nature. Cardiac monitor with no arrhythmias, rare SVE/VE. TTE 04/2022 with normal BiV function and no significant valve disease. Myoview 04/2022 with normal perfusion. EF 66%.   -Reassuring CV work-up  #DOE: Myoview with normal perfusion. TTE with normal BiV function and no significant valve disease.   #CKD IIIB: -Followed by Dr. Hollie Salk  #HTN: -Continue lisinopril '5mg'$  daily -Hold HCTZ 12.'5mg'$  daily -Increase metoprolol to '25mg'$  BID  #HLD: -Continue pravastatin '40mg'$  daily         Medication Adjustments/Labs and Tests Ordered: Current medicines are reviewed at length with the patient today.  Concerns regarding medicines are outlined above.  No orders of the defined types were placed in this encounter.  No orders of the defined types were placed in this encounter.   There are no Patient Instructions on file for this visit.   Signed, Freada Bergeron, MD  07/08/2022 7:57 PM    [facility]

## 2022-07-09 ENCOUNTER — Other Ambulatory Visit (HOSPITAL_BASED_OUTPATIENT_CLINIC_OR_DEPARTMENT_OTHER): Payer: Self-pay

## 2022-07-09 ENCOUNTER — Ambulatory Visit: Payer: Medicare Other | Attending: Cardiology | Admitting: Cardiology

## 2022-07-09 ENCOUNTER — Encounter: Payer: Self-pay | Admitting: Cardiology

## 2022-07-09 VITALS — BP 110/60 | HR 77 | Ht 67.0 in | Wt 171.2 lb

## 2022-07-09 DIAGNOSIS — R002 Palpitations: Secondary | ICD-10-CM | POA: Insufficient documentation

## 2022-07-09 DIAGNOSIS — R0602 Shortness of breath: Secondary | ICD-10-CM | POA: Diagnosis not present

## 2022-07-09 DIAGNOSIS — I10 Essential (primary) hypertension: Secondary | ICD-10-CM | POA: Diagnosis present

## 2022-07-09 DIAGNOSIS — N1832 Chronic kidney disease, stage 3b: Secondary | ICD-10-CM | POA: Diagnosis not present

## 2022-07-09 DIAGNOSIS — E782 Mixed hyperlipidemia: Secondary | ICD-10-CM | POA: Diagnosis not present

## 2022-07-09 DIAGNOSIS — R55 Syncope and collapse: Secondary | ICD-10-CM | POA: Diagnosis not present

## 2022-07-09 MED ORDER — LISINOPRIL 5 MG PO TABS: 5.0000 mg | ORAL_TABLET | Freq: Every day | ORAL | 2 refills | Status: DC

## 2022-07-09 MED ORDER — METOPROLOL TARTRATE 25 MG PO TABS: 25.0000 mg | ORAL_TABLET | Freq: Two times a day (BID) | ORAL | 1 refills | Status: DC

## 2022-07-09 MED ORDER — COMIRNATY 30 MCG/0.3ML IM SUSY
PREFILLED_SYRINGE | INTRAMUSCULAR | 0 refills | Status: DC
  Filled 2022-07-09: qty 0.3, 1d supply, fill #0

## 2022-07-09 NOTE — Patient Instructions (Signed)
Medication Instructions:   STOP TAKING HYDROCHLOROTHIAZIDE NOW  INCREASE YOUR METOPROLOL TARTRATE (LOPRESSOR) TO 25 MG BY MOUTH TWICE DAILY  *If you need a refill on your cardiac medications before your next appointment, please call your pharmacy*    Follow-Up:  3 MONTHS WITH AN EXTENDER IN THE OFFICE--PLEASE SCHEDULE WITH AN EXTENDER    Important Information About Sugar

## 2022-07-09 NOTE — Progress Notes (Signed)
Cardiology Office Note:    Date:  07/09/2022   ID:  Dana Finley, DOB [birth year], MRN [id number]  PCP:  No primary care provider on file.   [facility] Providers Cardiologist:  None {  Referring MD: No ref. provider found    History of Present Illness:    Dana Finley is a 66 y.o. female with a hx of anxiety, HTN, depression, and migraines who presents to clinic for follow-up.  Was seen by Dr. Hollie Salk in clinic on 02/27/22 where she was dizzy spells, lightheadedness and presyncope. Her HCTZ was stopped and she was referred to Cardiology for further work-up.  Was last seen in clinic on 03/2022 where she was having continued dizzy spells. Reported she has had several episodes of syncope over the past 43month. One episode was in 10/2021 when she was standing on a stool and suddenly lost consciousness and hit her head and required stitches. Went to ER at that time where work-up was unremarkable. Had another episode in 12/2021 where she stood to get out of the car after driving for several hours and feel to the floor.   Cardiac monitor 04/2022 with NSR with 12 runs of nonsustained SVT, rare SVE/VE. TTE 04/2022 with LVEF 50-55%, G1DD, normal RV, no significant valve disease. Myoview 04/2022 with normal perfusion. EF 66%.  Today, she is accompanied by a family member. The patient states that she is still feeling dizzy regardless of standing or lying down. Her dizziness is not necessarily positional; she confirms that she could begin feeling dizzy while sitting. In fact this did occur while she was sitting on the exam table. When it occurs her dizziness may be severe and she states she feels like she might faint. She has associated flushed and clammy feelings. Also, during these episodes her diastolic blood pressure tends to be closer to 56-60. Otherwise, her blood pressure seems to be more stable at home. Continues to notice palpitations and episodes where her HR will go up to 110-115 with  sitting. Of note, after starting metoprolol her episodes seemed to improve temporarily. She believes that her body is becoming accustomed to the metoprolol dose since the episodes are worsening again, especially the heart flutters.  Additionally she complains of persistent LE heaviness that correlates with exertion such as going up stairs. However, she also has some LE discomfort and pain in her calves while lying down at night.  She denies any chest pain, shortness of breath, or peripheral edema. No orthopnea, or PND.  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   Qualifier: Diagnosis of  By: SNiel HummerMD, WLorinda Creed   Diverticula of colon    Hemorrhoids    History of colonic polyps hyperplastic   Hypertension    Hypothyroidism    IBS (irritable bowel syndrome)    Insomnia, persistent    Migraine headache without aura    lumbosacral disc desease   Osteopenia    OSTEOPENIA 06/30/2007   Qualifier: Diagnosis of  By: SNiel HummerMD, WLorinda Creed   RHINITIS 12/27/2009   Qualifier: Diagnosis  of  By: Niel Hummer MD, Lorinda Creed    Serrated adenoma of colon 06/2012   Skin lesion 11/26/2021   Weight loss    WEIGHT LOSS 08/20/2007   Qualifier: Diagnosis of  By: Niel Hummer MD, Lorinda Creed    Wellness examination 02/26/2022    Past Surgical History:  Procedure Laterality Date   ABDOMINAL HYSTERECTOMY     1996   APPENDECTOMY     CATARACT EXTRACTION     bilateral   COLONOSCOPY     EYE SURGERY     muscle surgery; has had approx 18 surgeries on both eyes   POLYPECTOMY      Current Medications: Current Meds  Medication  Sig   amphetamine-dextroamphetamine (ADDERALL) 30 MG tablet Take 1 tablet by mouth 2 (two) times daily.   levothyroxine (SYNTHROID, LEVOTHROID) 25 MCG tablet Take 25 mcg by mouth daily.    lisinopril (ZESTRIL) 5 MG tablet Take 1 tablet (5 mg total) by mouth daily.   metoprolol tartrate (LOPRESSOR) 25 MG tablet Take 1 tablet (25 mg total) by mouth 2 (two) times daily.   pravastatin (PRAVACHOL) 40 MG tablet Take 1 tablet (40 mg total) by mouth daily.   sertraline (ZOLOFT) 100 MG tablet Take 1.5 tablets (150 mg total) by mouth daily.   traZODone (DESYREL) 150 MG tablet Take 1-2 tablets (150-300 mg total) by mouth at bedtime as needed.   zolmitriptan (ZOMIG) 5 MG tablet Take 1 tablet by mouth as needed for migraine. (Patient taking differently: Take 5 mg by mouth as needed for migraine. Pt takes 1-2 tables daily as needed.)   [DISCONTINUED] hydrochlorothiazide (MICROZIDE) 12.5 MG capsule Take 1 capsule (12.5 mg total) by mouth daily.   [DISCONTINUED] lisinopril (ZESTRIL) 10 MG tablet Take 0.5 tablets (5 mg total) by mouth daily.   [DISCONTINUED] lisinopril-hydrochlorothiazide (ZESTORETIC) 10-12.5 MG tablet Take 1 tablet by mouth every morning.   [DISCONTINUED] metoprolol tartrate (LOPRESSOR) 25 MG tablet Take 0.5 tablets (12.5 mg total) by mouth 2 (two) times daily.     Allergies:   Codeine, Lisinopril, and Morphine and related   Social History   Socioeconomic History   Marital status: Married    Spouse name: Not on file   Number of children: 2   Years of education: Not on file   Highest education level: Not on file  Occupational History   Occupation: home    Employer: UNEMPLOYED  Tobacco Use   Smoking status: Former    Types: Cigarettes    Quit date: 06/12/1977    Years since quitting: 45.1   Smokeless tobacco: Never  Substance and Sexual Activity   Alcohol use: Yes    Comment: occasional   Drug use: No   Sexual activity: Not on file  Other Topics Concern   Not on file  Social  History Narrative   Not on file   Social Determinants of Health   Financial Resource Strain: Not on file  Food Insecurity: Not on file  Transportation Needs: Not on file  Physical Activity: Not on file  Stress: Not on file  Social Connections: Not on file     Family History: The patient's family history includes Colon cancer (age of onset: 75) in her cousin; Colon cancer (age of onset: 75) in her maternal uncle and sister; Colon polyps in her son; Diabetes in her mother; Early death in her sister; Heart disease in her mother; Leukemia in her brother; Pancreatic cancer (age of onset: 55) in her father; Stomach cancer (age of  onset: 20) in her maternal uncle; Stroke in her mother. There is no history of Rectal cancer.  ROS:   Review of Systems  Constitutional:  Negative for chills and fever.  HENT:  Negative for nosebleeds and tinnitus.   Eyes:  Negative for blurred vision and pain.  Respiratory:  Negative for cough, hemoptysis, shortness of breath and stridor.   Cardiovascular:  Positive for palpitations. Negative for chest pain, orthopnea, claudication, leg swelling and PND.  Gastrointestinal:  Negative for blood in stool, diarrhea, nausea and vomiting.  Genitourinary:  Negative for dysuria and hematuria.  Musculoskeletal:  Positive for myalgias (BLE calves). Negative for falls.  Neurological:  Positive for dizziness and sensory change (BLE heaviness). Negative for loss of consciousness.  Psychiatric/Behavioral:  Negative for depression, hallucinations and substance abuse. The patient does not have insomnia.      EKGs/Labs/Other Studies Reviewed:    The following studies were reviewed today:  Myoview 04/2022:   The study is normal. The study is low risk.   No ST deviation was noted.   LV perfusion is abnormal. There is a small defect with moderate reduction in uptake present in the apex location(s) that is fixed. The defect appears worse at rest than with stress and there is  normal wall motion in the defect area. Consistent with artifact caused by subdiaphragmatic activity.   Left ventricular function is normal. Nuclear stress EF: 66 %. The left ventricular ejection fraction is hyperdynamic (>65%). End diastolic cavity size is normal.   Prior study not available for comparison.  TTE 04/2022: IMPRESSIONS    1. Left ventricular ejection fraction, by estimation, is 50 to 55%. Left  ventricular ejection fraction by 3D volume is 54 %. The left ventricle has  low normal function. The left ventricle has no regional wall motion  abnormalities. Left ventricular  diastolic parameters are consistent with Grade I diastolic dysfunction  (impaired relaxation).   2. Right ventricular systolic function is normal. The right ventricular  size is normal.   3. The mitral valve is normal in structure. No evidence of mitral valve  regurgitation. No evidence of mitral stenosis.   4. The aortic valve is tricuspid. Aortic valve regurgitation is not  visualized. No aortic stenosis is present.   5. The inferior vena cava is normal in size with greater than 50%  respiratory variability, suggesting right atrial pressure of 3 mmHg.   Cardiac Monitor 04/2022:   Patch wear time was 6 days and 22 hours   Predominant rhythm was NSR with average HR 69bpm   There were 12 runs of nonsustained SVT with longest lasting 13 beats   Rare SVE (<1%), rare VE (<1%)   Patient triggered events correlate with NSR, sinus tach, SVE and PVCs   No sustained arrhythmias or significant pauses     Patch Wear Time:  6 days and 22 hours (2023-08-19T13:36:04-399 to 2023-08-26T11:57:11-398)   Patient had a min HR of 46 bpm, max HR of 174 bpm, and avg HR of 69 bpm. Predominant underlying rhythm was Sinus Rhythm. 12 Supraventricular Tachycardia runs occurred, the run with the fastest interval lasting 12 beats with a max rate of 174 bpm, the  longest lasting 13 beats with an avg rate of 133 bpm. Isolated SVEs  were rare (<1.0%), SVE Couplets were rare (<1.0%), and SVE Triplets were rare (<1.0%). Isolated VEs were rare (<1.0%), VE Couplets were rare (<1.0%), and no VE Triplets were present.    Carotid ultrasound 03/2022: IMPRESSION: Color duplex indicates minimal  heterogeneous plaque, with no hemodynamically significant stenosis by duplex criteria in the extracranial cerebrovascular circulation.  EKG:  EKG is personally reviewed. 07/09/2022:  EKG was not ordered. 03/25/2022:  NSR with HR 66  Recent Labs: 02/18/2022: Hemoglobin 14.3; Platelets 269 05/29/2022: ALT 7; BUN 13; Creatinine, Ser 1.01; Potassium 4.4; Sodium 141; TSH 1.800   Recent Lipid Panel    Component Value Date/Time   CHOL 223 (H) 02/18/2022 1103   TRIG 123 02/18/2022 1103   HDL 74 02/18/2022 1103   CHOLHDL 3.0 02/18/2022 1103   CHOLHDL 3 10/09/2010 0847   VLDL 18.6 10/09/2010 0847   LDLCALC 128 (H) 02/18/2022 1103   LDLDIRECT 131.0 10/09/2010 0847     Risk Assessment/Calculations:           Physical Exam:    VS:  BP 110/60   Pulse 77   Ht '5\' 7"'$  (1.702 m)   Wt 171 lb 3.2 oz (77.7 kg)   SpO2 94%   BMI 26.81 kg/m     Wt Readings from Last 3 Encounters:  07/09/22 171 lb 3.2 oz (77.7 kg)  05/29/22 163 lb 3.2 oz (74 kg)  04/23/22 163 lb (73.9 kg)     GEN:  Well nourished, well developed in no acute distress HEENT: Normal NECK: No JVD; No carotid bruits CARDIAC: RRR, 1/6 systolic murmur, 2+ DP pulses RESPIRATORY:  Clear to auscultation without rales, wheezing or rhonchi  ABDOMEN: Soft, non-tender, non-distended MUSCULOSKELETAL:  No edema; No deformity  SKIN: Warm and dry NEUROLOGIC:  Alert and oriented x 3 PSYCHIATRIC:  Normal affect   ASSESSMENT:    1. Syncope, unspecified syncope type   2. SOB (shortness of breath) on exertion   3. Stage 3b chronic kidney disease ([place])   4. Mixed hyperlipidemia   5. Primary hypertension   6. Palpitations     PLAN:    In order of problems listed  above:  #Syncope: Likely orthostatic in nature. Cardiac monitor with no arrhythmias, rare SVE/VE. TTE 04/2022 with normal BiV function and no significant valve disease. Myoview 04/2022 with normal perfusion. EF 66%.   -Reassuring CV work-up  #Persistent dizziness: Not positional and can occur with sitting despite normal BP. Unclear etiology. EEG negative. MRI head reportedly without acute pathology. Will check with Pharm D to see if side-effect of med. Will hold HCTZ for now.   #Palpitations: Cardiac monitor with brief episodes of SVT, rare ectopy. Triggered events correlated with SVE/PVC. Symptoms improved with metop. Will uptitrate dosing to '25mg'$  BID and see if this helps. -Increase metop '25mg'$  BID  #DOE: Myoview with normal perfusion. TTE with normal BiV function and no significant valve disease.   #CKD IIIB: -Followed by Dr. Hollie Salk  #HTN: -Continue lisinopril '5mg'$  daily -Increase metop to '25mg'$  BID -Hold HCTZ due to dizziness and concern for episode of orthostatic syncope -BP controlled and at goal  #HLD: -Continue pravastatin '40mg'$  daily       Follow-up:  3 months with APP.  Medication Adjustments/Labs and Tests Ordered: Current medicines are reviewed at length with the patient today.  Concerns regarding medicines are outlined above.   No orders of the defined types were placed in this encounter.  Meds ordered this encounter  Medications   metoprolol tartrate (LOPRESSOR) 25 MG tablet    Sig: Take 1 tablet (25 mg total) by mouth 2 (two) times daily.    Dispense:  180 tablet    Refill:  1    Dose increase   lisinopril (ZESTRIL) 5 MG  tablet    Sig: Take 1 tablet (5 mg total) by mouth daily.    Dispense:  90 tablet    Refill:  2   Patient Instructions  Medication Instructions:   STOP TAKING HYDROCHLOROTHIAZIDE NOW  INCREASE YOUR METOPROLOL TARTRATE (LOPRESSOR) TO 25 MG BY MOUTH TWICE DAILY  *If you need a refill on your cardiac medications before your next  appointment, please call your pharmacy*    Follow-Up:  3 MONTHS WITH AN EXTENDER IN THE OFFICE--PLEASE SCHEDULE WITH AN EXTENDER    Important Information About Sugar         I,Mathew Stumpf,acting as a scribe for Freada Bergeron, MD.,have documented all relevant documentation on the behalf of Freada Bergeron, MD,as directed by  Freada Bergeron, MD while in the presence of Freada Bergeron, MD.  I, Freada Bergeron, MD, have reviewed all documentation for this visit. The documentation on 07/09/22 for the exam, diagnosis, procedures, and orders are all accurate and complete.   Signed, Freada Bergeron, MD  07/09/2022 2:32 PM    [place]

## 2022-07-25 ENCOUNTER — Other Ambulatory Visit (HOSPITAL_BASED_OUTPATIENT_CLINIC_OR_DEPARTMENT_OTHER): Payer: Self-pay

## 2022-07-25 MED ORDER — AREXVY 120 MCG/0.5ML IM SUSR
INTRAMUSCULAR | 0 refills | Status: DC
  Filled 2022-07-25: qty 0.5, 1d supply, fill #0

## 2022-09-06 ENCOUNTER — Encounter: Payer: Self-pay | Admitting: Internal Medicine

## 2022-09-11 ENCOUNTER — Other Ambulatory Visit (HOSPITAL_BASED_OUTPATIENT_CLINIC_OR_DEPARTMENT_OTHER): Payer: Self-pay | Admitting: Nurse Practitioner

## 2022-09-11 ENCOUNTER — Encounter: Payer: Self-pay | Admitting: Nurse Practitioner

## 2022-09-11 DIAGNOSIS — F909 Attention-deficit hyperactivity disorder, unspecified type: Secondary | ICD-10-CM

## 2022-09-11 NOTE — Telephone Encounter (Signed)
Refill request last apt 05/29/22 pt. Sent message to schedule a future apt. With you.

## 2022-09-12 ENCOUNTER — Other Ambulatory Visit (HOSPITAL_BASED_OUTPATIENT_CLINIC_OR_DEPARTMENT_OTHER): Payer: Self-pay

## 2022-09-12 MED ORDER — ZOLMITRIPTAN 5 MG PO TABS
5.0000 mg | ORAL_TABLET | ORAL | 11 refills | Status: DC | PRN
  Filled 2022-09-12: qty 36, 90d supply, fill #0
  Filled 2023-02-07: qty 36, 90d supply, fill #1

## 2022-09-12 MED ORDER — AMPHETAMINE-DEXTROAMPHETAMINE 30 MG PO TABS: 30.0000 mg | ORAL_TABLET | Freq: Two times a day (BID) | ORAL | 0 refills | Status: DC

## 2022-09-30 ENCOUNTER — Telehealth: Payer: Self-pay | Admitting: Cardiology

## 2022-09-30 NOTE — Telephone Encounter (Signed)
Pt's husband aware of recommendations and agrees with plan Pt has appt already scheduled for 10/09/22

## 2022-09-30 NOTE — Telephone Encounter (Signed)
Pt c/o of Chest Pain: STAT if CP now or developed within 24 hours  1. Are you having CP right now? No   2. Are you experiencing any other symptoms (ex. SOB, nausea, vomiting, sweating)? Some nausea   3. How long have you been experiencing CP? Just today, about 5 minutes   4. Is your CP continuous or coming and going? Coming and going   5. Have you taken Nitroglycerin? No  ?   Pt spouse states pt had an episode of syncope. He associated syncope with pt hr being very fast. He did not report she got dizzy or lightheaded or passed out.    ?

## 2022-09-30 NOTE — Telephone Encounter (Signed)
Spoke with Dana Finley and Dana Finley's husband and Dana Finley had episode of chest pain that lasted for 5 minutes and then went into "heart fluttering and this last about 30 minutes to an hour "Per Dana Finley since med adjustments have been made dizziness has subsided Dana Finley did note some SOB but no other symptoms Per Dana Finley B/p was 154/78 and  HR 56 Will forward to Dr Johney Frame for review Per Dana Finley feels fine at this time

## 2022-10-08 NOTE — Progress Notes (Unsigned)
Office Visit    Patient Name: Dana Finley Date of Encounter: 10/09/2022  PCP:  Orma Render, NP   [place]  Cardiologist:  Freada Bergeron, MD  Advanced Practice Provider:  No care team member to display Electrophysiologist:  None   HPI    Dana Finley is a 67 y.o. female with a past medical history of anxiety, hypertension, depression, and migraines presents today for follow-up appointment.  She was seen by Dr. Hollie Salk in clinic 02/27/2022 where she was having dizzy spells, lightheadedness, and presyncope.  Her HCTZ was stopped she was referred to cardiology for further workup.  She was seen in the clinic 03/2022 which is having continued dizziness spells.  Reported that she was havingseveral episodes of syncope over the past 6 months.  1 episode was 10/2021 when she was standing on a stool and suddenly lost consciousness and hit her head and required stitches.  Went to the ER at that time for further workup which was unremarkable.  Had another episode 12/2021 where she stood to get out of her car after driving for several hours and fell to the floor.  Cardiac monitor 04/2022 with normal sinus rhythm with 12 runs of nonsustained SVT, rare SVE/VE.  TTE 04/2022 with LVEF 50 to 55%, G1 DD, normal RV, no significant valve disease.  Myoview 04/2022 with normal perfusion, EF 66%.  She was seen by Dr. Johney Frame 07/09/2022.  She stated that she was still feeling dizzy regardless of standing or laying down.  Dizziness was not necessarily positional.  She confirms that she could begin feeling dizzy while sitting.  In fact, this did occur when she was sitting on the exam table.  When it occurs her dizziness may be severe and she states she feels like she might faint.  She is associated flushed and clammy feelings.  Also during the episode her diastolic blood pressure tends to be closer to 56-60.  Otherwise her blood pressure seems stable.  Continue to notice palpitations and  episodes where her heart rate will go up to 110 to 115 bpm with sitting.  Of note after starting metoprolol her episodes seem to improve temporarily.  Believe that her body was becoming accustomed to the metoprolol dose.  Additionally having persistent lower extremity heaviness that she correlated with exertion such as going up stairs.  Today, she tells me she has been getting better since her metoprolol was increased to 25 mg twice daily.  Her dose of lisinopril was decreased to give her a little more blood pressure room.  Blood pressure stable today.  No dizziness.  She did have some chest pain while laying in bed that lasted for about 5 minutes followed by some faster heartbeats that lasted around 30 minutes to an hour.  She called our office and was told to take an extra half of her metoprolol if this occurs again.  That incident did not occur again however, she did have an episode of sharp fleeting chest pain which happened when she was standing.  This is very atypical for cardiac pain.  Nonexertional.  She is worried because her mother has a history of heart attacks.  We reviewed her stress test from back in September which was negative.  She also shares that after the first episode of the chest pain and palpitations her blood pressure was high 154/78 with heart rate 56.  She has had variable blood pressures in the past however, today it is been  well-controlled and per patient report has been well-controlled at home with recent medication changes.  She drinks 1 cup of coffee in the morning and does not drink alcohol.  We discussed switching to decaf and also increasing hydration to 64 ounces daily.  Reports no shortness of breath nor dyspnea on exertion.  No edema, orthopnea, PND.  Past Medical History    Past Medical History:  Diagnosis Date   ACHILLES TENDINITIS 12/19/2009   Qualifier: Diagnosis of  By: Niel Hummer MD, Lorinda Creed    Acute upper respiratory infection 05/31/2016   ADD (attention  deficit disorder)    Anxiety    Chalazion of right lower eyelid 10/08/2012   COLONIC POLYPS, HYPERPLASTIC 11/19/2005   Qualifier: Diagnosis of  By: Jerral Ralph     Depression    [place] DISEASE, LUMBOSACRAL SPINE 12/07/2009   Qualifier: Diagnosis of  By: Niel Hummer MD, Lorinda Creed    Diverticula of colon    Hemorrhoids    History of colonic polyps hyperplastic   Hypertension    Hypothyroidism    IBS (irritable bowel syndrome)    Insomnia, persistent    Migraine headache without aura    lumbosacral disc desease   Osteopenia    OSTEOPENIA 06/30/2007   Qualifier: Diagnosis of  By: Niel Hummer MD, Lorinda Creed    RHINITIS 12/27/2009   Qualifier: Diagnosis of  By: Niel Hummer MD, Willie R    Serrated adenoma of colon 06/2012   Skin lesion 11/26/2021   Weight loss    WEIGHT LOSS 08/20/2007   Qualifier: Diagnosis of  By: Niel Hummer MD, Lorinda Creed    Wellness examination 02/26/2022   Past Surgical History:  Procedure Laterality Date   ABDOMINAL HYSTERECTOMY     1996   APPENDECTOMY     CATARACT EXTRACTION     bilateral   COLONOSCOPY     EYE SURGERY     muscle surgery; has had approx 18 surgeries on both eyes   POLYPECTOMY      Allergies  Allergies  Allergen Reactions   Codeine Nausea And Vomiting   Lisinopril     Becomes very sleepy   Morphine And Related     Gi upset     EKGs/Labs/Other Studies Reviewed:   The following studies were reviewed today:   Myoview 04/2022:   The study is normal. The study is low risk.   No ST deviation was noted.   LV perfusion is abnormal. There is a small defect with moderate reduction in uptake present in the apex location(s) that is fixed. The defect appears worse at rest than with stress and there is normal wall motion in the defect area. Consistent with artifact caused by subdiaphragmatic activity.   Left ventricular function is normal. Nuclear stress EF: 66 %. The left ventricular ejection fraction is hyperdynamic (>65%). End diastolic  cavity size is normal.   Prior study not available for comparison.   TTE 04/2022: IMPRESSIONS    1. Left ventricular ejection fraction, by estimation, is 50 to 55%. Left  ventricular ejection fraction by 3D volume is 54 %. The left ventricle has  low normal function. The left ventricle has no regional wall motion  abnormalities. Left ventricular  diastolic parameters are consistent with Grade I diastolic dysfunction  (impaired relaxation).   2. Right ventricular systolic function is normal. The right ventricular  size is normal.   3. The mitral valve is normal in structure. No evidence of mitral valve  regurgitation. No evidence  of mitral stenosis.   4. The aortic valve is tricuspid. Aortic valve regurgitation is not  visualized. No aortic stenosis is present.   5. The inferior vena cava is normal in size with greater than 50%  respiratory variability, suggesting right atrial pressure of 3 mmHg.    Cardiac Monitor 04/2022:   Patch wear time was 6 days and 22 hours   Predominant rhythm was NSR with average HR 69bpm   There were 12 runs of nonsustained SVT with longest lasting 13 beats   Rare SVE (<1%), rare VE (<1%)   Patient triggered events correlate with NSR, sinus tach, SVE and PVCs   No sustained arrhythmias or significant pauses     Patch Wear Time:  6 days and 22 hours (2023-08-19T13:36:04-399 to 2023-08-26T11:57:11-398)   Patient had a min HR of 46 bpm, max HR of 174 bpm, and avg HR of 69 bpm. Predominant underlying rhythm was Sinus Rhythm. 12 Supraventricular Tachycardia runs occurred, the run with the fastest interval lasting 12 beats with a max rate of 174 bpm, the  longest lasting 13 beats with an avg rate of 133 bpm. Isolated SVEs were rare (<1.0%), SVE Couplets were rare (<1.0%), and SVE Triplets were rare (<1.0%). Isolated VEs were rare (<1.0%), VE Couplets were rare (<1.0%), and no VE Triplets were present.    Carotid ultrasound 03/2022: IMPRESSION: Color duplex  indicates minimal heterogeneous plaque, with no hemodynamically significant stenosis by duplex criteria in the extracranial cerebrovascular circulation.  EKG:  EKG is not ordered today.    Recent Labs: 02/18/2022: Hemoglobin 14.3; Platelets 269 05/29/2022: ALT 7; BUN 13; Creatinine, Ser 1.01; Potassium 4.4; Sodium 141; TSH 1.800  Recent Lipid Panel    Component Value Date/Time   CHOL 223 (H) 02/18/2022 1103   TRIG 123 02/18/2022 1103   HDL 74 02/18/2022 1103   CHOLHDL 3.0 02/18/2022 1103   CHOLHDL 3 10/09/2010 0847   VLDL 18.6 10/09/2010 0847   LDLCALC 128 (H) 02/18/2022 1103   LDLDIRECT 131.0 10/09/2010 0847     Home Medications   Current Meds  Medication Sig   amphetamine-dextroamphetamine (ADDERALL) 30 MG tablet Take 1 tablet by mouth 2 (two) times daily.   COVID-19 mRNA vaccine 2023-2024 (COMIRNATY) syringe Inject into the muscle.   levothyroxine (SYNTHROID, LEVOTHROID) 25 MCG tablet Take 25 mcg by mouth daily.    lisinopril (ZESTRIL) 5 MG tablet Take 1 tablet (5 mg total) by mouth daily.   metoprolol tartrate (LOPRESSOR) 25 MG tablet Take 1 tablet (25 mg total) by mouth 2 (two) times daily.   metoprolol tartrate (LOPRESSOR) 25 MG tablet Take 12.5 mg by mouth 2 (two) times daily. Pt takes an extra 1/2 tablet 12.5 mg with heart rating episodes.   Polysacch Fe Cmp-Fe Heme Poly (BIFERA) 28 MG TABS Take 1 tablet by mouth daily at 6 (six) AM.   pravastatin (PRAVACHOL) 40 MG tablet Take 1 tablet (40 mg total) by mouth daily.   RSV vaccine recomb adjuvanted (AREXVY) 120 MCG/0.5ML injection Inject into the muscle.   sertraline (ZOLOFT) 100 MG tablet Take 1.5 tablets (150 mg total) by mouth daily.   traZODone (DESYREL) 150 MG tablet Take 1-2 tablets (150-300 mg total) by mouth at bedtime as needed.   zolmitriptan (ZOMIG) 5 MG tablet Take 1 tablet (5 mg total) by mouth as needed for migraine.     Review of Systems      All other systems reviewed and are otherwise negative except  as noted above.  Physical Exam  VS:  BP 130/80   Pulse 62   Ht '5\' 7"'$  (1.702 m)   Wt 161 lb 6.4 oz (73.2 kg)   SpO2 96%   BMI 25.28 kg/m  , BMI Body mass index is 25.28 kg/m.  Wt Readings from Last 3 Encounters:  10/09/22 161 lb 6.4 oz (73.2 kg)  07/09/22 171 lb 3.2 oz (77.7 kg)  05/29/22 163 lb 3.2 oz (74 kg)     GEN: Well nourished, well developed, in no acute distress. HEENT: normal. Neck: Supple, no JVD, carotid bruits, or masses. Cardiac:RRR, no murmurs, rubs, or gallops. No clubbing, cyanosis, edema.  Radials/PT 2+ and equal bilaterally.  Respiratory:  Respirations regular and unlabored, clear to auscultation bilaterally. GI: Soft, nontender, nondistended. MS: No deformity or atrophy. Skin: Warm and dry, no rash. Neuro:  Strength and sensation are intact. Psych: Normal affect.  Assessment & Plan    Syncope -no further episodes and no further dizziness -continue current medications  Palpitations -two episodes since her last appointment, one with chest pain and then palpitations for about 30 minutes-1 hour. The second with fleeting chest pain.  -discussed adding 12.'5mg'$  metoprolol if episode occurs -HR 62 and BP 130/80. Would continue metoprolol 25 mg BID  Stage IIIb chronic kidney disease -creatinine stable 1.01  Mixed hyperlipidemia -LDL 128, goal < 100 -will get an updated lipid panel and LFTs today -continue pravastatin 40 mg daily for now  Hypertension -BP much better controlled, dizziness resolved -continue current medications: Lisinopril 5 mg daily, Lopressor 25 mg twice daily         Disposition: Follow up 3 months with Freada Bergeron, MD or APP.  Signed, Elgie Collard, PA-C 10/09/2022, 1:42 PM [facility]

## 2022-10-09 ENCOUNTER — Ambulatory Visit: Payer: PRIVATE HEALTH INSURANCE | Admitting: Nurse Practitioner

## 2022-10-09 ENCOUNTER — Ambulatory Visit: Payer: Medicare Other | Attending: Nurse Practitioner | Admitting: Physician Assistant

## 2022-10-09 ENCOUNTER — Encounter: Payer: Self-pay | Admitting: Physician Assistant

## 2022-10-09 VITALS — BP 130/80 | HR 62 | Ht 67.0 in | Wt 161.4 lb

## 2022-10-09 DIAGNOSIS — I10 Essential (primary) hypertension: Secondary | ICD-10-CM | POA: Diagnosis present

## 2022-10-09 DIAGNOSIS — E785 Hyperlipidemia, unspecified: Secondary | ICD-10-CM

## 2022-10-09 DIAGNOSIS — N1832 Chronic kidney disease, stage 3b: Secondary | ICD-10-CM

## 2022-10-09 DIAGNOSIS — R002 Palpitations: Secondary | ICD-10-CM

## 2022-10-09 DIAGNOSIS — R0602 Shortness of breath: Secondary | ICD-10-CM | POA: Diagnosis present

## 2022-10-09 DIAGNOSIS — R55 Syncope and collapse: Secondary | ICD-10-CM

## 2022-10-09 NOTE — Patient Instructions (Addendum)
Medication Instructions:  Your physician recommends that you continue on your current medications as directed. Please refer to the Current Medication list given to you today.  *If you need a refill on your cardiac medications before your next appointment, please call your pharmacy*   Lab Work: Lipids and lft's on a day that you are fasting If you have labs (blood work) drawn today and your tests are completely normal, you will receive your results only by: [place] (if you have MyChart) OR A paper copy in the mail If you have any lab test that is abnormal or we need to change your treatment, we will call you to review the results.   Follow-Up: At [facility], you and your health needs are our priority.  As part of our continuing mission to provide you with exceptional heart care, we have created designated Provider Care Teams.  These Care Teams include your primary Cardiologist (physician) and Advanced Practice Providers (APPs -  Physician Assistants and Nurse Practitioners) who all work together to provide you with the care you need, when you need it.   Your next appointment:   3 month(s)  Provider:   Dr Johney Frame or Nicholes Rough, PA-C

## 2022-10-10 ENCOUNTER — Encounter: Payer: Self-pay | Admitting: Nurse Practitioner

## 2022-10-22 ENCOUNTER — Other Ambulatory Visit (HOSPITAL_BASED_OUTPATIENT_CLINIC_OR_DEPARTMENT_OTHER): Payer: Self-pay | Admitting: Physician Assistant

## 2022-10-23 LAB — LIPID PANEL
Chol/HDL Ratio: 2.5 ratio (ref 0.0–4.4)
Cholesterol, Total: 175 mg/dL (ref 100–199)
HDL: 69 mg/dL (ref >39)
LDL Chol Calc (NIH): 88 mg/dL (ref 0–99)
Triglycerides: 103 mg/dL (ref 0–149)
VLDL Cholesterol Cal: 18 mg/dL (ref 5–40)

## 2022-10-23 LAB — HEPATIC FUNCTION PANEL
ALT: 14 IU/L (ref 0–32)
AST: 20 IU/L (ref 0–40)
Albumin: 4.2 g/dL (ref 3.9–4.9)
Alkaline Phosphatase: 60 IU/L (ref 44–121)
Bilirubin Total: 0.4 mg/dL (ref 0.0–1.2)
Bilirubin, Direct: <0.1 mg/dL (ref 0.00–0.40)
Total Protein: 6.6 g/dL (ref 6.0–8.5)

## 2022-11-12 ENCOUNTER — Telehealth: Payer: Self-pay | Admitting: Nurse Practitioner

## 2022-11-12 NOTE — Telephone Encounter (Signed)
Called patient to schedule Medicare Annual Wellness Visit (AWV). Left message for patient to call back and schedule Medicare Annual Wellness Visit (AWV).  Last date of AWV: awvi 07/12/21 per [facility]   Please schedule an appointment at any time with [facility] [place].  If any questions, please contact me at [phone].  Thank you ,  Shirlean Mylar  [phone]

## 2022-11-14 ENCOUNTER — Telehealth: Payer: Self-pay | Admitting: Nurse Practitioner

## 2022-11-14 NOTE — Telephone Encounter (Signed)
Called patient to schedule Medicare Annual Wellness Visit (AWV). Left message for patient to call back and schedule Medicare Annual Wellness Visit (AWV).  Last date of AWV: awvi 07/12/21 per [facility]   Please schedule an appointment at any time with [facility].  If any questions, please contact me at [phone].  Thank you ,  Barkley Boards AWV direct phone # [phone]

## 2022-11-18 ENCOUNTER — Telehealth: Payer: Self-pay | Admitting: Nurse Practitioner

## 2022-11-18 NOTE — Telephone Encounter (Signed)
Contacted Dana Finley to schedule their annual wellness visit. Appointment made for 11/18/22.  [name] AWV direct phone # [phone]

## 2022-11-19 ENCOUNTER — Ambulatory Visit (INDEPENDENT_AMBULATORY_CARE_PROVIDER_SITE_OTHER): Payer: Medicare Other

## 2022-11-19 VITALS — Ht 67.0 in | Wt 161.0 lb

## 2022-11-19 DIAGNOSIS — Z0000 Encounter for general adult medical examination without abnormal findings: Secondary | ICD-10-CM | POA: Diagnosis not present

## 2022-11-19 NOTE — Patient Instructions (Signed)
Dana Finley , Thank you for taking time to come for your Medicare Wellness Visit. I appreciate your ongoing commitment to your health goals. Please review the following plan we discussed and let me know if I can assist you in the future.   These are the goals we discussed:  Goals       Remain active and independent      Weight (lb) < 200 lb (90.7 kg) (pt-stated)      Loose 25lbs        This is a list of the screening recommended for you and due dates:  Health Maintenance  Topic Date Due   Colon Cancer Screening  06/25/2017   Flu Shot  03/13/2023   Medicare Annual Wellness Visit  11/19/2023   Mammogram  12/15/2023   DTaP/Tdap/Td vaccine (2 - Td or Tdap) 02/27/2032   Pneumonia Vaccine  Completed   DEXA scan (bone density measurement)  Completed   COVID-19 Vaccine  Completed   Zoster (Shingles) Vaccine  Completed   HPV Vaccine  Aged Out   Hepatitis C Screening: USPSTF Recommendation to screen - Ages 88-79 yo.  Discontinued    Advanced directives: Forms are available if you choose in the future to pursue completion.  This is recommended in order to make sure that your health wishes are honored in the event that you are unable to verbalize them to the provider.    Conditions/risks identified: Aim for 30 minutes of exercise or brisk walking, 6-8 glasses of water, and 5 servings of fruits and vegetables each day.   Next appointment: Follow up in one year for your annual wellness visit    Preventive Care 65 Years and Older, Female Preventive care refers to lifestyle choices and visits with your health care provider that can promote health and wellness. What does preventive care include? A yearly physical exam. This is also called an annual well check. Dental exams once or twice a year. Routine eye exams. Ask your health care provider how often you should have your eyes checked. Personal lifestyle choices, including: Daily care of your teeth and gums. Regular physical  activity. Eating a healthy diet. Avoiding tobacco and drug use. Limiting alcohol use. Practicing safe sex. Taking low-dose aspirin every day. Taking vitamin and mineral supplements as recommended by your health care provider. What happens during an annual well check? The services and screenings done by your health care provider during your annual well check will depend on your age, overall health, lifestyle risk factors, and family history of disease. Counseling  Your health care provider may ask you questions about your: Alcohol use. Tobacco use. Drug use. Emotional well-being. Home and relationship well-being. Sexual activity. Eating habits. History of falls. Memory and ability to understand (cognition). Work and work Astronomer. Reproductive health. Screening  You may have the following tests or measurements: Height, weight, and BMI. Blood pressure. Lipid and cholesterol levels. These may be checked every 5 years, or more frequently if you are over 66 years old. Skin check. Lung cancer screening. You may have this screening every year starting at age 3 if you have a 30-pack-year history of smoking and currently smoke or have quit within the past 15 years. Fecal occult blood test (FOBT) of the stool. You may have this test every year starting at age 60. Flexible sigmoidoscopy or colonoscopy. You may have a sigmoidoscopy every 5 years or a colonoscopy every 10 years starting at age 7. Hepatitis C blood test. Hepatitis B blood test. Sexually  transmitted disease (STD) testing. Diabetes screening. This is done by checking your blood sugar (glucose) after you have not eaten for a while (fasting). You may have this done every 1-3 years. Bone density scan. This is done to screen for osteoporosis. You may have this done starting at age 76. Mammogram. This may be done every 1-2 years. Talk to your health care provider about how often you should have regular mammograms. Talk with your  health care provider about your test results, treatment options, and if necessary, the need for more tests. Vaccines  Your health care provider may recommend certain vaccines, such as: Influenza vaccine. This is recommended every year. Tetanus, diphtheria, and acellular pertussis (Tdap, Td) vaccine. You may need a Td booster every 10 years. Zoster vaccine. You may need this after age 40. Pneumococcal 13-valent conjugate (PCV13) vaccine. One dose is recommended after age 68. Pneumococcal polysaccharide (PPSV23) vaccine. One dose is recommended after age 63. Talk to your health care provider about which screenings and vaccines you need and how often you need them. This information is not intended to replace advice given to you by your health care provider. Make sure you discuss any questions you have with your health care provider. Document Released: 08/25/2015 Document Revised: 04/17/2016 Document Reviewed: 05/30/2015 Elsevier Interactive Patient Education  2017 [employer] Prevention in the Home Falls can cause injuries. They can happen to people of all ages. There are many things you can do to make your home safe and to help prevent falls. What can I do on the outside of my home? Regularly fix the edges of walkways and driveways and fix any cracks. Remove anything that might make you trip as you walk through a door, such as a raised step or threshold. Trim any bushes or trees on the path to your home. Use bright outdoor lighting. Clear any walking paths of anything that might make someone trip, such as rocks or tools. Regularly check to see if handrails are loose or broken. Make sure that both sides of any steps have handrails. Any raised decks and porches should have guardrails on the edges. Have any leaves, snow, or ice cleared regularly. Use sand or salt on walking paths during winter. Clean up any spills in your garage right away. This includes oil or grease spills. What can I  do in the bathroom? Use night lights. Install grab bars by the toilet and in the tub and shower. Do not use towel bars as grab bars. Use non-skid mats or decals in the tub or shower. If you need to sit down in the shower, use a plastic, non-slip stool. Keep the floor dry. Clean up any water that spills on the floor as soon as it happens. Remove soap buildup in the tub or shower regularly. Attach bath mats securely with double-sided non-slip rug tape. Do not have throw rugs and other things on the floor that can make you trip. What can I do in the bedroom? Use night lights. Make sure that you have a light by your bed that is easy to reach. Do not use any sheets or blankets that are too big for your bed. They should not hang down onto the floor. Have a firm chair that has side arms. You can use this for support while you get dressed. Do not have throw rugs and other things on the floor that can make you trip. What can I do in the kitchen? Clean up any spills right away. Avoid  walking on wet floors. Keep items that you use a lot in easy-to-reach places. If you need to reach something above you, use a strong step stool that has a grab bar. Keep electrical cords out of the way. Do not use floor polish or wax that makes floors slippery. If you must use wax, use non-skid floor wax. Do not have throw rugs and other things on the floor that can make you trip. What can I do with my stairs? Do not leave any items on the stairs. Make sure that there are handrails on both sides of the stairs and use them. Fix handrails that are broken or loose. Make sure that handrails are as long as the stairways. Check any carpeting to make sure that it is firmly attached to the stairs. Fix any carpet that is loose or worn. Avoid having throw rugs at the top or bottom of the stairs. If you do have throw rugs, attach them to the floor with carpet tape. Make sure that you have a light switch at the top of the stairs  and the bottom of the stairs. If you do not have them, ask someone to add them for you. What else can I do to help prevent falls? Wear shoes that: Do not have high heels. Have rubber bottoms. Are comfortable and fit you well. Are closed at the toe. Do not wear sandals. If you use a stepladder: Make sure that it is fully opened. Do not climb a closed stepladder. Make sure that both sides of the stepladder are locked into place. Ask someone to hold it for you, if possible. Clearly mark and make sure that you can see: Any grab bars or handrails. First and last steps. Where the edge of each step is. Use tools that help you move around (mobility aids) if they are needed. These include: Canes. Walkers. Scooters. Crutches. Turn on the lights when you go into a dark area. Replace any light bulbs as soon as they burn out. Set up your furniture so you have a clear path. Avoid moving your furniture around. If any of your floors are uneven, fix them. If there are any pets around you, be aware of where they are. Review your medicines with your doctor. Some medicines can make you feel dizzy. This can increase your chance of falling. Ask your doctor what other things that you can do to help prevent falls. This information is not intended to replace advice given to you by your health care provider. Make sure you discuss any questions you have with your health care provider. Document Released: 05/25/2009 Document Revised: 01/04/2016 Document Reviewed: 09/02/2014 Elsevier Interactive Patient Education  2017 [employer].

## 2022-11-19 NOTE — Progress Notes (Signed)
Subjective:   Dana Finley is a 67 y.o. female who presents for an Initial Medicare Annual Wellness Visit.  I connected with  Leron Croak on 11/19/22 by a audio enabled telemedicine application and verified that I am speaking with the correct person using two identifiers.  Patient Location: Home  Provider Location: Office/Clinic  I discussed the limitations of evaluation and management by telemedicine. The patient expressed understanding and agreed to proceed.  Review of Systems     Cardiac Risk Factors include: hypertension     Objective:    Today's Vitals   11/19/22 1556  Weight: 161 lb (73 kg)  Height: 5\' 7"  (1.702 m)   Body mass index is 25.22 kg/m.     02/13/2022    1:28 PM  Advanced Directives  Does Patient Have a Medical Advance Directive? Yes  Type of Estate agent of [place];Living will    Current Medications (verified) Outpatient Encounter Medications as of 11/19/2022  Medication Sig   amphetamine-dextroamphetamine (ADDERALL) 30 MG tablet Take 1 tablet by mouth 2 (two) times daily.   levothyroxine (SYNTHROID, LEVOTHROID) 25 MCG tablet Take 25 mcg by mouth daily.    lisinopril (ZESTRIL) 5 MG tablet Take 1 tablet (5 mg total) by mouth daily.   metoprolol tartrate (LOPRESSOR) 25 MG tablet Take 1 tablet (25 mg total) by mouth 2 (two) times daily.   metoprolol tartrate (LOPRESSOR) 25 MG tablet Take 12.5 mg by mouth 2 (two) times daily. Pt takes an extra 1/2 tablet 12.5 mg with heart rating episodes.   Multiple Vitamins-Minerals (PRESERVISION AREDS 2 PO) Take by mouth.   Polysacch Fe Cmp-Fe Heme Poly (BIFERA) 28 MG TABS Take 1 tablet by mouth daily at 6 (six) AM.   pravastatin (PRAVACHOL) 40 MG tablet Take 1 tablet (40 mg total) by mouth daily.   sertraline (ZOLOFT) 100 MG tablet Take 1.5 tablets (150 mg total) by mouth daily.   traZODone (DESYREL) 150 MG tablet Take 1-2 tablets (150-300 mg total) by mouth at bedtime as needed.    zolmitriptan (ZOMIG) 5 MG tablet Take 1 tablet (5 mg total) by mouth as needed for migraine.   [DISCONTINUED] RSV vaccine recomb adjuvanted (AREXVY) 120 MCG/0.5ML injection Inject into the muscle.   [DISCONTINUED] COVID-19 mRNA vaccine 2023-2024 (COMIRNATY) syringe Inject into the muscle.   No facility-administered encounter medications on file as of 11/19/2022.    Allergies (verified) Codeine, Lisinopril, and Morphine and related   History: Past Medical History:  Diagnosis Date   ACHILLES TENDINITIS 12/19/2009   Qualifier: Diagnosis of  By: Alphonzo Severance MD, Willie R    Acute upper respiratory infection 05/31/2016   ADD (attention deficit disorder)    Anxiety    Chalazion of right lower eyelid 10/08/2012   COLONIC POLYPS, HYPERPLASTIC 11/19/2005   Qualifier: Diagnosis of  By: Thereasa Solo     Depression    DISC DISEASE, LUMBOSACRAL SPINE 12/07/2009   Qualifier: Diagnosis of  By: Alphonzo Severance MD, Loni Dolly    Diverticula of colon    Hemorrhoids    History of colonic polyps hyperplastic   Hypertension    Hypothyroidism    IBS (irritable bowel syndrome)    Insomnia, persistent    Migraine headache without aura    lumbosacral disc desease   Osteopenia    OSTEOPENIA 06/30/2007   Qualifier: Diagnosis of  By: Alphonzo Severance MD, Loni Dolly    RHINITIS 12/27/2009   Qualifier: Diagnosis of  By: Alphonzo Severance MD, Loni Dolly  Serrated adenoma of colon 06/2012   Skin lesion 11/26/2021   Weight loss    WEIGHT LOSS 08/20/2007   Qualifier: Diagnosis of  By: Alphonzo Severance MD, Loni Dolly    Wellness examination 02/26/2022   Past Surgical History:  Procedure Laterality Date   ABDOMINAL HYSTERECTOMY     1996   APPENDECTOMY     CATARACT EXTRACTION     bilateral   COLONOSCOPY     EYE SURGERY     muscle surgery; has had approx 18 surgeries on both eyes   POLYPECTOMY     Family History  Problem Relation Age of Onset   Diabetes Mother    Heart disease Mother    Stroke Mother    Early death  Sister    Colon cancer Sister 69   Pancreatic cancer Father 7   Stomach cancer Maternal Uncle 51   Leukemia Brother    Colon cancer Cousin 50   Colon cancer Maternal Uncle 60   Colon polyps Son    Rectal cancer Neg Hx    Social History   Socioeconomic History   Marital status: Married    Spouse name: Not on file   Number of children: 2   Years of education: Not on file   Highest education level: Not on file  Occupational History   Occupation: home    Employer: UNEMPLOYED  Tobacco Use   Smoking status: Former    Types: Cigarettes    Quit date: 06/12/1977    Years since quitting: 45.4   Smokeless tobacco: Never  Substance and Sexual Activity   Alcohol use: Yes    Comment: occasional   Drug use: No   Sexual activity: Not on file  Other Topics Concern   Not on file  Social History Narrative   Not on file   Social Determinants of Health   Financial Resource Strain: Low Risk  (11/19/2022)   Overall Financial Resource Strain (CARDIA)    Difficulty of Paying Living Expenses: Not hard at all  Food Insecurity: No Food Insecurity (11/19/2022)   Hunger Vital Sign    Worried About Running Out of Food in the Last Year: Never true    Ran Out of Food in the Last Year: Never true  Transportation Needs: No Transportation Needs (11/19/2022)   PRAPARE - Administrator, Civil Service (Medical): No    Lack of Transportation (Non-Medical): No  Physical Activity: Insufficiently Active (11/19/2022)   Exercise Vital Sign    Days of Exercise per Week: 4 days    Minutes of Exercise per Session: 30 min  Stress: No Stress Concern Present (11/19/2022)   [employer] of Occupational Health - Occupational Stress Questionnaire    Feeling of Stress : Not at all  Social Connections: Moderately Integrated (11/19/2022)   Social Connection and Isolation Panel [NHANES]    Frequency of Communication with Friends and Family: More than three times a week    Frequency of Social Gatherings with  Friends and Family: Three times a week    Attends Religious Services: 1 to 4 times per year    Active Member of Clubs or Organizations: No    Attends Banker Meetings: Never    Marital Status: Married    Tobacco Counseling Counseling given: Not Answered   Clinical Intake:  Pre-visit preparation completed: Yes  Pain : No/denies pain  Diabetes: No  How often do you need to have someone help you when you read instructions, pamphlets, or other  written materials from your doctor or pharmacy?: 1 - Never  Diabetic?No   Interpreter Needed?: No  Information entered by :: Kandis Fantasia LPN   Activities of Daily Living    11/19/2022    4:22 PM 02/26/2022   11:14 AM  In your present state of health, do you have any difficulty performing the following activities:  Hearing? 0 0  Vision? 0 0  Difficulty concentrating or making decisions? 0 0  Walking or climbing stairs? 0 0  Dressing or bathing? 0 0  Doing errands, shopping? 0 0  Preparing Food and eating ? N   Using the Toilet? N   In the past six months, have you accidently leaked urine? N   Do you have problems with loss of bowel control? N   Managing your Medications? N   Managing your Finances? N   Housekeeping or managing your Housekeeping? N     Patient Care Team: Early, Sung Amabile, NP as PCP - General (Nurse Practitioner) Meriam Sprague, MD as PCP - Cardiology (Cardiology) Susy Manor, PT as Physical Therapist (Physical Therapy)  Indicate any recent Medical Services you may have received from other than [facility] providers in the past year (date may be approximate).     Assessment:   This is a routine wellness examination for Dana Finley.  Hearing/Vision screen Hearing Screening - Comments:: Denies hearing difficulties  Vision Screening - Comments:: No vision problems; will schedule routine eye exam soon    Dietary issues and exercise activities discussed: Current Exercise Habits: Home exercise  routine, Type of exercise: walking, Time (Minutes): 30, Frequency (Times/Week): 4, Weekly Exercise (Minutes/Week): 120, Intensity: Mild   Goals Addressed             This Visit's Progress    Remain active and independent        Depression Screen    11/19/2022    4:21 PM 02/26/2022   11:13 AM 01/09/2022   10:02 AM 12/18/2021    1:31 PM 11/26/2021    1:11 PM  PHQ 2/9 Scores  PHQ - 2 Score 0 0 0 0 0  PHQ- 9 Score  0     Exception Documentation  Medical reason Medical reason Medical reason     Fall Risk    11/19/2022    4:22 PM 06/06/2022   11:46 AM 02/26/2022   11:16 AM 02/26/2022   11:14 AM 01/09/2022   10:02 AM  Fall Risk   Falls in the past year? 0 0 1 0 0  Number falls in past yr: 0 0 1 0 0  Injury with Fall? 0 0 1 0 0  Risk for fall due to : No Fall Risks No Fall Risks History of fall(s) No Fall Risks No Fall Risks  Follow up Falls prevention discussed;Education provided;Falls evaluation completed Falls evaluation completed Falls evaluation completed Falls evaluation completed Falls evaluation completed    FALL RISK PREVENTION PERTAINING TO THE HOME:  Any stairs in or around the home? Yes  If so, are there any without handrails? No  Home free of loose throw rugs in walkways, pet beds, electrical cords, etc? Yes  Adequate lighting in your home to reduce risk of falls? Yes   ASSISTIVE DEVICES UTILIZED TO PREVENT FALLS:  Life alert? No  Use of a cane, walker or w/c? No  Grab bars in the bathroom? Yes  Shower chair or bench in shower? No  Elevated toilet seat or a handicapped toilet? Yes   TIMED UP AND  GO:  Was the test performed? No . Telephonic visit   Cognitive Function:        11/19/2022    4:23 PM 06/06/2022   11:47 AM  6CIT Screen  What Year? 0 points 0 points  What month? 0 points 0 points  What time? 0 points 0 points  Count back from 20 0 points 0 points  Months in reverse 0 points 0 points  Repeat phrase 0 points 0 points  Total Score 0 points 0  points    Immunizations Immunization History  Administered Date(s) Administered   COVID-19, mRNA, vaccine(Comirnaty)12 years and older 07/09/2022   Fluad Quad(high Dose 65+) 07/19/2021   Influenza-Unspecified 07/12/2022   PFIZER(Purple Top)SARS-COV-2 Vaccination 10/28/2019, 11/22/2019   PNEUMOCOCCAL CONJUGATE-20 02/26/2022   Pfizer Covid-19 Vaccine Bivalent Booster 4647yrs & up 07/27/2021   Respiratory Syncytial Virus Vaccine,Recomb Aduvanted(Arexvy) 07/25/2022   Tdap 02/26/2022    TDAP status: Up to date  Flu Vaccine status: Up to date  Pneumococcal vaccine status: Up to date  Covid-19 vaccine status: Information provided on how to obtain vaccines.   Qualifies for Shingles Vaccine? Yes   Zostavax completed No   Shingrix Completed?: Yes  Screening Tests Health Maintenance  Topic Date Due   COLONOSCOPY (Pts [phone] Insurance coverage will need to be confirmed)  06/25/2017   INFLUENZA VACCINE  03/13/2023   Medicare Annual Wellness (AWV)  11/19/2023   MAMMOGRAM  12/15/2023   DTaP/Tdap/Td (2 - Td or Tdap) 02/27/2032   Pneumonia Vaccine 5265+ Years old  Completed   DEXA SCAN  Completed   COVID-19 Vaccine  Completed   Zoster Vaccines- Shingrix  Completed   HPV VACCINES  Aged Out   Hepatitis C Screening  Discontinued    Health Maintenance  Health Maintenance Due  Topic Date Due   COLONOSCOPY (Pts [phone] Insurance coverage will need to be confirmed)  06/25/2017    Colorectal cancer screening: Type of screening: Colonoscopy. Completed 06/25/12. Repeat every 5 years  Mammogram status: Completed 12/14/21. Repeat every year  Bone Density status: Completed 05/21/22. Results reflect: Bone density results: OSTEOPENIA. Repeat every 2 years.  Lung Cancer Screening: (Low Dose CT Chest recommended if Age 29-80 years, 30 pack-year currently smoking OR have quit w/in 15years.) does not qualify.   Lung Cancer Screening Referral: n/a  Additional Screening:  Hepatitis C Screening:  does not qualify;  Vision Screening: Recommended annual ophthalmology exams for early detection of glaucoma and other disorders of the eye. Is the patient up to date with their annual eye exam?  Yes  Who is the provider or what is the name of the office in which the patient attends annual eye exams? Unable to provide  If pt is not established with a provider, would they like to be referred to a provider to establish care? No .   Dental Screening: Recommended annual dental exams for proper oral hygiene  Community Resource Referral / Chronic Care Management: CRR required this visit?  No   CCM required this visit?  No      Plan:     I have personally reviewed and noted the following in the patient's chart:   Medical and social history Use of alcohol, tobacco or illicit drugs  Current medications and supplements including opioid prescriptions. Patient is not currently taking opioid prescriptions. Functional ability and status Nutritional status Physical activity Advanced directives List of other physicians Hospitalizations, surgeries, and ER visits in previous 12 months Vitals Screenings to include cognitive, depression, and falls Referrals  and appointments  In addition, I have reviewed and discussed with patient certain preventive protocols, quality metrics, and best practice recommendations. A written personalized care plan for preventive services as well as general preventive health recommendations were provided to patient.     Durwin Nora, [place]   08/17/1094   Due to this being a virtual visit, the after visit summary with patients personalized plan was offered to patient via mail or my-chart. Patient would like to access on my-chart  Nurse Notes: No concerns

## 2022-11-21 ENCOUNTER — Ambulatory Visit (INDEPENDENT_AMBULATORY_CARE_PROVIDER_SITE_OTHER): Payer: Medicare Other | Admitting: Nurse Practitioner

## 2022-11-21 ENCOUNTER — Encounter: Payer: Self-pay | Admitting: Nurse Practitioner

## 2022-11-21 VITALS — BP 132/82 | HR 71 | Wt 161.2 lb

## 2022-11-21 DIAGNOSIS — R29898 Other symptoms and signs involving the musculoskeletal system: Secondary | ICD-10-CM | POA: Diagnosis not present

## 2022-11-21 DIAGNOSIS — R002 Palpitations: Secondary | ICD-10-CM

## 2022-11-21 DIAGNOSIS — R233 Spontaneous ecchymoses: Secondary | ICD-10-CM

## 2022-11-21 DIAGNOSIS — R7989 Other specified abnormal findings of blood chemistry: Secondary | ICD-10-CM | POA: Diagnosis not present

## 2022-11-21 DIAGNOSIS — R748 Abnormal levels of other serum enzymes: Secondary | ICD-10-CM

## 2022-11-21 DIAGNOSIS — N1831 Chronic kidney disease, stage 3a: Secondary | ICD-10-CM

## 2022-11-21 DIAGNOSIS — I10 Essential (primary) hypertension: Secondary | ICD-10-CM

## 2022-11-21 LAB — PATHOLOGIST SMEAR REVIEW
Eos: 4 %
Hematocrit: 41.1 % (ref 34.0–46.6)
Lymphocytes Absolute: 2.3 10*3/uL (ref 0.7–3.1)
MCHC: 33.3 g/dL (ref 31.5–35.7)
WBC: 6.4 10*3/uL (ref 3.4–10.8)

## 2022-11-21 NOTE — Progress Notes (Signed)
Dana Clamp, DNP, AGNP-c [facility]  [address] [place], [place] [address] [phone]  ESTABLISHED PATIENT- Chronic Health and/or Follow-Up Visit  Blood pressure 132/82, pulse 71, weight 161 lb 3.2 oz (73.1 kg).    Dana Finley is a 67 y.o. year old female presenting today for evaluation and management of chronic conditions and labs.   Dana Finley presents today with chief complaints of occasional heart flutter, which has been managed by taking half of the prescribed medication as advised by Dr. Shari Prows. She denies any recent episodes of fainting or dizziness.  The patient reports a high vitamin B12 level detected during a recent physical examination. Dana Finley is interested in retesting her vitamin B12 levels to determine if the initial result was an artifact or if there is an underlying issue. Dana Finley mentions that her brother, who is five years older, has a red blood cell production issue (recently diagnosed with MDS) and is receiving bi-weekly transfusions. He has a prognosis of four years to live and has had a long-term history of some type of leukemia. His diagnosis prompted concern for her own blood counts.   The patient reports a history of easy bruising, but her husband does not believe it to be excessive. She recalls an instance where she experienced increased bruising while taking an NSAID, which resolved after discontinuing the medication.  All ROS negative with exception of what is listed above.   PHYSICAL EXAM Physical Exam Vitals and nursing note reviewed.  Constitutional:      General: She is not in acute distress.    Appearance: Normal appearance.  HENT:     Head: Normocephalic.  Eyes:     Extraocular Movements: Extraocular movements intact.     Conjunctiva/sclera: Conjunctivae normal.     Pupils: Pupils are equal, round, and reactive to light.  Neck:     Vascular: No carotid bruit.  Cardiovascular:     Rate and Rhythm: Normal rate and regular  rhythm.     Pulses: Normal pulses.     Heart sounds: Normal heart sounds. No murmur heard. Pulmonary:     Effort: Pulmonary effort is normal.     Breath sounds: Normal breath sounds. No wheezing.  Abdominal:     General: Bowel sounds are normal. There is no distension.     Palpations: Abdomen is soft.     Tenderness: There is no abdominal tenderness. There is no guarding.  Musculoskeletal:        General: Normal range of motion.     Cervical back: Normal range of motion and neck supple. No tenderness.     Right lower leg: No edema.     Left lower leg: No edema.  Lymphadenopathy:     Cervical: No cervical adenopathy.  Skin:    General: Skin is warm and dry.     Capillary Refill: Capillary refill takes less than 2 seconds.     Coloration: Skin is not jaundiced or pale.     Findings: No bruising.  Neurological:     General: No focal deficit present.     Mental Status: She is alert and oriented to person, place, and time.  Psychiatric:        Mood and Affect: Mood normal.     PLAN Problem List Items Addressed This Visit     Essential hypertension, benign    Kahmari's blood pressure is slightly elevated today. Goal BP 130/80 or less. Recommend monitoring at home closely and if it is consistently staying high, let  me know.  Plan: - monitor BP at home a few times a week and let me know if it stays higher than 130/80.      Chronic kidney disease    No alarm symptoms are present at this time. Last labs showed some improvement in GFR. We will repeat labs today for monitoring.  Plan: - Avoid medications that can be hard on the kidneys such as NSAIDs (ibuprofen, advil, aleve).  - Stay well hydrated with water - Keep your blood pressure under good control.       Relevant Orders   Vitamin B12 (Completed)   CBC with Differential/Platelet (Completed)   Comprehensive metabolic panel (Completed)   Alkaline phosphatase, isoenzymes (Completed)   Pathologist smear review (Completed)    Leg heaviness    Chronic. Labs pending.       Relevant Orders   Vitamin B12 (Completed)   CBC with Differential/Platelet (Completed)   Comprehensive metabolic panel (Completed)   Alkaline phosphatase, isoenzymes (Completed)   Pathologist smear review (Completed)   High serum vitamin B12 - Primary    Previous labs show elevation in B12 levels with no known etiology. She has no alarm symptoms present at this time. Recent diagnosis of MDS in her brother has, naturally, caused alarm with this finding. We will recheck the levels and add a peripheral smear. If elevation remains, I recommend that we seek hematology evaluation for further examination and recommendations.  Plan: - recheck B12 today - if still elevated, will send a referral to hematology      Relevant Orders   Vitamin B12 (Completed)   CBC with Differential/Platelet (Completed)   Comprehensive metabolic panel (Completed)   Alkaline phosphatase, isoenzymes (Completed)   Pathologist smear review (Completed)   Easy bruising    Reported easy bruising or bruising with no recognizable impact. At this time, no significant bruising is identifiable on exam.  Plan: - Monitor your bruising patterns and note if this seems to increase in severity.  - we will monitor your labs today.       Relevant Orders   Vitamin B12 (Completed)   CBC with Differential/Platelet (Completed)   Comprehensive metabolic panel (Completed)   Alkaline phosphatase, isoenzymes (Completed)   Pathologist smear review (Completed)   Elevated alkaline phosphatase level    Recent labs showed slight elevation. Vitamin D was WNL. We will recheck today and add differentiation to examine the possible cause.  Plan: -labs pending      Relevant Orders   Vitamin B12 (Completed)   CBC with Differential/Platelet (Completed)   Comprehensive metabolic panel (Completed)   Alkaline phosphatase, isoenzymes (Completed)   Pathologist smear review (Completed)   Intermittent  palpitations    Currently managed with metoprolol. She is not having any alarm symptoms. Her recent thyroid levels were normal. We will recheck other labs today to ensure no causative factor present. HRRR today.  Plan: - continue with metoprolol as directed by cardiology.  - notify immediately if these seem to worsen or change.        No follow-ups on file.   Dana Clamp, DNP, AGNP-c 11/21/2022 10:37 AM

## 2022-11-22 LAB — PATHOLOGIST SMEAR REVIEW
Hemoglobin: 13.7 g/dL (ref 11.1–15.9)
Neutrophils Absolute: 3.2 10*3/uL (ref 1.4–7.0)
Neutrophils: 49 %

## 2022-11-25 LAB — PATHOLOGIST SMEAR REVIEW
Basophils Absolute: 0 10*3/uL (ref 0.0–0.2)
Basos: 1 %
EOS (ABSOLUTE): 0.3 10*3/uL (ref 0.0–0.4)
Immature Grans (Abs): 0 10*3/uL (ref 0.0–0.1)
Immature Granulocytes: 0 %
Lymphs: 37 %
MCH: 31.2 pg (ref 26.6–33.0)
MCV: 94 fL (ref 79–97)
Monocytes Absolute: 0.6 10*3/uL (ref 0.1–0.9)
Monocytes: 9 %
Platelets: 257 10*3/uL (ref 150–450)
RBC: 4.39 x10E6/uL (ref 3.77–5.28)
RDW: 12.8 % (ref 11.7–15.4)

## 2022-11-27 DIAGNOSIS — R7989 Other specified abnormal findings of blood chemistry: Secondary | ICD-10-CM | POA: Insufficient documentation

## 2022-11-27 DIAGNOSIS — R748 Abnormal levels of other serum enzymes: Secondary | ICD-10-CM

## 2022-11-27 DIAGNOSIS — R002 Palpitations: Secondary | ICD-10-CM | POA: Insufficient documentation

## 2022-11-27 DIAGNOSIS — R233 Spontaneous ecchymoses: Secondary | ICD-10-CM | POA: Insufficient documentation

## 2022-11-27 HISTORY — DX: Other specified abnormal findings of blood chemistry: R79.89

## 2022-11-27 HISTORY — DX: Abnormal levels of other serum enzymes: R74.8

## 2022-11-27 HISTORY — DX: Spontaneous ecchymoses: R23.3

## 2022-11-27 NOTE — Assessment & Plan Note (Signed)
Reported easy bruising or bruising with no recognizable impact. At this time, no significant bruising is identifiable on exam.  Plan: - Monitor your bruising patterns and note if this seems to increase in severity.  - we will monitor your labs today.

## 2022-11-27 NOTE — Assessment & Plan Note (Signed)
Previous labs show elevation in B12 levels with no known etiology. She has no alarm symptoms present at this time. Recent diagnosis of MDS in her brother has, naturally, caused alarm with this finding. We will recheck the levels and add a peripheral smear. If elevation remains, I recommend that we seek hematology evaluation for further examination and recommendations.  Plan: - recheck B12 today - if still elevated, will send a referral to hematology

## 2022-11-27 NOTE — Assessment & Plan Note (Signed)
Chronic.  Labs pending. 

## 2022-11-27 NOTE — Assessment & Plan Note (Addendum)
Currently managed with metoprolol. She is not having any alarm symptoms. Her recent thyroid levels were normal. We will recheck other labs today to ensure no causative factor present. HRRR today.  Plan: - continue with metoprolol as directed by cardiology.  - notify immediately if these seem to worsen or change.

## 2022-11-27 NOTE — Assessment & Plan Note (Signed)
Dana Finley's blood pressure is slightly elevated today. Goal BP 130/80 or less. Recommend monitoring at home closely and if it is consistently staying high, let me know.  Plan: - monitor BP at home a few times a week and let me know if it stays higher than 130/80.

## 2022-11-27 NOTE — Assessment & Plan Note (Signed)
>>  ASSESSMENT AND PLAN FOR CHRONIC KIDNEY DISEASE WRITTEN ON 11/27/2022  4:25 PM BY Jassmine Vandruff E, NP  No alarm symptoms are present at this time. Last labs showed some improvement in GFR. We will repeat labs today for monitoring.  Plan: - Avoid medications that can be hard on the kidneys such as NSAIDs (ibuprofen, advil, aleve).  - Stay well hydrated with water - Keep your blood pressure under good control.

## 2022-11-27 NOTE — Assessment & Plan Note (Signed)
Recent labs showed slight elevation. Vitamin D was WNL. We will recheck today and add differentiation to examine the possible cause.  Plan: -labs pending

## 2022-11-27 NOTE — Assessment & Plan Note (Signed)
No alarm symptoms are present at this time. Last labs showed some improvement in GFR. We will repeat labs today for monitoring.  Plan: - Avoid medications that can be hard on the kidneys such as NSAIDs (ibuprofen, advil, aleve).  - Stay well hydrated with water - Keep your blood pressure under good control.

## 2022-11-28 LAB — ALKALINE PHOSPHATASE, ISOENZYMES
BONE FRACTION: 51 % (ref 14–68)
INTESTINAL FRAC.: 9 % (ref 0–18)
LIVER FRACTION: 40 % (ref 18–85)

## 2022-11-28 LAB — COMPREHENSIVE METABOLIC PANEL
ALT: 9 IU/L (ref 0–32)
AST: 18 IU/L (ref 0–40)
Albumin/Globulin Ratio: 2.2 (ref 1.2–2.2)
Albumin: 4.4 g/dL (ref 3.9–4.9)
Alkaline Phosphatase: 52 IU/L (ref 44–121)
BUN/Creatinine Ratio: 20 (ref 12–28)
BUN: 21 mg/dL (ref 8–27)
Bilirubin Total: 0.4 mg/dL (ref 0.0–1.2)
CO2: 24 mmol/L (ref 20–29)
Calcium: 9.4 mg/dL (ref 8.7–10.3)
Chloride: 101 mmol/L (ref 96–106)
Creatinine, Ser: 1.03 mg/dL — ABNORMAL HIGH (ref 0.57–1.00)
Globulin, Total: 2 g/dL (ref 1.5–4.5)
Glucose: 95 mg/dL (ref 70–99)
Potassium: 4.5 mmol/L (ref 3.5–5.2)
Sodium: 139 mmol/L (ref 134–144)
Total Protein: 6.4 g/dL (ref 6.0–8.5)
eGFR: 60 mL/min/{1.73_m2} (ref >59)

## 2022-11-28 LAB — CBC WITH DIFFERENTIAL/PLATELET
Basophils Absolute: 0 10*3/uL (ref 0.0–0.2)
Basos: 1 %
EOS (ABSOLUTE): 0.2 10*3/uL (ref 0.0–0.4)
Eos: 3 %
Hematocrit: 41 % (ref 34.0–46.6)
Hemoglobin: 13.7 g/dL (ref 11.1–15.9)
Immature Grans (Abs): 0 10*3/uL (ref 0.0–0.1)
Immature Granulocytes: 0 %
Lymphocytes Absolute: 2.4 10*3/uL (ref 0.7–3.1)
Lymphs: 38 %
MCH: 31.2 pg (ref 26.6–33.0)
MCHC: 33.4 g/dL (ref 31.5–35.7)
MCV: 93 fL (ref 79–97)
Monocytes Absolute: 0.5 10*3/uL (ref 0.1–0.9)
Monocytes: 8 %
Neutrophils Absolute: 3.2 10*3/uL (ref 1.4–7.0)
Neutrophils: 50 %
Platelets: 256 10*3/uL (ref 150–450)
RBC: 4.39 x10E6/uL (ref 3.77–5.28)
RDW: 12.9 % (ref 11.7–15.4)
WBC: 6.4 10*3/uL (ref 3.4–10.8)

## 2022-11-28 LAB — VITAMIN B12: Vitamin B-12: 528 pg/mL (ref 232–1245)

## 2023-01-07 NOTE — Progress Notes (Unsigned)
Office Visit    Patient Name: Dana Finley Date of Encounter: 01/08/2023  PCP:  Tollie Eth, NP   [facility]  Cardiologist:  Meriam Sprague, MD  Advanced Practice Provider:  No care team member to display Electrophysiologist:  None   HPI    Dana Finley is a 67 y.o. female with a past medical history of anxiety, hypertension, depression, and migraines presents today for follow-up appointment.  She was seen by Dr. Signe Colt in clinic 02/27/2022 where she was having dizzy spells, lightheadedness, and presyncope.  Her HCTZ was stopped she was referred to cardiology for further workup.  She was seen in the clinic 03/2022 which is having continued dizziness spells.  Reported that she was havingseveral episodes of syncope over the past 6 months.  1 episode was 10/2021 when she was standing on a stool and suddenly lost consciousness and hit her head and required stitches.  Went to the ER at that time for further workup which was unremarkable.  Had another episode 12/2021 where she stood to get out of her car after driving for several hours and fell to the floor.  Cardiac monitor 04/2022 with normal sinus rhythm with 12 runs of nonsustained SVT, rare SVE/VE.  TTE 04/2022 with LVEF 50 to 55%, G1 DD, normal RV, no significant valve disease.  Myoview 04/2022 with normal perfusion, EF 66%.  She was seen by Dr. Shari Prows 07/09/2022.  She stated that she was still feeling dizzy regardless of standing or laying down.  Dizziness was not necessarily positional.  She confirms that she could begin feeling dizzy while sitting.  In fact, this did occur when she was sitting on the exam table.  When it occurs her dizziness may be severe and she states she feels like she might faint.  She is associated flushed and clammy feelings.  Also during the episode her diastolic blood pressure tends to be closer to 56-60.  Otherwise her blood pressure seems stable.  Continue to notice palpitations and  episodes where her heart rate will go up to 110 to 115 bpm with sitting.  Of note after starting metoprolol her episodes seem to improve temporarily.  Believe that her body was becoming accustomed to the metoprolol dose.  Additionally having persistent lower extremity heaviness that she correlated with exertion such as going up stairs.  She was seen by me 10/09/2022, she tells me she has been getting better since her metoprolol was increased to 25 mg twice daily.  Her dose of lisinopril was decreased to give her a little more blood pressure room.  Blood pressure stable today.  No dizziness.  She did have some chest pain while laying in bed that lasted for about 5 minutes followed by some faster heartbeats that lasted around 30 minutes to an hour.  She called our office and was told to take an extra half of her metoprolol if this occurs again.  That incident did not occur again however, she did have an episode of sharp fleeting chest pain which happened when she was standing.  This is very atypical for cardiac pain.  Nonexertional.  She is worried because her mother has a history of heart attacks.  We reviewed her stress test from back in September which was negative.  She also shares that after the first episode of the chest pain and palpitations her blood pressure was high 154/78 with heart rate 56.  She has had variable blood pressures in the past  however, today it is been well-controlled and per patient report has been well-controlled at home with recent medication changes.  She drinks 1 cup of coffee in the morning and does not drink alcohol.  We discussed switching to decaf and also increasing hydration to 64 ounces daily.  Today, she states that she occasionally has some dizziness which lasts about 2 seconds.  This happens when bending down or unloading the dishwasher.  This does not happen often.  Overall, she is doing much better.  Her brother does have some issues with blood cancer.  They did some lab  work on the patient and unfortunately her B12 level was very high.  Fortunately, it was a lab error and more recent lab work reveals normal B12 levels.  Otherwise, no shortness of breath or chest pain.  No palpitations.  She has not needed to take her as needed metoprolol.  Compliant with medications.   Reports no shortness of breath nor dyspnea on exertion. Reports no chest pain, pressure, or tightness. No edema, orthopnea, PND. Reports no palpitations.   Past Medical History    Past Medical History:  Diagnosis Date   ACHILLES TENDINITIS 12/19/2009   Qualifier: Diagnosis of  By: Alphonzo Severance MD, Loni Dolly    Acute upper respiratory infection 05/31/2016   ADD (attention deficit disorder)    Anxiety    Chalazion of right lower eyelid 10/08/2012   COLONIC POLYPS, HYPERPLASTIC 11/19/2005   Qualifier: Diagnosis of  By: Thereasa Solo     Depression    DISC DISEASE, LUMBOSACRAL SPINE 12/07/2009   Qualifier: Diagnosis of  By: Alphonzo Severance MD, Loni Dolly    Diverticula of colon    Hemorrhoids    History of colonic polyps hyperplastic   Hypertension    Hypothyroidism    IBS (irritable bowel syndrome)    Insomnia, persistent    Migraine headache without aura    lumbosacral disc desease   Osteopenia    OSTEOPENIA 06/30/2007   Qualifier: Diagnosis of  By: Alphonzo Severance MD, Loni Dolly    RHINITIS 12/27/2009   Qualifier: Diagnosis of  By: Alphonzo Severance MD, Willie R    Serrated adenoma of colon 06/2012   Skin lesion 11/26/2021   Weight loss    WEIGHT LOSS 08/20/2007   Qualifier: Diagnosis of  By: Alphonzo Severance MD, Loni Dolly    Wellness examination 02/26/2022   Past Surgical History:  Procedure Laterality Date   ABDOMINAL HYSTERECTOMY     1996   APPENDECTOMY     CATARACT EXTRACTION     bilateral   COLONOSCOPY     EYE SURGERY     muscle surgery; has had approx 18 surgeries on both eyes   POLYPECTOMY      Allergies  Allergies  Allergen Reactions   Codeine Nausea And Vomiting   Lisinopril      Becomes very sleepy   Morphine And Codeine     Gi upset     EKGs/Labs/Other Studies Reviewed:   The following studies were reviewed today:   Myoview 04/2022:   The study is normal. The study is low risk.   No ST deviation was noted.   LV perfusion is abnormal. There is a small defect with moderate reduction in uptake present in the apex location(s) that is fixed. The defect appears worse at rest than with stress and there is normal wall motion in the defect area. Consistent with artifact caused by subdiaphragmatic activity.   Left ventricular function is normal. Nuclear  stress EF: 66 %. The left ventricular ejection fraction is hyperdynamic (>65%). End diastolic cavity size is normal.   Prior study not available for comparison.   TTE 04/2022: IMPRESSIONS    1. Left ventricular ejection fraction, by estimation, is 50 to 55%. Left  ventricular ejection fraction by 3D volume is 54 %. The left ventricle has  low normal function. The left ventricle has no regional wall motion  abnormalities. Left ventricular  diastolic parameters are consistent with Grade I diastolic dysfunction  (impaired relaxation).   2. Right ventricular systolic function is normal. The right ventricular  size is normal.   3. The mitral valve is normal in structure. No evidence of mitral valve  regurgitation. No evidence of mitral stenosis.   4. The aortic valve is tricuspid. Aortic valve regurgitation is not  visualized. No aortic stenosis is present.   5. The inferior vena cava is normal in size with greater than 50%  respiratory variability, suggesting right atrial pressure of 3 mmHg.    Cardiac Monitor 04/2022:   Patch wear time was 6 days and 22 hours   Predominant rhythm was NSR with average HR 69bpm   There were 12 runs of nonsustained SVT with longest lasting 13 beats   Rare SVE (<1%), rare VE (<1%)   Patient triggered events correlate with NSR, sinus tach, SVE and PVCs   No sustained arrhythmias  or significant pauses     Patch Wear Time:  6 days and 22 hours (2023-08-19T13:36:04-399 to 2023-08-26T11:57:11-398)   Patient had a min HR of 46 bpm, max HR of 174 bpm, and avg HR of 69 bpm. Predominant underlying rhythm was Sinus Rhythm. 12 Supraventricular Tachycardia runs occurred, the run with the fastest interval lasting 12 beats with a max rate of 174 bpm, the  longest lasting 13 beats with an avg rate of 133 bpm. Isolated SVEs were rare (<1.0%), SVE Couplets were rare (<1.0%), and SVE Triplets were rare (<1.0%). Isolated VEs were rare (<1.0%), VE Couplets were rare (<1.0%), and no VE Triplets were present.    Carotid ultrasound 03/2022: IMPRESSION: Color duplex indicates minimal heterogeneous plaque, with no hemodynamically significant stenosis by duplex criteria in the extracranial cerebrovascular circulation.  EKG:  EKG is not ordered today.    Recent Labs: 05/29/2022: TSH 1.800 11/21/2022: ALT 9; BUN 21; Creatinine, Ser 1.03; Hemoglobin 13.7; Hemoglobin 13.7; Platelets 256; Platelets 257; Potassium 4.5; Sodium 139  Recent Lipid Panel    Component Value Date/Time   CHOL 175 10/22/2022 1041   TRIG 103 10/22/2022 1041   HDL 69 10/22/2022 1041   CHOLHDL 2.5 10/22/2022 1041   CHOLHDL 3 10/09/2010 0847   VLDL 18.6 10/09/2010 0847   LDLCALC 88 10/22/2022 1041   LDLDIRECT 131.0 10/09/2010 0847     Home Medications   Current Meds  Medication Sig   amphetamine-dextroamphetamine (ADDERALL) 30 MG tablet Take 1 tablet by mouth 2 (two) times daily.   levothyroxine (SYNTHROID, LEVOTHROID) 25 MCG tablet Take 25 mcg by mouth daily.    lisinopril (ZESTRIL) 5 MG tablet Take 1 tablet (5 mg total) by mouth daily.   metoprolol tartrate (LOPRESSOR) 25 MG tablet Take 1 tablet (25 mg total) by mouth 2 (two) times daily.   metoprolol tartrate (LOPRESSOR) 25 MG tablet Take 12.5 mg by mouth 2 (two) times daily. Pt takes an extra 1/2 tablet 12.5 mg with heart rating episodes.   Multiple  Vitamins-Minerals (PRESERVISION AREDS 2 PO) Take by mouth.   Polysacch Fe Cmp-Fe Heme Poly (  BIFERA) 28 MG TABS Take 1 tablet by mouth daily at 6 (six) AM.   pravastatin (PRAVACHOL) 40 MG tablet Take 1 tablet (40 mg total) by mouth daily.   sertraline (ZOLOFT) 100 MG tablet Take 1.5 tablets (150 mg total) by mouth daily.   traZODone (DESYREL) 150 MG tablet Take 1-2 tablets (150-300 mg total) by mouth at bedtime as needed.   zolmitriptan (ZOMIG) 5 MG tablet Take 1 tablet (5 mg total) by mouth as needed for migraine.     Review of Systems      All other systems reviewed and are otherwise negative except as noted above.  Physical Exam    VS:  BP (!) 144/78   Pulse 65   Ht 5\' 7"  (1.702 m)   Wt 162 lb 12.8 oz (73.8 kg)   SpO2 95%   BMI 25.50 kg/m  , BMI Body mass index is 25.5 kg/m.  Wt Readings from Last 3 Encounters:  01/08/23 162 lb 12.8 oz (73.8 kg)  11/21/22 161 lb 3.2 oz (73.1 kg)  11/19/22 161 lb (73 kg)     GEN: Well nourished, well developed, in no acute distress. HEENT: normal. Neck: Supple, no JVD, carotid bruits, or masses. Cardiac:RRR, no murmurs, rubs, or gallops. No clubbing, cyanosis, edema.  Radials/PT 2+ and equal bilaterally.  Respiratory:  Respirations regular and unlabored, clear to auscultation bilaterally. GI: Soft, nontender, nondistended. MS: No deformity or atrophy. Skin: Warm and dry, no rash. Neuro:  Strength and sensation are intact. Psych: Normal affect.  Assessment & Plan    Syncope -no further episodes and no further dizziness -continue current medications  Palpitations -no further episodes, no need for additional metoprolol -discussed adding 12.5mg  metoprolol if episode occurs -HR 65 and BP 144/78. Would continue metoprolol 25 mg BID daily  Stage IIIb chronic kidney disease -creatinine stable 1.01  Mixed hyperlipidemia -LDL 88, now at goal < 100 -continue pravastatin 40 mg daily   Hypertension -BP much better controlled, will allow  a little hypertension given dizziness -continue current medications: Lisinopril 5 mg daily, Lopressor 25 mg twice daily    Disposition: Follow up 6 months  with Meriam Sprague, MD or APP.  Signed, Sharlene Dory, PA-C 01/08/2023, 12:38 PM [facility]

## 2023-01-08 ENCOUNTER — Ambulatory Visit: Payer: Medicare Other | Attending: Physician Assistant | Admitting: Physician Assistant

## 2023-01-08 ENCOUNTER — Encounter: Payer: Self-pay | Admitting: Physician Assistant

## 2023-01-08 VITALS — BP 144/78 | HR 65 | Ht 67.0 in | Wt 162.8 lb

## 2023-01-08 DIAGNOSIS — E785 Hyperlipidemia, unspecified: Secondary | ICD-10-CM

## 2023-01-08 DIAGNOSIS — R0602 Shortness of breath: Secondary | ICD-10-CM

## 2023-01-08 DIAGNOSIS — I10 Essential (primary) hypertension: Secondary | ICD-10-CM | POA: Diagnosis present

## 2023-01-08 DIAGNOSIS — N1832 Chronic kidney disease, stage 3b: Secondary | ICD-10-CM

## 2023-01-08 DIAGNOSIS — R55 Syncope and collapse: Secondary | ICD-10-CM

## 2023-01-08 DIAGNOSIS — R002 Palpitations: Secondary | ICD-10-CM

## 2023-01-08 NOTE — Patient Instructions (Addendum)
Medication Instructions:    Your physician recommends that you continue on your current medications as directed. Please refer to the Current Medication list given to you today.   *If you need a refill on your cardiac medications before your next appointment, please call your pharmacy*   Lab Work:  NONE ORDERED  TODAY    If you have labs (blood work) drawn today and your tests are completely normal, you will receive your results only by: MyChart Message (if you have MyChart) OR A paper copy in the mail If you have any lab test that is abnormal or we need to change your treatment, we will call you to review the results.   Testing/Procedures: NONE ORDERED  TODAY    Follow-Up: At [facility], you and your health needs are our priority.  As part of our continuing mission to provide you with exceptional heart care, we have created designated Provider Care Teams.  These Care Teams include your primary Cardiologist (physician) and Advanced Practice Providers (APPs -  Physician Assistants and Nurse Practitioners) who all work together to provide you with the care you need, when you need it.  We recommend signing up for the patient portal called "MyChart".  Sign up information is provided on this After Visit Summary.  MyChart is used to connect with patients for Virtual Visits (Telemedicine).  Patients are able to view lab/test results, encounter notes, upcoming appointments, etc.  Non-urgent messages can be sent to your provider as well.   To learn more about what you can do with MyChart, go to [email or web address].    Your next appointment:   6 month(s)  Provider:    Ronette Deter / Anne Fu NEW PATIENT  ( NEW PATIENT DUE TO [place] DEPARTMENT )  Other Instructions  KEEP YOUR SELF HYDRATED   Heart-Healthy Eating Plan Eating a healthy diet is important for the health of your heart. A heart-healthy eating plan includes: Eating less unhealthy fats. Eating more healthy  fats. Eating less salt in your food. Salt is also called sodium. Making other changes in your diet. Talk with your doctor or a diet specialist (dietitian) to create an eating plan that is right for you. What is my plan? Your doctor may recommend an eating plan that includes: Total fat: ______% or less of total calories a day. Saturated fat: ______% or less of total calories a day. Cholesterol: less than _________mg a day. Sodium: less than _________mg a day. What are tips for following this plan? Cooking Avoid frying your food. Try to bake, boil, grill, or broil it instead. You can also reduce fat by: Removing the skin from poultry. Removing all visible fats from meats. Steaming vegetables in water or broth. Meal planning  At meals, divide your plate into four equal parts: Fill one-half of your plate with vegetables and green salads. Fill one-fourth of your plate with whole grains. Fill one-fourth of your plate with lean protein foods. Eat 2-4 cups of vegetables per day. One cup of vegetables is: 1 cup (91 g) broccoli or cauliflower florets. 2 medium carrots. 1 large bell pepper. 1 large sweet potato. 1 large tomato. 1 medium white potato. 2 cups (150 g) raw leafy greens. Eat 1-2 cups of fruit per day. One cup of fruit is: 1 small apple 1 large banana 1 cup (237 g) mixed fruit, 1 large orange,  cup (82 g) dried fruit, 1 cup (240 mL) 100% fruit juice. Eat more foods that have soluble fiber. These are apples, broccoli, carrots,  beans, peas, and barley. Try to get 20-30 g of fiber per day. Eat 4-5 servings of nuts, legumes, and seeds per week: 1 serving of dried beans or legumes equals  cup (90 g) cooked. 1 serving of nuts is  oz (12 almonds, 24 pistachios, or 7 walnut halves). 1 serving of seeds equals  oz (8 g). General information Eat more home-cooked food. Eat less restaurant, buffet, and fast food. Limit or avoid alcohol. Limit foods that are high in starch and  sugar. Avoid fried foods. Lose weight if you are overweight. Keep track of how much salt (sodium) you eat. This is important if you have high blood pressure. Ask your doctor to tell you more about this. Try to add vegetarian meals each week. Fats Choose healthy fats. These include olive oil and canola oil, flaxseeds, walnuts, almonds, and seeds. Eat more omega-3 fats. These include salmon, mackerel, sardines, tuna, flaxseed oil, and ground flaxseeds. Try to eat fish at least 2 times each week. Check food labels. Avoid foods with trans fats or high amounts of saturated fat. Limit saturated fats. These are often found in animal products, such as meats, butter, and cream. These are also found in plant foods, such as palm oil, palm kernel oil, and coconut oil. Avoid foods with partially hydrogenated oils in them. These have trans fats. Examples are stick margarine, some tub margarines, cookies, crackers, and other baked goods. What foods should I eat? Fruits All fresh, canned (in natural juice), or frozen fruits. Vegetables Fresh or frozen vegetables (raw, steamed, roasted, or grilled). Green salads. Grains Most grains. Choose whole wheat and whole grains most of the time. Rice and pasta, including brown rice and pastas made with whole wheat. Meats and other proteins Lean, well-trimmed beef, veal, pork, and lamb. Chicken and [place] without skin. All fish and shellfish. Wild duck, rabbit, pheasant, and venison. Egg whites or low-cholesterol egg substitutes. Dried beans, peas, lentils, and tofu. Seeds and most nuts. Dairy Low-fat or nonfat cheeses, including ricotta and mozzarella. Skim or 1% milk that is liquid, powdered, or evaporated. Buttermilk that is made with low-fat milk. Nonfat or low-fat yogurt. Fats and oils Non-hydrogenated (trans-free) margarines. Vegetable oils, including soybean, sesame, sunflower, olive, peanut, safflower, corn, canola, and cottonseed. Salad dressings or mayonnaise  made with a vegetable oil. Beverages Mineral water. Coffee and tea. Diet carbonated beverages. Sweets and desserts Sherbet, gelatin, and fruit ice. Small amounts of dark chocolate. Limit all sweets and desserts. Seasonings and condiments All seasonings and condiments. The items listed above may not be a complete list of foods and drinks you can eat. Contact a dietitian for more options. What foods should I avoid? Fruits Canned fruit in heavy syrup. Fruit in cream or butter sauce. Fried fruit. Limit coconut. Vegetables Vegetables cooked in cheese, cream, or butter sauce. Fried vegetables. Grains Breads that are made with saturated or trans fats, oils, or whole milk. Croissants. Sweet rolls. Donuts. High-fat crackers, such as cheese crackers. Meats and other proteins Fatty meats, such as hot dogs, ribs, sausage, bacon, rib-eye roast or steak. High-fat deli meats, such as salami and bologna. Caviar. Domestic duck and goose. Organ meats, such as liver. Dairy Cream, sour cream, cream cheese, and creamed cottage cheese. Whole-milk cheeses. Whole or 2% milk that is liquid, evaporated, or condensed. Whole buttermilk. Cream sauce or high-fat cheese sauce. Yogurt that is made from whole milk. Fats and oils Meat fat, or shortening. Cocoa butter, hydrogenated oils, palm oil, coconut oil, palm kernel oil. Solid  fats and shortenings, including bacon fat, salt pork, lard, and butter. Nondairy cream substitutes. Salad dressings with cheese or sour cream. Beverages Regular sodas and juice drinks with added sugar. Sweets and desserts Frosting. Pudding. Cookies. Cakes. Pies. Milk chocolate or white chocolate. Buttered syrups. Full-fat ice cream or ice cream drinks. The items listed above may not be a complete list of foods and drinks to avoid. Contact a dietitian for more information. Summary Heart-healthy meal planning includes eating less unhealthy fats, eating more healthy fats, and making other changes  in your diet. Eat a balanced diet. This includes fruits and vegetables, low-fat or nonfat dairy, lean protein, nuts and legumes, whole grains, and heart-healthy oils and fats. This information is not intended to replace advice given to you by your health care provider. Make sure you discuss any questions you have with your health care provider. Document Revised: 09/03/2021 Document Reviewed: 09/03/2021 Elsevier Patient Education  2024 [employer].

## 2023-01-28 ENCOUNTER — Other Ambulatory Visit: Payer: Self-pay | Admitting: *Deleted

## 2023-01-28 MED ORDER — METOPROLOL TARTRATE 25 MG PO TABS: 25.0000 mg | ORAL_TABLET | Freq: Two times a day (BID) | ORAL | 1 refills | Status: DC

## 2023-02-05 ENCOUNTER — Other Ambulatory Visit (HOSPITAL_BASED_OUTPATIENT_CLINIC_OR_DEPARTMENT_OTHER): Payer: Self-pay

## 2023-02-05 MED ORDER — COMIRNATY 30 MCG/0.3ML IM SUSY
0.3000 mL | PREFILLED_SYRINGE | INTRAMUSCULAR | 0 refills | Status: DC
  Filled 2023-02-05: qty 0.3, 1d supply, fill #0

## 2023-02-07 ENCOUNTER — Other Ambulatory Visit (HOSPITAL_BASED_OUTPATIENT_CLINIC_OR_DEPARTMENT_OTHER): Payer: Self-pay

## 2023-02-21 ENCOUNTER — Other Ambulatory Visit (HOSPITAL_BASED_OUTPATIENT_CLINIC_OR_DEPARTMENT_OTHER): Payer: Self-pay | Admitting: Nurse Practitioner

## 2023-02-21 DIAGNOSIS — F909 Attention-deficit hyperactivity disorder, unspecified type: Secondary | ICD-10-CM

## 2023-02-27 ENCOUNTER — Encounter: Payer: Self-pay | Admitting: Nurse Practitioner

## 2023-02-28 ENCOUNTER — Other Ambulatory Visit: Payer: Self-pay | Admitting: Medical

## 2023-02-28 DIAGNOSIS — F909 Attention-deficit hyperactivity disorder, unspecified type: Secondary | ICD-10-CM

## 2023-03-03 ENCOUNTER — Other Ambulatory Visit: Payer: Self-pay | Admitting: Medical

## 2023-03-03 ENCOUNTER — Telehealth: Payer: Self-pay | Admitting: Nurse Practitioner

## 2023-03-03 DIAGNOSIS — F909 Attention-deficit hyperactivity disorder, unspecified type: Secondary | ICD-10-CM

## 2023-03-03 MED ORDER — AMPHETAMINE-DEXTROAMPHETAMINE 30 MG PO TABS
30.0000 mg | ORAL_TABLET | Freq: Two times a day (BID) | ORAL | 0 refills | Status: DC
Start: 2023-03-03 — End: 2023-03-04

## 2023-03-03 NOTE — Telephone Encounter (Signed)
  Pt's husband left voicemail   Adderall supposed to go to [facility]  not [facility]

## 2023-03-03 NOTE — Telephone Encounter (Signed)
I cancelled the P.A. please see note regarding sending Rx to correct pharmacy

## 2023-03-04 ENCOUNTER — Other Ambulatory Visit: Payer: Self-pay | Admitting: Medical

## 2023-03-04 DIAGNOSIS — F909 Attention-deficit hyperactivity disorder, unspecified type: Secondary | ICD-10-CM

## 2023-03-04 MED ORDER — AMPHETAMINE-DEXTROAMPHETAMINE 30 MG PO TABS: 30.0000 mg | ORAL_TABLET | Freq: Two times a day (BID) | ORAL | 0 refills | Status: DC

## 2023-03-23 ENCOUNTER — Telehealth: Payer: Self-pay | Admitting: Nurse Practitioner

## 2023-03-23 NOTE — Telephone Encounter (Signed)
P.A. AMPHETAMINE-DEXTROAMPHETAMINE 30MG  PA Case ID #: [id number] Need Help? Call us at [phone] Status sent iconSent to Plan today Drug Amphetamine-Dextroamphetamine 30MG  tablets ePA cloud logo Form [facility] Electronic Prior Authorization Request Form

## 2023-03-28 ENCOUNTER — Other Ambulatory Visit (HOSPITAL_BASED_OUTPATIENT_CLINIC_OR_DEPARTMENT_OTHER): Payer: Self-pay | Admitting: Nurse Practitioner

## 2023-03-28 ENCOUNTER — Other Ambulatory Visit (HOSPITAL_BASED_OUTPATIENT_CLINIC_OR_DEPARTMENT_OTHER): Payer: Self-pay

## 2023-03-28 ENCOUNTER — Other Ambulatory Visit: Payer: Self-pay | Admitting: *Deleted

## 2023-03-28 DIAGNOSIS — E782 Mixed hyperlipidemia: Secondary | ICD-10-CM

## 2023-03-28 DIAGNOSIS — I10 Essential (primary) hypertension: Secondary | ICD-10-CM

## 2023-03-28 MED ORDER — LISINOPRIL 5 MG PO TABS: 5.0000 mg | ORAL_TABLET | Freq: Every day | ORAL | 2 refills | Status: DC

## 2023-03-31 NOTE — Telephone Encounter (Signed)
P.A. approved til further notice,  sent mychart message

## 2023-06-18 ENCOUNTER — Other Ambulatory Visit (HOSPITAL_BASED_OUTPATIENT_CLINIC_OR_DEPARTMENT_OTHER): Payer: Self-pay | Admitting: Nurse Practitioner

## 2023-06-18 DIAGNOSIS — F341 Dysthymic disorder: Secondary | ICD-10-CM

## 2023-06-18 DIAGNOSIS — G4700 Insomnia, unspecified: Secondary | ICD-10-CM

## 2023-06-18 NOTE — Telephone Encounter (Signed)
Last apt 11/21/22

## 2023-06-23 ENCOUNTER — Encounter: Payer: Self-pay | Admitting: Family Medicine

## 2023-06-23 ENCOUNTER — Ambulatory Visit (INDEPENDENT_AMBULATORY_CARE_PROVIDER_SITE_OTHER): Payer: Medicare Other | Admitting: Family Medicine

## 2023-06-23 VITALS — BP 140/94 | HR 60 | Temp 98.0°F | Ht 67.0 in | Wt 158.0 lb

## 2023-06-23 DIAGNOSIS — F341 Dysthymic disorder: Secondary | ICD-10-CM

## 2023-06-23 DIAGNOSIS — I10 Essential (primary) hypertension: Secondary | ICD-10-CM | POA: Diagnosis not present

## 2023-06-23 DIAGNOSIS — G43709 Chronic migraine without aura, not intractable, without status migrainosus: Secondary | ICD-10-CM

## 2023-06-23 DIAGNOSIS — N1831 Chronic kidney disease, stage 3a: Secondary | ICD-10-CM | POA: Diagnosis not present

## 2023-06-23 DIAGNOSIS — Z5181 Encounter for therapeutic drug level monitoring: Secondary | ICD-10-CM

## 2023-06-23 DIAGNOSIS — R110 Nausea: Secondary | ICD-10-CM

## 2023-06-23 DIAGNOSIS — E039 Hypothyroidism, unspecified: Secondary | ICD-10-CM

## 2023-06-23 NOTE — Patient Instructions (Signed)
We are checking a head CT due to the change in your migraines--having them daily, and having them wake you up from sleep.  Please continue to monitor your blood pressure. Keep a list, with comments, as we discussed (pain levels, poor sleep, salty foods, etc). Please read labels and limit your sodium (cut back on pretzels, avoid canned foods, chinese food, processed meats)  It is is possible that stopping the sertraline is contributing to some of your symptoms--more frequent migraines and possibly anxiety worsening blood pressure.  Restart the sertraline.  Take 1/2 tablet for a week, then increase to the full tablet. You can stay at the 100 mg dose for longer, if you were unsure if you truly needed the 150 mg.  If your moods are not adequately treated, you can further titrate back up to the 150 mg dose after a month.  Do a 2 week trial of Prilosec OTC. Take this once daily before a meal (before breakfast). Be sure to limit your caffeine, chocolate, spicy foods, citrus and other acidi foods (tomatoes). Wait at least 2 hours after eating before reclining or laying down. Eat small meals, with snacks if needed.

## 2023-06-23 NOTE — Progress Notes (Signed)
Chief Complaint  Patient presents with   Nausea    Had a virus about a month ago, not seen. Seemed to have went away. Nausea was a component of the virus. Over the last few weeks has had nausea off and on and some migraines. Has also had some high bp readings.Had a couple of vomiting episodes w/nausea.    Woke up one morning at 4am 4-6 weeks ago and vomited.  She was sick for 2-3 days with nausea and vomiting. Can't recall if she had diarrhea, thinks she may have had some.  She wasn't feeling good (residual nausea), so she stopped a lot of her meds at that time.  She stopped taking sertraline, iron, adderall, pravastatin and trazodone.  She continues to have nausea, mainly seems to be after eating. Pepto bismol seems to help with nausea and stomach upset. Avoids all NSAIDs d/t CKD.  She has had persistent headaches since that time.  She reports they are migraines, just like her typical migraines, but now occurring daily.  Sometimes she is woken up in the middle of the night with a migraine.  No aura. Zomig is effective if taken early enough.  She took one this morning when HA was starting, and is feeling fine now.  They have also noted some elevated BP's. They brought monitor to verify accuracy (accurate).  Last imaging 05/2022, MRI at [facility]: IMPRESSION:  2 punctate foci of magnetic susceptibility in the right paramedian pons. 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 Old microhemorrhage is also possible, but given the lack of evidence of vascular disease elsewhere, this is considered less likely.     PMH, PSH, SH reviewed  ADD, anxiety/depression, HTN, hypothyroidism, migraines (since 20's). Hasn't had any dizziness (since hydrochlorothiazide stopped, and metroprolol increased, lisinopril dose decreased to maintain BP) Nonsustained SVT on monitor in 04/2022   Outpatient Encounter Medications as of 06/23/2023  Medication Sig Note   levothyroxine (SYNTHROID, LEVOTHROID) 25 MCG tablet Take 25  mcg by mouth daily.     lisinopril (ZESTRIL) 5 MG tablet Take 1 tablet (5 mg total) by mouth daily.    metoprolol tartrate (LOPRESSOR) 25 MG tablet Take 1 tablet (25 mg total) by mouth 2 (two) times daily. 06/23/2023: Can take an extra 1/2 tablet as needed for tachycardia; hasn't needed.   Multiple Vitamins-Minerals (PRESERVISION AREDS 2 PO) Take by mouth.    zolmitriptan (ZOMIG) 5 MG tablet Take 1 tablet (5 mg total) by mouth as needed for migraine. 06/23/2023: Took one this am   [DISCONTINUED] metoprolol tartrate (LOPRESSOR) 25 MG tablet Take 12.5 mg by mouth 2 (two) times daily. Pt takes an extra 1/2 tablet 12.5 mg with heart rating episodes. 06/23/2023: Taking 25 mg BID; 1/2 tablet prn tachycardia, hasn't needed.   amphetamine-dextroamphetamine (ADDERALL) 30 MG tablet Take 1 tablet by mouth 2 (two) times daily. (Patient not taking: Reported on 06/23/2023) 06/23/2023: Last dose about a month ago   Polysacch Fe Cmp-Fe Heme Poly (BIFERA) 28 MG TABS Take 1 tablet by mouth daily at 6 (six) AM. (Patient not taking: Reported on 06/23/2023) 06/23/2023: Stopped about a month ago, when she was feeling bad   pravastatin (PRAVACHOL) 40 MG tablet Take 1 tablet (40 mg total) by mouth daily. (Patient not taking: Reported on 06/23/2023) 06/23/2023: Has not taken in about a month   sertraline (ZOLOFT) 100 MG tablet Take 1.5 tablets (150 mg total) by mouth daily. (Patient not taking: Reported on 06/23/2023) 06/23/2023: Stopped taking about a month ago  traZODone (DESYREL) 150 MG tablet TAKE 1-2 TABLETS BY MOUTH AT BEDTIME AS NEEDED. (Patient not taking: Reported on 06/23/2023) 06/23/2023: As not taken in a month   [DISCONTINUED] COVID-19 mRNA vaccine 2023-2024 (COMIRNATY) syringe Inject 0.3 mLs into the muscle.    No facility-administered encounter medications on file as of 06/23/2023.   Allergies  Allergen Reactions   Codeine Nausea And Vomiting   Morphine And Codeine     Gi upset    ROS:  No f/c.  +daily  migraines, nausea after eating per HPI Bowels are normal. No allergies, URI symptoms, ear pain. No vision changes.  No numbness/tingling or weakness. No urinary complaints. No hair/nail changes. Fatigue, too tired to exercise.   PHYSICAL EXAM:  BP (!) 140/94   Pulse 60   Temp 98 F (36.7 C) (Oral)   Ht 5\' 7"  (1.702 m)   Wt 158 lb (71.7 kg)   BMI 24.75 kg/m   152/80 on repeat by MD, RA  Wt Readings from Last 3 Encounters:  06/23/23 158 lb (71.7 kg)  01/08/23 162 lb 12.8 oz (73.8 kg)  11/21/22 161 lb 3.2 oz (73.1 kg)   Pleasant, well-appearing female, accompanied by her husband HEENT: conjunctiva and sclera are clear, EOMI. OP clear. TM's and EAC's normal Fundi benign. PERRL Neck: no lymphadenopathy, thyromegaly or mass, no carotid bruit Heart: regular rate and rhythm, no murmur Lungs: clear bilaterally Back:  no spinal or CVA tenderness Abdomen: soft, nontender, no organomegaly or mass. Discomfort is above umbilicus, in epigastrium, but not tender. Extremities: no edema Neuro: alert and oriented, cranial nerves 2-12 intact. Normal DTR's. Strength, sensation. Normal finger to nose. Negative Romberg. Psych: mildly anxious. Normal range of affect, speech, eye contact, hygiene and grooming    ASSESSMENT/PLAN:  Chronic migraine without aura without status migrainosus, not intractable - daily migraines since illness 4-6 wks ago. ?if stopping SSRI contributes to increased migraines. Restart sertraline. Check CT - Plan: CT HEAD WO CONTRAST ( )  Stage 3a chronic kidney disease (HCC) - avoid NSAIDs; will avoid contrast with CT  Essential hypertension, benign - BP mildly elevated, prev normal. Discussed contrib factors (stress/anxiety/pain/salt, etc). To monitor/record/add comments.  Dysthymic disorder - Off SSRI; denies depression, some increased irritability and anxiety. Restart sertraline at 1/2 tablet x 1 week, then 100mg .  Hypothyroidism, unspecified type - Plan:  TSH  Nausea - occuring after eating suggests GERD.  proper diet/precautions reviewed. Rec 2 week trial of Prilosec OTC - Plan: Comprehensive metabolic panel, CBC with Differential/Platelet  Medication monitoring encounter - Plan: Comprehensive metabolic panel, CBC with Differential/Platelet, TSH   Next visit with cardiologist is 07/04/23.  I spent 60 minutes dedicated to the care of this patient, including pre-visit review of records, face to face time, post-visit ordering of testing and documentation.   We are checking a head CT due to the change in your migraines--having them daily, and having them wake you up from sleep.  Please continue to monitor your blood pressure. Keep a list, with comments, as we discussed (pain levels, poor sleep, salty foods, etc). Please read labels and limit your sodium (cut back on pretzels, avoid canned foods, chinese food, processed meats)  It is is possible that stopping the sertraline is contributing to some of your symptoms--more frequent migraines and possibly anxiety worsening blood pressure.  Restart the sertraline.  Take 1/2 tablet for a week, then increase to the full tablet. You can stay at the 100 mg dose for longer, if you were unsure if you  truly needed the 150 mg.  If your moods are not adequately treated, you can further titrate back up to the 150 mg dose after a month.  Do a 2 week trial of Prilosec OTC. Take this once daily before a meal (before breakfast). Be sure to limit your caffeine, chocolate, spicy foods, citrus and other acidi foods (tomatoes). Wait at least 2 hours after eating before reclining or laying down. Eat small meals, with snacks if needed.

## 2023-06-24 LAB — CBC WITH DIFFERENTIAL/PLATELET
Basophils Absolute: 0 10*3/uL (ref 0.0–0.2)
Basos: 0 %
EOS (ABSOLUTE): 0.2 10*3/uL (ref 0.0–0.4)
Eos: 2 %
Hematocrit: 46.5 % (ref 34.0–46.6)
Hemoglobin: 15.3 g/dL (ref 11.1–15.9)
Immature Grans (Abs): 0 10*3/uL (ref 0.0–0.1)
Immature Granulocytes: 0 %
Lymphocytes Absolute: 2.7 10*3/uL (ref 0.7–3.1)
Lymphs: 30 %
MCH: 31.5 pg (ref 26.6–33.0)
MCHC: 32.9 g/dL (ref 31.5–35.7)
MCV: 96 fL (ref 79–97)
Monocytes Absolute: 0.5 10*3/uL (ref 0.1–0.9)
Monocytes: 6 %
Neutrophils Absolute: 5.5 10*3/uL (ref 1.4–7.0)
Neutrophils: 62 %
Platelets: 310 10*3/uL (ref 150–450)
RBC: 4.85 x10E6/uL (ref 3.77–5.28)
RDW: 12.8 % (ref 11.7–15.4)
WBC: 9 10*3/uL (ref 3.4–10.8)

## 2023-06-24 LAB — COMPREHENSIVE METABOLIC PANEL
ALT: 9 [IU]/L (ref 0–32)
AST: 17 [IU]/L (ref 0–40)
Albumin: 4.8 g/dL (ref 3.9–4.9)
Alkaline Phosphatase: 66 [IU]/L (ref 44–121)
BUN/Creatinine Ratio: 25 (ref 12–28)
BUN: 25 mg/dL (ref 8–27)
Bilirubin Total: 0.4 mg/dL (ref 0.0–1.2)
CO2: 23 mmol/L (ref 20–29)
Calcium: 10 mg/dL (ref 8.7–10.3)
Chloride: 101 mmol/L (ref 96–106)
Creatinine, Ser: 1.02 mg/dL — ABNORMAL HIGH (ref 0.57–1.00)
Globulin, Total: 2.4 g/dL (ref 1.5–4.5)
Glucose: 97 mg/dL (ref 70–99)
Potassium: 4.5 mmol/L (ref 3.5–5.2)
Sodium: 140 mmol/L (ref 134–144)
Total Protein: 7.2 g/dL (ref 6.0–8.5)
eGFR: 60 mL/min/{1.73_m2} (ref >59)

## 2023-06-24 LAB — TSH: TSH: 2.03 u[IU]/mL (ref 0.450–4.500)

## 2023-06-25 ENCOUNTER — Other Ambulatory Visit (HOSPITAL_BASED_OUTPATIENT_CLINIC_OR_DEPARTMENT_OTHER): Payer: Self-pay

## 2023-06-25 MED ORDER — FLUAD 0.5 ML IM SUSY
0.5000 mL | PREFILLED_SYRINGE | Freq: Once | INTRAMUSCULAR | 0 refills | Status: AC
  Filled 2023-06-25: qty 0.5, 1d supply, fill #0

## 2023-06-25 MED ORDER — COMIRNATY 30 MCG/0.3ML IM SUSY
0.3000 mL | PREFILLED_SYRINGE | Freq: Once | INTRAMUSCULAR | 0 refills | Status: AC
  Filled 2023-06-25: qty 0.3, 1d supply, fill #0

## 2023-06-27 ENCOUNTER — Ambulatory Visit (HOSPITAL_COMMUNITY)
Admission: RE | Admit: 2023-06-27 | Discharge: 2023-06-27 | Disposition: A | Discharge status: Home / Self Care | Payer: Medicare Other | Source: Ambulatory Visit | Attending: Family Medicine | Admitting: Family Medicine

## 2023-06-27 DIAGNOSIS — G43709 Chronic migraine without aura, not intractable, without status migrainosus: Secondary | ICD-10-CM | POA: Insufficient documentation

## 2023-07-02 ENCOUNTER — Encounter: Payer: Self-pay | Admitting: Family Medicine

## 2023-07-04 ENCOUNTER — Ambulatory Visit: Payer: Medicare Other | Attending: Internal Medicine | Admitting: Internal Medicine

## 2023-07-04 ENCOUNTER — Encounter: Payer: Self-pay | Admitting: Internal Medicine

## 2023-07-04 VITALS — BP 134/78 | HR 48 | Ht 67.0 in | Wt 165.0 lb

## 2023-07-04 DIAGNOSIS — E782 Mixed hyperlipidemia: Secondary | ICD-10-CM | POA: Diagnosis not present

## 2023-07-04 DIAGNOSIS — I10 Essential (primary) hypertension: Secondary | ICD-10-CM | POA: Diagnosis not present

## 2023-07-04 DIAGNOSIS — I471 Supraventricular tachycardia, unspecified: Secondary | ICD-10-CM | POA: Diagnosis present

## 2023-07-04 DIAGNOSIS — R001 Bradycardia, unspecified: Secondary | ICD-10-CM | POA: Insufficient documentation

## 2023-07-04 DIAGNOSIS — E785 Hyperlipidemia, unspecified: Secondary | ICD-10-CM | POA: Insufficient documentation

## 2023-07-04 HISTORY — DX: Bradycardia, unspecified: R00.1

## 2023-07-04 HISTORY — DX: Supraventricular tachycardia, unspecified: I47.10

## 2023-07-04 NOTE — Progress Notes (Signed)
Cardiology Office Note:    Date:  07/04/2023   ID:  MERYAM Finley, DOB July 14, 1956, MRN [id number]  PCP:  Tollie Eth, NP   [facility] Providers Cardiologist:  Dana Sprague, MD (Inactive) {  CC: Transition to new cardiologist  History of Present Illness:    Dana Finley is a 67 y.o. female with a hx of anxiety, HTN, depression, and migraines who presents to clinic for follow-up.  Ms. Dana Finley, a 67 year old with a history of marked sinus bradycardia, presented with a lower heart rate than her evaluation in 2023. She experienced episodes of dizziness; however, there was no evidence of carotid artery disease on her evaluation. The patient wore a heart monitor which detected rare supraventricular tachycardia, but these episodes did not correlate with her symptoms. Her heart function was noted to be low normal during her 2023 evaluation. Despite these findings, the patient had a negative stress test and there was no evidence of cardiac syncope or near syncope. The patient also had negative orthostatics. Her last follow-up was with Doctor Shari Prows in 2023.  She is having worse fatigue since a viral illness, but has otherwise been well.  No CP, SOB, or near syncope.  Palpitations have improved.  She is able to walk ~3-4 miles on a treadmill.  Past Medical History:  Diagnosis Date   ACHILLES TENDINITIS 12/19/2009   Qualifier: Diagnosis of  By: Alphonzo Severance MD, Loni Dolly    Acute upper respiratory infection 05/31/2016   ADD (attention deficit disorder)    Anxiety    Chalazion of right lower eyelid 10/08/2012   COLONIC POLYPS, HYPERPLASTIC 11/19/2005   Qualifier: Diagnosis of  By: Thereasa Solo     Depression    DISC DISEASE, LUMBOSACRAL SPINE 12/07/2009   Qualifier: Diagnosis of  By: Alphonzo Severance MD, Loni Dolly    Diverticula of colon    Hemorrhoids    History of colonic polyps hyperplastic   Hypertension    Hypothyroidism    IBS (irritable bowel syndrome)    Insomnia,  persistent    Migraine headache without aura    lumbosacral disc desease   Osteopenia    OSTEOPENIA 06/30/2007   Qualifier: Diagnosis of  By: Alphonzo Severance MD, Loni Dolly    RHINITIS 12/27/2009   Qualifier: Diagnosis of  By: Alphonzo Severance MD, Willie R    Serrated adenoma of colon 06/2012   Skin lesion 11/26/2021   Weight loss    WEIGHT LOSS 08/20/2007   Qualifier: Diagnosis of  By: Alphonzo Severance MD, Loni Dolly    Wellness examination 02/26/2022    Past Surgical History:  Procedure Laterality Date   ABDOMINAL HYSTERECTOMY     1996   APPENDECTOMY     CATARACT EXTRACTION     bilateral   COLONOSCOPY     EYE SURGERY     muscle surgery; has had approx 18 surgeries on both eyes   POLYPECTOMY      Current Medications: Current Meds  Medication Sig   amphetamine-dextroamphetamine (ADDERALL) 30 MG tablet Take 1 tablet by mouth 2 (two) times daily.   levothyroxine (SYNTHROID, LEVOTHROID) 25 MCG tablet Take 25 mcg by mouth daily.    lisinopril (ZESTRIL) 5 MG tablet Take 1 tablet (5 mg total) by mouth daily.   metoprolol tartrate (LOPRESSOR) 25 MG tablet Take 1 tablet (25 mg total) by mouth 2 (two) times daily.   Multiple Vitamins-Minerals (PRESERVISION AREDS 2 PO) Take by mouth.   Polysacch Fe Cmp-Fe Heme Poly (  BIFERA) 28 MG TABS Take 1 tablet by mouth daily at 6 (six) AM.   pravastatin (PRAVACHOL) 40 MG tablet Take 1 tablet (40 mg total) by mouth daily.   sertraline (ZOLOFT) 100 MG tablet Take 1.5 tablets (150 mg total) by mouth daily.   traZODone (DESYREL) 150 MG tablet TAKE 1-2 TABLETS BY MOUTH AT BEDTIME AS NEEDED.   zolmitriptan (ZOMIG) 5 MG tablet Take 1 tablet (5 mg total) by mouth as needed for migraine.     Allergies:   Codeine and Morphine and codeine   Social History   Socioeconomic History   Marital status: Married    Spouse name: Not on file   Number of children: 2   Years of education: Not on file   Highest education level: Not on file  Occupational History   Occupation: home     Employer: UNEMPLOYED  Tobacco Use   Smoking status: Former    Current packs/day: 0.00    Types: Cigarettes    Quit date: 06/12/1977    Years since quitting: 46.0   Smokeless tobacco: Never  Substance and Sexual Activity   Alcohol use: Yes    Comment: occasional   Drug use: No   Sexual activity: Not on file  Other Topics Concern   Not on file  Social History Narrative   Not on file   Social Determinants of Health   Financial Resource Strain: Low Risk  (11/19/2022)   Overall Financial Resource Strain (CARDIA)    Difficulty of Paying Living Expenses: Not hard at all  Food Insecurity: No Food Insecurity (11/19/2022)   Hunger Vital Sign    Worried About Running Out of Food in the Last Year: Never true    Ran Out of Food in the Last Year: Never true  Transportation Needs: No Transportation Needs (11/19/2022)   PRAPARE - Administrator, Civil Service (Medical): No    Lack of Transportation (Non-Medical): No  Physical Activity: Insufficiently Active (11/19/2022)   Exercise Vital Sign    Days of Exercise per Week: 4 days    Minutes of Exercise per Session: 30 min  Stress: No Stress Concern Present (11/19/2022)   [employer] of Occupational Health - Occupational Stress Questionnaire    Feeling of Stress : Not at all  Social Connections: Moderately Integrated (11/19/2022)   Social Connection and Isolation Panel [NHANES]    Frequency of Communication with Friends and Family: More than three times a week    Frequency of Social Gatherings with Friends and Family: Three times a week    Attends Religious Services: 1 to 4 times per year    Active Member of Clubs or Organizations: No    Attends Engineer, structural: Never    Marital Status: Married     Family History: The patient's family history includes Colon cancer (age of onset: 56) in her cousin; Colon cancer (age of onset: 58) in her maternal uncle and sister; Colon polyps in her son; Diabetes in her mother;  Early death in her sister; Heart disease in her mother; Leukemia in her brother; Pancreatic cancer (age of onset: 50) in her father; Stomach cancer (age of onset: 58) in her maternal uncle; Stroke in her mother. There is no history of Rectal cancer.  ROS:   Please see HPI   EKGs/Labs/Other Studies Reviewed:    Cardiac Studies & Procedures     STRESS TESTS  MYOCARDIAL PERFUSION IMAGING 04/23/2022  Narrative   The study is normal. The study  is low risk.   No ST deviation was noted.   LV perfusion is abnormal. There is a small defect with moderate reduction in uptake present in the apex location(s) that is fixed. The defect appears worse at rest than with stress and there is normal wall motion in the defect area. Consistent with artifact caused by subdiaphragmatic activity.   Left ventricular function is normal. Nuclear stress EF: 66 %. The left ventricular ejection fraction is hyperdynamic (>65%). End diastolic cavity size is normal.   Prior study not available for comparison.   ECHOCARDIOGRAM  ECHOCARDIOGRAM COMPLETE 04/23/2022  Narrative ECHOCARDIOGRAM REPORT    Patient Name:   Dana Finley Date of Exam: 04/23/2022 Medical Rec #:  [id number]     Height:       67.0 in Accession #:    [id number]    Weight:       163.0 lb Date of Birth:  1956/05/31      BSA:          1.854 m Patient Age:    66 years      BP:           128/79 mmHg Patient Gender: F             HR:           60 bpm. Exam Location:  [address]  Procedure: 2D Echo, 3D Echo, Cardiac Doppler and Color Doppler  Indications:    R55 Syncope  History:        Patient has no prior history of Echocardiogram examinations. CKD, Signs/Symptoms:Shortness of Breath; Risk Factors:HLD and Hypertension.  Sonographer:    Clearence Ped RCS Referring Phys: [id number] [facility]ATHER E [place]  IMPRESSIONS   1. Left ventricular ejection fraction, by estimation, is 50 to 55%. Left ventricular ejection fraction by 3D volume is 54 %. The  left ventricle has low normal function. The left ventricle has no regional wall motion abnormalities. Left ventricular diastolic parameters are consistent with Grade I diastolic dysfunction (impaired relaxation). 2. Right ventricular systolic function is normal. The right ventricular size is normal. 3. The mitral valve is normal in structure. No evidence of mitral valve regurgitation. No evidence of mitral stenosis. 4. The aortic valve is tricuspid. Aortic valve regurgitation is not visualized. No aortic stenosis is present. 5. The inferior vena cava is normal in size with greater than 50% respiratory variability, suggesting right atrial pressure of 3 mmHg.  FINDINGS Left Ventricle: Left ventricular ejection fraction, by estimation, is 50 to 55%. Left ventricular ejection fraction by 3D volume is 54 %. The left ventricle has low normal function. The left ventricle has no regional wall motion abnormalities. The left ventricular internal cavity size was normal in size. There is no left ventricular hypertrophy. Left ventricular diastolic parameters are consistent with Grade I diastolic dysfunction (impaired relaxation).  Right Ventricle: The right ventricular size is normal. No increase in right ventricular wall thickness. Right ventricular systolic function is normal.  Left Atrium: Left atrial size was normal in size.  Right Atrium: Right atrial size was normal in size.  Pericardium: There is no evidence of pericardial effusion.  Mitral Valve: The mitral valve is normal in structure. No evidence of mitral valve regurgitation. No evidence of mitral valve stenosis.  Tricuspid Valve: The tricuspid valve is normal in structure. Tricuspid valve regurgitation is not demonstrated. No evidence of tricuspid stenosis.  Aortic Valve: The aortic valve is tricuspid. Aortic valve regurgitation is not visualized. No aortic stenosis is present.  Pulmonic Valve: The pulmonic valve was normal in structure.  Pulmonic valve regurgitation is not visualized. No evidence of pulmonic stenosis.  Aorta: The aortic root is normal in size and structure.  Venous: The inferior vena cava is normal in size with greater than 50% respiratory variability, suggesting right atrial pressure of 3 mmHg.  IAS/Shunts: No atrial level shunt detected by color flow Doppler.   LEFT VENTRICLE PLAX 2D LVIDd:         4.60 cm         Diastology LVIDs:         3.30 cm         LV e' medial:    5.22 cm/s LV PW:         0.80 cm         LV E/e' medial:  13.9 LV IVS:        0.80 cm         LV e' lateral:   6.20 cm/s LVOT diam:     1.90 cm         LV E/e' lateral: 11.7 LV SV:         50 LV SV Index:   27 LVOT Area:     2.84 cm        3D Volume EF LV 3D EF:    Left ventricul ar ejection fraction by 3D volume is 54 %.  3D Volume EF: 3D EF:        54 % LV EDV:       75 ml LV ESV:       35 ml LV SV:        40 ml  RIGHT VENTRICLE RV Basal diam:  2.80 cm RV S prime:     13.10 cm/s TAPSE (M-mode): 1.9 cm  LEFT ATRIUM             Index        RIGHT ATRIUM          Index LA diam:        2.90 cm 1.56 cm/m   RA Area:     7.01 cm LA Vol (A2C):   45.1 ml 24.33 ml/m  RA Volume:   11.90 ml 6.42 ml/m LA Vol (A4C):   25.8 ml 13.92 ml/m LA Biplane Vol: 35.7 ml 19.26 ml/m AORTIC VALVE LVOT Vmax:   76.60 cm/s LVOT Vmean:  55.200 cm/s LVOT VTI:    0.175 m  AORTA Ao Root diam: 3.20 cm Ao Asc diam:  3.00 cm  MITRAL VALVE MV Area (PHT):              SHUNTS MV Decel Time:              Systemic VTI:  0.18 m MV E velocity: 72.30 cm/s   Systemic Diam: 1.90 cm MV A velocity: 107.00 cm/s MV E/A ratio:  0.68  Donato Schultz MD Electronically signed by Donato Schultz MD Signature Date/Time: 04/23/2022/9:51:47 AM    Final    MONITORS  LONG TERM MONITOR (3-14 DAYS) 04/16/2022  Narrative   Patch wear time was 6 days and 22 hours   Predominant rhythm was NSR with average HR 69bpm   There were 12 runs of nonsustained SVT  with longest lasting 13 beats   Rare SVE (<1%), rare VE (<1%)   Patient triggered events correlate with NSR, sinus tach, SVE and PVCs   No sustained arrhythmias or significant pauses   Patch Wear Time:  6  days and 22 hours (2023-08-19T13:36:04-399 to 2023-08-26T11:57:11-398)  Patient had a min HR of 46 bpm, max HR of 174 bpm, and avg HR of 69 bpm. Predominant underlying rhythm was Sinus Rhythm. 12 Supraventricular Tachycardia runs occurred, the run with the fastest interval lasting 12 beats with a max rate of 174 bpm, the longest lasting 13 beats with an avg rate of 133 bpm. Isolated SVEs were rare (<1.0%), SVE Couplets were rare (<1.0%), and SVE Triplets were rare (<1.0%). Isolated VEs were rare (<1.0%), VE Couplets were rare (<1.0%), and no VE Triplets were present.  Laurance Flatten, MD             Recent Labs: 06/23/2023: ALT 9; BUN 25; Creatinine, Ser 1.02; Hemoglobin 15.3; Platelets 310; Potassium 4.5; Sodium 140; TSH 2.030   Recent Lipid Panel    Component Value Date/Time   CHOL 175 10/22/2022 1041   TRIG 103 10/22/2022 1041   HDL 69 10/22/2022 1041   CHOLHDL 2.5 10/22/2022 1041   CHOLHDL 3 10/09/2010 0847   VLDL 18.6 10/09/2010 0847   LDLCALC 88 10/22/2022 1041   LDLDIRECT 131.0 10/09/2010 0847    Physical Exam:    VS:  BP 134/78   Pulse (!) 48   Ht 5\' 7"  (1.702 m)   Wt 165 lb (74.8 kg)   SpO2 96%   BMI 25.84 kg/m     Wt Readings from Last 3 Encounters:  07/04/23 165 lb (74.8 kg)  06/23/23 158 lb (71.7 kg)  01/08/23 162 lb 12.8 oz (73.8 kg)     GEN:  Well nourished, well developed in no acute distress HEENT: Normal NECK: No JVD; No carotid bruits CARDIAC: Regular bradycardia, 1/6 systolic murmur, 2+ DP pulses RESPIRATORY:  Clear to auscultation without rales, wheezing or rhonchi  ABDOMEN: Soft, non-tender, non-distended MUSCULOSKELETAL:  No edema; No deformity  SKIN: Warm and dry NEUROLOGIC:  Alert and oriented x 3 PSYCHIATRIC:  Normal affect    ASSESSMENT:    1. Sinus bradycardia   2. Essential hypertension   3. Hyperlipidemia LDL goal <70   4. Mixed hyperlipidemia   5. SVT (supraventricular tachycardia) (HCC)      PLAN:    Sinus Bradycardia - Marked sinus bradycardia noted today with heart rate lower than 2023 evaluation. No evidence of carotid artery disease, orthostatic hypotension, cardiac syncope, or near syncope. - we discussed that she appear asymptomatic.  If her fatigue does not resolve with recovery from viral illness will decrease BB dose in half and repeat heart monitor.  If near syncope returns we will start flecainide 50 mg PO BID, given her CKD I would have her see EP in f/u to discuss other options  Supraventricular Tachycardia (SVT) - Rare episodes of SVT noted on heart monitor, not correlated with symptoms. Heart function was low normal in 2023 evaluation. Negative stress test. - AAD as above  Hypertension - controlled on current medications with white coat phenomenon and labile BP - if we decrease metoprolol, may need increase in ACEi and needs close follow up  CKD IIIB: -Followed by Dr. Signe Colt, renally dose medications  HLD -Continue pravastatin 40mg  daily  One year unless new sx or above      Medication Adjustments/Labs and Tests Ordered: Current medicines are reviewed at length with the patient today.  Concerns regarding medicines are outlined above.   Orders Placed This Encounter  Procedures   EKG 12-Lead   No orders of the defined types were placed in this encounter.  Patient Instructions  Medication Instructions:  Your physician recommends that you continue on your current medications as directed. Please refer to the Current Medication list given to you today.  *If you need a refill on your cardiac medications before your next appointment, please call your pharmacy*   Lab Work: NONE If you have labs (blood work) drawn today and your tests are completely normal, you will receive  your results only by: MyChart Message (if you have MyChart) OR A paper copy in the mail If you have any lab test that is abnormal or we need to change your treatment, we will call you to review the results.   Testing/Procedures: NONE   Follow-Up: At [facility], you and your health needs are our priority.  As part of our continuing mission to provide you with exceptional heart care, we have created designated Provider Care Teams.  These Care Teams include your primary Cardiologist (physician) and Advanced Practice Providers (APPs -  Physician Assistants and Nurse Practitioners) who all work together to provide you with the care you need, when you need it.   Your next appointment:   1 year(s)  Provider:   Riley Lam, MD       [facility]ump[facility],acting as a scribe for Christell Constant, MD.,have documented all relevant documentation on the behalf of Christell Constant, MD,as directed by  Christell Constant, MD while in the presence of Christell Constant, MD.  I, Christell Constant, MD, have reviewed all documentation for this visit. The documentation on 07/04/23 for the exam, diagnosis, procedures, and orders are all accurate and complete.   Signed, Christell Constant, MD  07/04/2023 12:46 PM    [facility]

## 2023-07-04 NOTE — Patient Instructions (Signed)

## 2023-07-09 ENCOUNTER — Ambulatory Visit: Payer: Medicare Other | Admitting: Nurse Practitioner

## 2023-07-09 ENCOUNTER — Encounter: Payer: Self-pay | Admitting: Nurse Practitioner

## 2023-07-09 VITALS — BP 144/84 | HR 58 | Wt 162.6 lb

## 2023-07-09 DIAGNOSIS — G9339 Other post infection and related fatigue syndromes: Secondary | ICD-10-CM

## 2023-07-09 DIAGNOSIS — G43909 Migraine, unspecified, not intractable, without status migrainosus: Secondary | ICD-10-CM

## 2023-07-09 DIAGNOSIS — Z8679 Personal history of other diseases of the circulatory system: Secondary | ICD-10-CM

## 2023-07-09 DIAGNOSIS — I10 Essential (primary) hypertension: Secondary | ICD-10-CM

## 2023-07-09 DIAGNOSIS — R110 Nausea: Secondary | ICD-10-CM

## 2023-07-09 DIAGNOSIS — F341 Dysthymic disorder: Secondary | ICD-10-CM

## 2023-07-09 DIAGNOSIS — N1832 Chronic kidney disease, stage 3b: Secondary | ICD-10-CM | POA: Diagnosis not present

## 2023-07-09 MED ORDER — LISINOPRIL 5 MG PO TABS: 7.5000 mg | ORAL_TABLET | Freq: Every day | ORAL | Status: DC

## 2023-07-09 NOTE — Progress Notes (Unsigned)
Dana Eth, DNP, AGNP-c [facility] [address] [place], [place] [address] [phone]   ACUTE VISIT- ESTABLISHED PATIENT  Blood pressure (!) 144/84, pulse (!) 58, weight 162 lb 9.6 oz (73.8 kg).  Subjective:  HPI Dana Finley is a 67 y.o. female presents to day for evaluation of acute concern(s).   [name], with a history of migraines and recent flu-like illness, presented with persistent headaches, nausea, and abdominal discomfort. The headaches were reported to be daily but have since improved to her usual frequency since last seen. The patient has a long-standing history of migraines, which are managed with Imitrex and Zomig.  Approximately four weeks prior to the consultation, the patient experienced a flu-like illness, during which she discontinued all medications. This resulted in an increase in migraine frequency and associated symptoms such as nausea. The patient has since resumed her medications and reports an improvement in symptoms. She restarted sertraline, initially at a half dose for seven days, and then increased to 100mg  daily, which appears to be well-tolerated and effective.  In addition to the migraines, the patient has been experiencing fatigue. She has been trying to increase physical activity, such as walking, but reports feeling tired out. The patient has a history of cardiac issues, including aortic valve stenosis and fused valves, which have been managed with metoprolol and lisinopril. The patient's heart rate is noted to be low, potentially due to the metoprolol.  The patient has expressed concern about the potential impact of high blood pressure on her kidneys.  The patient has had recent imaging, including a CT scan of the head and an echocardiogram, due to the headaches and cardiac history respectively. The results of these tests are pending. The patient also has regular follow-ups with an endocrinologist for management of her thyroid  condition, which is reported to be stable.   ROS negative except for what is listed in HPI. History, Medications, Surgery, SDOH, and Family History reviewed and updated as appropriate.  Objective:  Physical Exam Vitals and nursing note reviewed.  Constitutional:      General: She is not in acute distress.    Appearance: Normal appearance. She is not ill-appearing.  HENT:     Head: Normocephalic.  Eyes:     Pupils: Pupils are equal, round, and reactive to light.  Neck:     Vascular: No carotid bruit.  Cardiovascular:     Rate and Rhythm: Normal rate and regular rhythm.     Pulses: Normal pulses.     Heart sounds: Normal heart sounds.  Pulmonary:     Effort: Pulmonary effort is normal.     Breath sounds: Normal breath sounds.  Musculoskeletal:        General: Normal range of motion.     Cervical back: Normal range of motion.  Skin:    General: Skin is warm.  Neurological:     General: No focal deficit present.     Mental Status: She is alert and oriented to person, place, and time.  Psychiatric:        Mood and Affect: Mood normal.          Assessment & Plan:   Problem List Items Addressed This Visit     ANXIETY DEPRESSION   Depression managed with sertraline. Dose increased to 100 mg daily, which is well-tolerated and effective. - Continue sertraline 100 mg daily      Migraines   Chronic migraines with recent exacerbation due to medication non-compliance and viral illness. Symptoms  have returned to baseline frequency with current treatment. Awaiting CT scan results from November 15th due to radiologist shortage. Preliminary review shows no immediate abnormalities. - Continue current migraine management with Imitrex and Zomig - CT reviewed with no concerning findings and reassuring report after the visit. No changes at this time.       Relevant Medications   lisinopril (ZESTRIL) 5 MG tablet   Essential hypertension - Primary   Blood pressure readings frequently  above target range. Currently on 5 mg lisinopril and metoprolol. Considering increasing lisinopril dose to 7.5 mg. - Increase lisinopril to 7.5 mg daily - Monitor blood pressure at home - Adjust dose further if necessary       Relevant Medications   lisinopril (ZESTRIL) 5 MG tablet   Other Relevant Orders   Vitamin B12   Comprehensive metabolic panel   Iron, TIBC and Ferritin Panel   Lipid panel   VITAMIN D 25 Hydroxy (Vit-D Deficiency, Fractures)   Nausea   Nausea likely secondary to viral illness and medication changes. Symptoms improved with a two-week course of Prilosec. - No further action required unless symptoms recur      H/O aortic valve stenosis   Post aortic valve replacement due to congenital bicuspid valve. Recent cardiology follow-up was normal. Emphasized medication adherence and regular follow-ups to monitor heart function. - Continue routine cardiology follow-ups       Chronic kidney disease, stage 3b (HCC)   Recommend good BP control to reduce risks of worsening kidney function. Monitor labs today.  -Increase lisinopril      Relevant Orders   VITAMIN D 25 Hydroxy (Vit-D Deficiency, Fractures)   Other post infection and related fatigue syndromes   Fatigue possibly multifactorial: recent viral illness, medication changes, and thyroid imbalance. Recent thyroid function tests were normal. Discussed the potential impact of stopping and restarting medications, including thyroid medication, on energy levels. - Evaluate fatigue during upcoming physical - Check iron levels and complete blood count (CBC)          Dana Eth, DNP, AGNP-c

## 2023-07-24 ENCOUNTER — Encounter: Payer: Self-pay | Admitting: Nurse Practitioner

## 2023-07-24 DIAGNOSIS — G9339 Other post infection and related fatigue syndromes: Secondary | ICD-10-CM | POA: Insufficient documentation

## 2023-07-24 DIAGNOSIS — R110 Nausea: Secondary | ICD-10-CM

## 2023-07-24 DIAGNOSIS — Z8679 Personal history of other diseases of the circulatory system: Secondary | ICD-10-CM | POA: Insufficient documentation

## 2023-07-24 HISTORY — DX: Nausea: R11.0

## 2023-07-24 NOTE — Assessment & Plan Note (Signed)
Nausea likely secondary to viral illness and medication changes. Symptoms improved with a two-week course of Prilosec. - No further action required unless symptoms recur

## 2023-07-24 NOTE — Assessment & Plan Note (Signed)
Depression managed with sertraline. Dose increased to 100 mg daily, which is well-tolerated and effective. - Continue sertraline 100 mg daily

## 2023-07-24 NOTE — Assessment & Plan Note (Signed)
Post aortic valve replacement due to congenital bicuspid valve. Recent cardiology follow-up was normal. Emphasized medication adherence and regular follow-ups to monitor heart function. - Continue routine cardiology follow-ups

## 2023-07-24 NOTE — Assessment & Plan Note (Signed)
Blood pressure readings frequently above target range. Currently on 5 mg lisinopril and metoprolol. Considering increasing lisinopril dose to 7.5 mg. - Increase lisinopril to 7.5 mg daily - Monitor blood pressure at home - Adjust dose further if necessary

## 2023-07-24 NOTE — Assessment & Plan Note (Signed)
Recommend good BP control to reduce risks of worsening kidney function. Monitor labs today.  -Increase lisinopril

## 2023-07-24 NOTE — Assessment & Plan Note (Signed)
Fatigue possibly multifactorial: recent viral illness, medication changes, and thyroid imbalance. Recent thyroid function tests were normal. Discussed the potential impact of stopping and restarting medications, including thyroid medication, on energy levels. - Evaluate fatigue during upcoming physical - Check iron levels and complete blood count (CBC)

## 2023-07-24 NOTE — Assessment & Plan Note (Signed)
Chronic migraines with recent exacerbation due to medication non-compliance and viral illness. Symptoms have returned to baseline frequency with current treatment. Awaiting CT scan results from November 15th due to radiologist shortage. Preliminary review shows no immediate abnormalities. - Continue current migraine management with Imitrex and Zomig - CT reviewed with no concerning findings and reassuring report after the visit. No changes at this time.

## 2023-07-28 ENCOUNTER — Other Ambulatory Visit: Payer: Self-pay

## 2023-07-28 MED ORDER — METOPROLOL TARTRATE 25 MG PO TABS: 25.0000 mg | ORAL_TABLET | Freq: Two times a day (BID) | ORAL | 3 refills | Status: DC

## 2023-08-07 ENCOUNTER — Other Ambulatory Visit: Payer: Self-pay | Admitting: Nurse Practitioner

## 2023-08-07 DIAGNOSIS — F909 Attention-deficit hyperactivity disorder, unspecified type: Secondary | ICD-10-CM

## 2023-08-07 MED ORDER — AMPHETAMINE-DEXTROAMPHETAMINE 30 MG PO TABS: 30.0000 mg | ORAL_TABLET | Freq: Two times a day (BID) | ORAL | 0 refills | Status: DC

## 2023-08-07 NOTE — Telephone Encounter (Signed)
Refills sent for 3 months

## 2023-08-12 ENCOUNTER — Other Ambulatory Visit: Payer: Self-pay | Admitting: Nurse Practitioner

## 2023-08-18 LAB — LIPID PANEL
Chol/HDL Ratio: 3 {ratio} (ref 0.0–4.4)
Cholesterol, Total: 216 mg/dL — ABNORMAL HIGH (ref 100–199)
HDL: 73 mg/dL (ref >39)
LDL Chol Calc (NIH): 108 mg/dL — ABNORMAL HIGH (ref 0–99)
Triglycerides: 207 mg/dL — ABNORMAL HIGH (ref 0–149)
VLDL Cholesterol Cal: 35 mg/dL (ref 5–40)

## 2023-08-18 LAB — COMPREHENSIVE METABOLIC PANEL
ALT: 9 [IU]/L (ref 0–32)
AST: 23 [IU]/L (ref 0–40)
Albumin: 4.6 g/dL (ref 3.9–4.9)
Alkaline Phosphatase: 67 [IU]/L (ref 44–121)
BUN/Creatinine Ratio: 21 (ref 12–28)
BUN: 21 mg/dL (ref 8–27)
Bilirubin Total: 0.3 mg/dL (ref 0.0–1.2)
CO2: 19 mmol/L — ABNORMAL LOW (ref 20–29)
Calcium: 9.5 mg/dL (ref 8.7–10.3)
Chloride: 102 mmol/L (ref 96–106)
Creatinine, Ser: 1.01 mg/dL — ABNORMAL HIGH (ref 0.57–1.00)
Globulin, Total: 2.2 g/dL (ref 1.5–4.5)
Glucose: 99 mg/dL (ref 70–99)
Potassium: 4.3 mmol/L (ref 3.5–5.2)
Sodium: 143 mmol/L (ref 134–144)
Total Protein: 6.8 g/dL (ref 6.0–8.5)
eGFR: 61 mL/min/{1.73_m2} (ref >59)

## 2023-08-18 LAB — VITAMIN D 25 HYDROXY (VIT D DEFICIENCY, FRACTURES)

## 2023-08-18 LAB — VITAMIN B12: Vitamin B-12: 480 pg/mL (ref 232–1245)

## 2023-08-18 LAB — IRON,TIBC AND FERRITIN PANEL
Ferritin: 163 ng/mL — ABNORMAL HIGH (ref 15–150)
Iron Saturation: 42 % (ref 15–55)
Iron: 123 ug/dL (ref 27–139)
Total Iron Binding Capacity: 293 ug/dL (ref 250–450)
UIBC: 170 ug/dL (ref 118–369)

## 2023-08-26 ENCOUNTER — Other Ambulatory Visit (HOSPITAL_BASED_OUTPATIENT_CLINIC_OR_DEPARTMENT_OTHER): Payer: Self-pay

## 2023-09-04 ENCOUNTER — Ambulatory Visit: Payer: Medicare Other | Admitting: Nurse Practitioner

## 2023-09-04 VITALS — BP 124/80 | HR 71 | Ht 64.0 in | Wt 161.8 lb

## 2023-09-04 DIAGNOSIS — F341 Dysthymic disorder: Secondary | ICD-10-CM

## 2023-09-04 DIAGNOSIS — N1832 Chronic kidney disease, stage 3b: Secondary | ICD-10-CM

## 2023-09-04 DIAGNOSIS — I10 Essential (primary) hypertension: Secondary | ICD-10-CM

## 2023-09-04 DIAGNOSIS — E782 Mixed hyperlipidemia: Secondary | ICD-10-CM | POA: Diagnosis not present

## 2023-09-04 MED ORDER — SERTRALINE HCL 25 MG PO TABS: ORAL_TABLET | ORAL | 0 refills | Status: DC

## 2023-09-04 MED ORDER — SERTRALINE HCL 100 MG PO TABS: 100.0000 mg | ORAL_TABLET | Freq: Every day | ORAL | 1 refills | Status: DC

## 2023-09-04 MED ORDER — LISINOPRIL 10 MG PO TABS: 10.0000 mg | ORAL_TABLET | Freq: Every day | ORAL | 3 refills | Status: DC

## 2023-09-04 NOTE — Assessment & Plan Note (Signed)
Steady on 150mg  of sertraline a day. Desire for decrease in dosage due to lack of emotion. Will trial down to 125mg  a day and monitor for symptom changes. We can plan to adjust based on her response accordingly after at least 2 weeks on the new dose.  - 25mg  sertraline sent to the pharmacy. Take this with 100mg  tablet once a day.  - Stay on this dose for at least 2 weeks and then we can consider readjustment. - If your mood significantly worsens after changing the dose, let me know immediately.

## 2023-09-04 NOTE — Patient Instructions (Signed)
-   25mg  sertraline sent to the pharmacy. Take this with 100mg  tablet once a day.  - Stay on this dose for at least 2 weeks and then we can consider readjustment. - If your mood significantly worsens after changing the dose, let me know immediately.

## 2023-09-04 NOTE — Progress Notes (Signed)
[name], DNP, AGNP-c [facility]  [address] [place], [place] [address] [phone]  ESTABLISHED PATIENT- Chronic Health and/or Follow-Up Visit  Blood pressure 124/80, pulse 71, height 5\' 4"  (1.626 m), weight 161 lb 12.8 oz (73.4 kg).    Dana Finley is a 68 y.o. year old female presenting today for evaluation and management of chronic conditions.   History of Present Illness Dana Finley presents for a routine follow-up and medication review. She reports feeling persistently tired and sluggish, despite adherence to prescribed medications, including metoprolol and sertraline. The fatigue does not appear to limit her daily activities, including regular exercise, but is a cause for concern. The patient also reports occasional headaches, which have improved since resuming her regular medication regimen.  The patient has been on sertraline for depression, but recently expressed a desire to taper off due to concerns about fatigue. She had previously stopped taking all medications abruptly, which resulted in severe rebound symptoms, including headaches, nausea, and abdominal discomfort. The patient has since resumed sertraline at a lower dose of 150mg  daily, down from the previous 200mg  daily. She also takes Adderall, which she reports helps with her energy levels.  In addition to the above, the patient has a history of hypertension, managed with lisinopril. The dosage was recently increased from 5mg  to 10mg  daily due to persistently elevated blood pressure readings. Since the adjustment, the patient's blood pressure readings have improved and are now consistently within the normal range.  The patient also has a history of kidney dysfunction, classified as stage 3. Despite this, recent lab results indicate that her kidney function has remained stable, with a glomerular filtration rate (GFR) consistently above 60. The patient's cholesterol levels are slightly elevated, but her  cardiovascular risk is considered less than half the average risk for her age and gender.  The patient's medication regimen also includes trazodone for sleep. Despite this, she reports difficulty sleeping at night, often waking up to use the bathroom but is able to fall back asleep quickly. She estimates she gets about eight hours of sleep per night. The patient has expressed a desire to potentially reduce her metoprolol dosage due to its potential contribution to her fatigue.  All ROS negative with exception of what is listed above.   PHYSICAL EXAM Physical Exam Vitals and nursing note reviewed.  Constitutional:      Appearance: Normal appearance.  HENT:     Head: Normocephalic.  Eyes:     Pupils: Pupils are equal, round, and reactive to light.  Cardiovascular:     Rate and Rhythm: Normal rate and regular rhythm.     Pulses: Normal pulses.     Heart sounds: Normal heart sounds.  Pulmonary:     Effort: Pulmonary effort is normal.     Breath sounds: Normal breath sounds.  Musculoskeletal:        General: Normal range of motion.     Cervical back: Normal range of motion.  Skin:    General: Skin is warm.  Neurological:     General: No focal deficit present.     Mental Status: She is alert and oriented to person, place, and time.  Psychiatric:        Mood and Affect: Mood normal.      PLAN Problem List Items Addressed This Visit     Essential hypertension   Hypertension, well-controlled with lisinopril 10 mg daily. Blood pressure stable around 120-124 mmHg. Stable blood pressure is crucial for managing CKD and cardiovascular health. -  Continue lisinopril 10 mg daily. - Monitor blood pressure regularly.      Relevant Medications   lisinopril (ZESTRIL) 10 MG tablet   Mixed hyperlipidemia   Mildly elevated LDL cholesterol. Total cholesterol slightly high, but HDL and triglycerides within acceptable ranges. Low cardiovascular risk based on current lipid profile. LDL should be  less than 100 mg/dL; cardiovascular event risk is less than half the average based on a ratio of 3.0. - Continue current diet and lifestyle. - Recheck lipid panel at next annual visit.      Relevant Medications   lisinopril (ZESTRIL) 10 MG tablet   Chronic kidney disease, stage 3b (HCC)   CKD Stage 3, well-managed. GFR consistently around 60-61, normal for age. Creatinine levels have normalized. CKD Stage 3 is common in individuals over 65 due to long-term kidney function decline. - Monitor kidney function with regular labs. - Discuss any sudden changes in kidney function.      ANXIETY DEPRESSION - Primary   Steady on 150mg  of sertraline a day. Desire for decrease in dosage due to lack of emotion. Will trial down to 125mg  a day and monitor for symptom changes. We can plan to adjust based on her response accordingly after at least 2 weeks on the new dose.  - 25mg  sertraline sent to the pharmacy. Take this with 100mg  tablet once a day.  - Stay on this dose for at least 2 weeks and then we can consider readjustment. - If your mood significantly worsens after changing the dose, let me know immediately.       Relevant Medications   sertraline (ZOLOFT) 25 MG tablet   sertraline (ZOLOFT) 100 MG tablet    Return in about 6 weeks (around 10/16/2023) for virtual med check sertraline.  SaraBeth Mekenzie Modeste, DNP, AGNP-c  Time: 56 minutes, >50% spent counseling, care coordination, chart review, and documentation.

## 2023-09-10 ENCOUNTER — Encounter: Payer: Self-pay | Admitting: Nurse Practitioner

## 2023-09-10 NOTE — Assessment & Plan Note (Signed)
CKD Stage 3, well-managed. GFR consistently around 60-61, normal for age. Creatinine levels have normalized. CKD Stage 3 is common in individuals over 65 due to long-term kidney function decline. - Monitor kidney function with regular labs. - Discuss any sudden changes in kidney function.

## 2023-09-10 NOTE — Assessment & Plan Note (Signed)
Mildly elevated LDL cholesterol. Total cholesterol slightly high, but HDL and triglycerides within acceptable ranges. Low cardiovascular risk based on current lipid profile. LDL should be less than 100 mg/dL; cardiovascular event risk is less than half the average based on a ratio of 3.0. - Continue current diet and lifestyle. - Recheck lipid panel at next annual visit.

## 2023-09-10 NOTE — Assessment & Plan Note (Signed)
Hypertension, well-controlled with lisinopril 10 mg daily. Blood pressure stable around 120-124 mmHg. Stable blood pressure is crucial for managing CKD and cardiovascular health. - Continue lisinopril 10 mg daily. - Monitor blood pressure regularly.

## 2023-10-04 ENCOUNTER — Other Ambulatory Visit: Payer: Self-pay | Admitting: Nurse Practitioner

## 2023-10-06 ENCOUNTER — Other Ambulatory Visit: Payer: Self-pay | Admitting: Nurse Practitioner

## 2023-10-06 ENCOUNTER — Encounter: Payer: Self-pay | Admitting: Nurse Practitioner

## 2023-10-06 NOTE — Telephone Encounter (Signed)
Alternative requested

## 2023-10-07 ENCOUNTER — Other Ambulatory Visit: Payer: Self-pay

## 2023-10-16 ENCOUNTER — Telehealth: Payer: Medicare Other | Admitting: Nurse Practitioner

## 2023-11-13 ENCOUNTER — Encounter: Payer: Self-pay | Admitting: Nurse Practitioner

## 2023-11-13 ENCOUNTER — Ambulatory Visit: Admitting: Nurse Practitioner

## 2023-11-13 VITALS — BP 130/82 | HR 72 | Wt 158.2 lb

## 2023-11-13 DIAGNOSIS — I471 Supraventricular tachycardia, unspecified: Secondary | ICD-10-CM

## 2023-11-13 DIAGNOSIS — F900 Attention-deficit hyperactivity disorder, predominantly inattentive type: Secondary | ICD-10-CM

## 2023-11-13 DIAGNOSIS — F909 Attention-deficit hyperactivity disorder, unspecified type: Secondary | ICD-10-CM | POA: Diagnosis not present

## 2023-11-13 DIAGNOSIS — F341 Dysthymic disorder: Secondary | ICD-10-CM | POA: Diagnosis not present

## 2023-11-13 DIAGNOSIS — L708 Other acne: Secondary | ICD-10-CM | POA: Insufficient documentation

## 2023-11-13 DIAGNOSIS — M270 Developmental disorders of jaws: Secondary | ICD-10-CM

## 2023-11-13 HISTORY — DX: Other acne: L70.8

## 2023-11-13 MED ORDER — CLINDAMYCIN PHOSPHATE 1 % EX GEL: Freq: Two times a day (BID) | CUTANEOUS | 0 refills | Status: DC

## 2023-11-13 MED ORDER — AMPHETAMINE-DEXTROAMPHETAMINE 30 MG PO TABS: 30.0000 mg | ORAL_TABLET | Freq: Two times a day (BID) | ORAL | 0 refills | Status: DC

## 2023-11-13 NOTE — Assessment & Plan Note (Signed)
 Refills provided for 90 day supply. No concerns present at this time. Stable on current dose.

## 2023-11-13 NOTE — Assessment & Plan Note (Signed)
 Currently on sertraline 100 mg for depression. Dose reduction to 50 mg led to migraines, so increased back to 100 mg. She reports emotional stability at this dose.   - Continue sertraline 100 mg daily

## 2023-11-13 NOTE — Progress Notes (Signed)
 [name], DNP, AGNP-c [facility]  [address] [place], [place] [address] [phone]  ESTABLISHED PATIENT- Chronic Health and/or Follow-Up Visit  Blood pressure 130/82, pulse 72, weight 158 lb 3.2 oz (71.8 kg).    Dana Finley is a 68 y.o. year old female presenting today for evaluation and management of chronic conditions.   History of Present Illness Dana Finley is a 68 year old female who presents with concerns about oral lumps and medication management. She is accompanied by her husband.  She has developed hard lumps on her gums, described as 'bone', present for over a month. These lumps are located on the outside of her teeth on the gum. Her gums feel sore when using a water pick, but the lumps are not painful, and the mucous membranes are intact. Her dentist indicated that the lumps are bone and not a cause for concern, but she seeks further evaluation as they have recently appeared and are not resolving.  She was previously attempting to taper her sertraline down due to lack of emotion. She has a history of migraines that were exacerbated when her sertraline dose was reduced below 100mg . She made the decision to increase her dose back to 100mg , which is lower than her previous dose of 150 mg, but she finds this dose preferable as it prevents daily migraines. She feels 'level' emotionally on this dose.  She experiences daily fatigue, which she attributes to her current medication regimen, including metoprolol. She is open to discussing alternative medications with her cardiologist to address this issue.  She has a skin concern involving a persistent bump with whiteheads on her right chin that she has been picking at, causing it to bleed. The bump has been present for a while, and she has recently noticed more small white bumps around it.  All ROS negative with exception of what is listed above.   PHYSICAL EXAM Physical Exam Vitals and nursing note  reviewed.  Constitutional:      General: She is not in acute distress.    Appearance: Normal appearance. She is not ill-appearing.  HENT:     Head:      Comments: 1cm area of erythema with evidence of deep pustule surrounded by several sub mm white papules.  Eyes:     Conjunctiva/sclera: Conjunctivae normal.  Neck:     Vascular: No carotid bruit.  Cardiovascular:     Rate and Rhythm: Normal rate and regular rhythm.     Pulses: Normal pulses.     Heart sounds: Normal heart sounds.  Pulmonary:     Effort: Pulmonary effort is normal.     Breath sounds: Normal breath sounds.  Abdominal:     General: Bowel sounds are normal.     Palpations: Abdomen is soft.  Skin:    General: Skin is warm and dry.     Capillary Refill: Capillary refill takes less than 2 seconds.  Neurological:     General: No focal deficit present.     Mental Status: She is alert.  Psychiatric:        Mood and Affect: Mood normal.        Behavior: Behavior normal.      PLAN Problem List Items Addressed This Visit     ANXIETY DEPRESSION - Primary   Currently on sertraline 100 mg for depression. Dose reduction to 50 mg led to migraines, so increased back to 100 mg. She reports emotional stability at this dose.   - Continue sertraline  100 mg daily        ADD (attention deficit disorder)   Refills provided for 90 day supply. No concerns present at this time. Stable on current dose.       SVT (supraventricular tachycardia) (HCC)   Daily fatigue potentially related to metoprolol use. Consideration of switching to propranolol, which may cause less fatigue.   - Discuss with cardiologist about switching from metoprolol to propranolol        Torus mandibularis   Hard lumps on the gums, identified as benign mandibular tori, causing concern for the new change. Reassurance provided that this can occur for a variety of reasons, and they pose no health risk unless significant changes occur.  - Monitor for changes in  size or symptoms   - Return for evaluation if lumps grow rapidly or interfere with oral function        Other acne   Persistent facial bump with small whiteheads, exacerbated by manipulation, leading to bacterial infection.   - Prescribe clindamycin gel for topical application         Relevant Medications   clindamycin (CLINDAGEL) 1 % gel   Other Visit Diagnoses       Attention deficit disorder with hyperactivity       Relevant Medications   amphetamine-dextroamphetamine (ADDERALL) 30 MG tablet (Start on 12/15/2023)       Return if symptoms worsen or fail to improve.  [name], DNP, AGNP-c

## 2023-11-13 NOTE — Assessment & Plan Note (Signed)
 Persistent facial bump with small whiteheads, exacerbated by manipulation, leading to bacterial infection.   - Prescribe clindamycin gel for topical application

## 2023-11-13 NOTE — Assessment & Plan Note (Signed)
 Hard lumps on the gums, identified as benign mandibular tori, causing concern for the new change. Reassurance provided that this can occur for a variety of reasons, and they pose no health risk unless significant changes occur.  - Monitor for changes in size or symptoms   - Return for evaluation if lumps grow rapidly or interfere with oral function

## 2023-11-13 NOTE — Assessment & Plan Note (Signed)
 Daily fatigue potentially related to metoprolol use. Consideration of switching to propranolol, which may cause less fatigue.   - Discuss with cardiologist about switching from metoprolol to propranolol

## 2023-11-25 ENCOUNTER — Other Ambulatory Visit: Payer: Self-pay | Admitting: Nurse Practitioner

## 2023-11-25 DIAGNOSIS — F341 Dysthymic disorder: Secondary | ICD-10-CM

## 2024-01-21 ENCOUNTER — Encounter: Payer: Medicare Other | Admitting: Family Medicine

## 2024-02-19 ENCOUNTER — Ambulatory Visit

## 2024-02-19 DIAGNOSIS — Z0000 Encounter for general adult medical examination without abnormal findings: Secondary | ICD-10-CM | POA: Diagnosis not present

## 2024-02-19 NOTE — Patient Instructions (Signed)
 Dana Finley , Thank you for taking time out of your busy schedule to complete your Annual Wellness Visit with me. I enjoyed our conversation and look forward to speaking with you again next year. I, as well as your care team,  appreciate your ongoing commitment to your health goals. Please review the following plan we discussed and let me know if I can assist you in the future. Your Game plan/ To Do List    Referrals: If you haven't heard from the office you've been referred to, please reach out to them at the phone provided.  N/a Follow up Visits: Next Medicare AWV with our clinical staff: 03/01/2025 at 2:50   Have you seen your provider in the last 6 months (3 months if uncontrolled diabetes)? Yes Next Office Visit with your provider: 09/09/2024 at 2:00  Clinician Recommendations:  Aim for 30 minutes of exercise or brisk walking, 6-8 glasses of water, and 5 servings of fruits and vegetables each day.       This is a list of the screening recommended for you and due dates:  Health Maintenance  Topic Date Due   Mammogram  12/15/2023   COVID-19 Vaccine (8 - 2024-25 season) 12/23/2023   Flu Shot  03/12/2024   Medicare Annual Wellness Visit  02/18/2025   Colon Cancer Screening  07/20/2025   DTaP/Tdap/Td vaccine (3 - Td or Tdap) 02/27/2032   Pneumococcal Vaccine for age over 59  Completed   DEXA scan (bone density measurement)  Completed   Zoster (Shingles) Vaccine  Completed   Hepatitis B Vaccine  Aged Out   HPV Vaccine  Aged Out   Meningitis B Vaccine  Aged Out   Hepatitis C Screening  Discontinued    Advanced directives: (Copy Requested) Please bring a copy of your health care power of attorney and living will to the office to be added to your chart at your convenience. You can mail to [facility] [address] [place], [place] [address] or email to [email or web address] Advance Care Planning is important because it:  [x]  Makes sure you receive the medical care  that is consistent with your values, goals, and preferences  [x]  It provides guidance to your family and loved ones and reduces their decisional burden about whether or not they are making the right decisions based on your wishes.  Follow the link provided in your after visit summary or read over the paperwork we have mailed to you to help you started getting your Advance Directives in place. If you need assistance in completing these, please reach out to us  so that we can help you!  See attachments for Preventive Care and Fall Prevention Tips.

## 2024-02-19 NOTE — Progress Notes (Signed)
 Subjective:   Dana Finley is a 68 y.o. who presents for a Medicare Wellness preventive visit.  As a reminder, Annual Wellness Visits don't include a physical exam, and some assessments may be limited, especially if this visit is performed virtually. We may recommend an in-person follow-up visit with your provider if needed.  Visit Complete: Virtual I connected with  Dana Finley on 02/19/24 by a video and audio enabled telemedicine application and verified that I am speaking with the correct person using two identifiers.  Patient Location: Home  Provider Location: Home Office  I discussed the limitations of evaluation and management by telemedicine. The patient expressed understanding and agreed to proceed.  Vital Signs: Because this visit was a virtual/telehealth visit, some criteria may be missing or patient reported. Any vitals not documented were not able to be obtained and vitals that have been documented are patient reported.    Persons Participating in Visit: Patient assisted by spouse.  AWV Questionnaire: Yes: Patient Medicare AWV questionnaire was completed by the patient on 02/19/2024; I have confirmed that all information answered by patient is correct and no changes since this date.  Cardiac Risk Factors include: advanced age (>25men, >38 women);hypertension     Objective:    Today's Vitals   There is no height or weight on file to calculate BMI.     02/19/2024    2:58 PM 02/13/2022    1:28 PM  Advanced Directives  Does Patient Have a Medical Advance Directive? Yes Yes  Type of Estate agent of [place];Living will Healthcare Power of [place];Living will  Copy of Healthcare Power of Attorney in Chart? No - copy requested     Current Medications (verified) Outpatient Encounter Medications as of 02/19/2024  Medication Sig   amphetamine -dextroamphetamine  (ADDERALL) 30 MG tablet Take 1 tablet by mouth 2 (two) times daily.   levothyroxine  (SYNTHROID, LEVOTHROID) 25 MCG tablet Take 25 mcg by mouth daily.    lisinopril  (ZESTRIL ) 10 MG tablet Take 1 tablet (10 mg total) by mouth daily.   metoprolol  tartrate (LOPRESSOR ) 25 MG tablet Take 1 tablet (25 mg total) by mouth 2 (two) times daily.   Multiple Vitamins-Minerals (PRESERVISION AREDS 2 PO) Take by mouth.   pravastatin  (PRAVACHOL ) 40 MG tablet Take 1 tablet (40 mg total) by mouth daily.   sertraline  (ZOLOFT ) 100 MG tablet Take 1 tablet (100 mg total) by mouth daily. Please do not fill until requested   traZODone  (DESYREL ) 150 MG tablet TAKE 1-2 TABLETS BY MOUTH AT BEDTIME AS NEEDED.   zolmitriptan  (ZOMIG ) 5 MG tablet Take 5 mg by mouth as needed.   clindamycin  (CLINDAGEL) 1 % gel Apply topically 2 (two) times daily. (Patient not taking: Reported on 02/19/2024)   rizatriptan (MAXALT-MLT) 10 MG disintegrating tablet Take 1 tablet (10 mg total) by mouth as needed for migraine. May repeat once if no improvement after 2 hours. No more than 2 doses in 24 hours. (Patient not taking: Reported on 02/19/2024)   SUMAtriptan  (IMITREX ) 6 MG/0.5ML SOLN injection Inject 6 mg into the skin every 2 (two) hours as needed for migraine or headache. May repeat in 2 hours if headache persists or recurs. (Patient not taking: Reported on 02/19/2024)   No facility-administered encounter medications on file as of 02/19/2024.    Allergies (verified) Codeine and Morphine and codeine   History: Past Medical History:  Diagnosis Date   ACHILLES TENDINITIS 12/19/2009   Qualifier: Diagnosis of  By: Viviann Raddle MD, Marsha SAUNDERS  Acute upper respiratory infection 05/31/2016   ADD (attention deficit disorder)    Allergy    Codeine based medications   Anxiety    Arthritis    Cataract    Congenital   Chalazion of right lower eyelid 10/08/2012   Chronic kidney disease    Treated by Dr. Gearline   COLONIC POLYPS, HYPERPLASTIC 11/19/2005   Qualifier: Diagnosis of  By: Derrek Ramp     Depression    DISC  DISEASE, LUMBOSACRAL SPINE 12/07/2009   Qualifier: Diagnosis of  By: Viviann Raddle MD, Willie R    Diverticula of colon    Easy bruising 11/27/2022   Elevated alkaline phosphatase level 11/27/2022   Fall 12/18/2021   Heart murmur    Hemorrhoids    High serum vitamin B12 11/27/2022   History of colonic polyps hyperplastic   Hypertension    Hypothyroidism    IBS (irritable bowel syndrome)    Insomnia, persistent    Leg heaviness 01/09/2022   Migraine headache without aura    lumbosacral disc desease   Nausea 07/24/2023   NUMBNESS 12/27/2009   Qualifier: Diagnosis of   By: Viviann Raddle MD, Willie R        Osteopenia    OSTEOPENIA 06/30/2007   Qualifier: Diagnosis of  By: Viviann Raddle MD, Willie R    Pain of left hip joint 09/05/2021   RHINITIS 12/27/2009   Qualifier: Diagnosis of  By: Viviann Raddle MD, Marsha SAUNDERS    Serrated adenoma of colon 06/2012   Skin lesion 11/26/2021   Weight loss    WEIGHT LOSS 08/20/2007   Qualifier: Diagnosis of  By: Viviann Raddle MD, Marsha SAUNDERS    Wellness examination 02/26/2022   Past Surgical History:  Procedure Laterality Date   ABDOMINAL HYSTERECTOMY     1996   APPENDECTOMY     CATARACT EXTRACTION     bilateral   COLONOSCOPY     EYE SURGERY     muscle surgery; has had approx 18 surgeries on both eyes   POLYPECTOMY     Family History  Problem Relation Age of Onset   Diabetes Mother    Heart disease Mother    Stroke Mother    Hypertension Mother    Early death Sister    Colon cancer Sister 72   Cancer Sister    Pancreatic cancer Father 51   Cancer Father    Stomach cancer Maternal Uncle 8   Leukemia Brother    Cancer Brother    Colon cancer Cousin 50   Colon cancer Maternal Uncle 60   Colon polyps Son    ADD / ADHD Brother    Rectal cancer Neg Hx    Social History   Socioeconomic History   Marital status: Married    Spouse name: Not on file   Number of children: 2   Years of education: Not on file   Highest education level: 12th  grade  Occupational History   Occupation: home    Employer: UNEMPLOYED  Tobacco Use   Smoking status: Former    Current packs/day: 0.00    Types: Cigarettes    Quit date: 06/12/1977    Years since quitting: 46.7   Smokeless tobacco: Never  Vaping Use   Vaping status: Never Used  Substance and Sexual Activity   Alcohol use: Not Currently    Alcohol/week: 1.0 standard drink of alcohol    Types: 1 Standard drinks or equivalent per week    Comment: occasional  Drug use: No   Sexual activity: Not Currently    Birth control/protection: Injection, None  Other Topics Concern   Not on file  Social History Narrative   Not on file   Social Drivers of Health   Financial Resource Strain: Low Risk  (02/19/2024)   Overall Financial Resource Strain (CARDIA)    Difficulty of Paying Living Expenses: Not hard at all  Food Insecurity: No Food Insecurity (02/19/2024)   Hunger Vital Sign    Worried About Running Out of Food in the Last Year: Never true    Ran Out of Food in the Last Year: Never true  Transportation Needs: No Transportation Needs (02/19/2024)   PRAPARE - Administrator, Civil Service (Medical): No    Lack of Transportation (Non-Medical): No  Physical Activity: Sufficiently Active (02/19/2024)   Exercise Vital Sign    Days of Exercise per Week: 3 days    Minutes of Exercise per Session: 60 min  Stress: No Stress Concern Present (02/19/2024)   [employer] of Occupational Health - Occupational Stress Questionnaire    Feeling of Stress: Only a little  Social Connections: Moderately Integrated (02/19/2024)   Social Connection and Isolation Panel    Frequency of Communication with Friends and Family: More than three times a week    Frequency of Social Gatherings with Friends and Family: Once a week    Attends Religious Services: 1 to 4 times per year    Active Member of [employer] or Organizations: No    Attends Engineer, structural: Not on file    Marital  Status: Married    Tobacco Counseling Counseling given: Not Answered    Clinical Intake:  Pre-visit preparation completed: Yes  Pain : No/denies pain     Nutritional Risks: None Diabetes: No  No results found for: HGBA1C   How often do you need to have someone help you when you read instructions, pamphlets, or other written materials from your doctor or pharmacy?: 1 - Never  Interpreter Needed?: No  Information entered by :: NAllen LPN   Activities of Daily Living     02/19/2024    2:53 PM  In your present state of health, do you have any difficulty performing the following activities:  Hearing? 0  Vision? 0  Difficulty concentrating or making decisions? 0  Walking or climbing stairs? 0  Dressing or bathing? 0  Doing errands, shopping? 0  Preparing Food and eating ? N  Using the Toilet? N  In the past six months, have you accidently leaked urine? N  Do you have problems with loss of bowel control? N  Managing your Medications? N  Managing your Finances? N  Housekeeping or managing your Housekeeping? N    Patient Care Team: Early, Camie BRAVO, NP as PCP - General (Nurse Practitioner) Hobart Powell BRAVO, MD (Inactive) as PCP - Cardiology (Cardiology) Margrette Leigh RAMAN, PT as Physical Therapist (Physical Therapy)  I have updated your Care Teams any recent Medical Services you may have received from other providers in the past year.     Assessment:   This is a routine wellness examination for [place].  Hearing/Vision screen Hearing Screening - Comments:: Denies hearing issues Vision Screening - Comments:: Regular eye exams, [facility]   Goals Addressed             This Visit's Progress    Patient Stated       02/19/2024, wants to lose weight  Depression Screen     02/19/2024    3:00 PM 11/13/2023   11:27 AM 09/04/2023   10:18 AM 11/19/2022    4:21 PM 02/26/2022   11:13 AM 01/09/2022   10:02 AM 12/18/2021    1:31 PM  PHQ 2/9 Scores  PHQ - 2  Score 0 0 0 0 0 0 0  PHQ- 9 Score 0 3   0    Exception Documentation     Medical reason Medical reason Medical reason    Fall Risk     02/19/2024    2:59 PM 11/13/2023   11:26 AM 09/04/2023   10:17 AM 11/19/2022    4:22 PM 06/06/2022   11:46 AM  Fall Risk   Falls in the past year? 0 0 0 0 0  Number falls in past yr: 0 0 0 0 0  Injury with Fall? 0 0 0 0 0  Risk for fall due to : Medication side effect No Fall Risks  No Fall Risks No Fall Risks  Follow up Falls evaluation completed;Falls prevention discussed Falls evaluation completed Falls prevention discussed;Follow up appointment Falls prevention discussed;Education provided;Falls evaluation completed Falls evaluation completed      Data saved with a previous flowsheet row definition    MEDICARE RISK AT HOME:  Medicare Risk at Home Any stairs in or around the home?: Yes If so, are there any without handrails?: No Home free of loose throw rugs in walkways, pet beds, electrical cords, etc?: Yes Adequate lighting in your home to reduce risk of falls?: Yes Life alert?: No Use of a cane, walker or w/c?: No Grab bars in the bathroom?: No Shower chair or bench in shower?: No Elevated toilet seat or a handicapped toilet?: No  TIMED UP AND GO:  Was the test performed?  No  Cognitive Function: 6CIT completed        02/19/2024    3:00 PM 11/19/2022    4:23 PM 06/06/2022   11:47 AM  6CIT Screen  What Year? 0 points 0 points 0 points  What month? 0 points 0 points 0 points  What time? 0 points 0 points 0 points  Count back from 20 0 points 0 points 0 points  Months in reverse 4 points 0 points 0 points  Repeat phrase 2 points 0 points 0 points  Total Score 6 points 0 points 0 points    Immunizations Immunization History  Administered Date(s) Administered   Fluad  Quad(high Dose 65+) 07/19/2021   Fluad  Trivalent(High Dose 65+) 06/25/2023   Influenza Inj Mdck Quad Pf 06/28/2017, 05/16/2018, 04/21/2019   Influenza, High Dose  Seasonal PF 04/21/2019   Influenza,inj,Quad PF,6+ Mos 06/28/2017   Influenza-Unspecified 07/12/2022   PFIZER(Purple Top)SARS-COV-2 Vaccination 10/28/2019, 11/22/2019, 12/16/2020   PNEUMOCOCCAL CONJUGATE-20 02/26/2022   Pfizer Covid-19 Vaccine Bivalent Booster 39yrs & up 07/27/2021   Pfizer(Comirnaty )Fall Seasonal Vaccine 12 years and older 07/09/2022, 02/05/2023, 06/25/2023   Respiratory Syncytial Virus Vaccine ,Recomb Aduvanted(Arexvy ) 07/25/2022   Tdap 08/13/2010, 02/26/2022   Zoster Recombinant(Shingrix) 04/20/2017, 09/06/2017   Zoster, Live 12/22/2014    Screening Tests Health Maintenance  Topic Date Due   MAMMOGRAM  12/15/2023   COVID-19 Vaccine (8 - 2024-25 season) 12/23/2023   INFLUENZA VACCINE  03/12/2024   Medicare Annual Wellness (AWV)  02/18/2025   Colonoscopy  07/20/2025   DTaP/Tdap/Td (3 - Td or Tdap) 02/27/2032   Pneumococcal Vaccine: 50+ Years  Completed   DEXA SCAN  Completed   Zoster Vaccines- Shingrix  Completed   Hepatitis B  Vaccines  Aged Out   HPV VACCINES  Aged Out   Meningococcal B Vaccine  Aged Out   Hepatitis C Screening  Discontinued    Health Maintenance  Health Maintenance Due  Topic Date Due   MAMMOGRAM  12/15/2023   COVID-19 Vaccine (8 - 2024-25 season) 12/23/2023   Health Maintenance Items Addressed: Patient to schedule mammogram.  Additional Screening:  Vision Screening: Recommended annual ophthalmology exams for early detection of glaucoma and other disorders of the eye. Would you like a referral to an eye doctor? No    Dental Screening: Recommended annual dental exams for proper oral hygiene  Community Resource Referral / Chronic Care Management: CRR required this visit?  No   CCM required this visit?  No   Plan:    I have personally reviewed and noted the following in the patient's chart:   Medical and social history Use of alcohol, tobacco or illicit drugs  Current medications and supplements including opioid  prescriptions. Patient is not currently taking opioid prescriptions. Functional ability and status Nutritional status Physical activity Advanced directives List of other physicians Hospitalizations, surgeries, and ER visits in previous 12 months Vitals Screenings to include cognitive, depression, and falls Referrals and appointments  In addition, I have reviewed and discussed with patient certain preventive protocols, quality metrics, and best practice recommendations. A written personalized care plan for preventive services as well as general preventive health recommendations were provided to patient.   Ardella FORBES Dawn, LPN   [id number]/[id number]   After Visit Summary: (MyChart) Due to this being a telephonic visit, the after visit summary with patients personalized plan was offered to patient via MyChart   Notes: Nothing significant to report at this time.

## 2024-03-11 ENCOUNTER — Other Ambulatory Visit (HOSPITAL_BASED_OUTPATIENT_CLINIC_OR_DEPARTMENT_OTHER): Payer: Self-pay | Admitting: Nurse Practitioner

## 2024-03-11 DIAGNOSIS — Z1231 Encounter for screening mammogram for malignant neoplasm of breast: Secondary | ICD-10-CM

## 2024-03-18 ENCOUNTER — Encounter: Payer: Self-pay | Admitting: Nurse Practitioner

## 2024-03-26 ENCOUNTER — Ambulatory Visit (HOSPITAL_BASED_OUTPATIENT_CLINIC_OR_DEPARTMENT_OTHER)
Admission: RE | Admit: 2024-03-26 | Discharge: 2024-03-26 | Disposition: A | Discharge status: Home / Self Care | Payer: PRIVATE HEALTH INSURANCE | Source: Ambulatory Visit | Attending: Nurse Practitioner | Admitting: Nurse Practitioner

## 2024-03-26 ENCOUNTER — Encounter (HOSPITAL_BASED_OUTPATIENT_CLINIC_OR_DEPARTMENT_OTHER): Payer: Self-pay | Admitting: Radiology

## 2024-03-26 DIAGNOSIS — Z1231 Encounter for screening mammogram for malignant neoplasm of breast: Secondary | ICD-10-CM | POA: Diagnosis present

## 2024-03-31 ENCOUNTER — Ambulatory Visit: Payer: Self-pay | Admitting: Nurse Practitioner

## 2024-04-05 ENCOUNTER — Other Ambulatory Visit (HOSPITAL_BASED_OUTPATIENT_CLINIC_OR_DEPARTMENT_OTHER): Payer: Self-pay

## 2024-04-05 ENCOUNTER — Encounter: Payer: Self-pay | Admitting: Nurse Practitioner

## 2024-04-05 ENCOUNTER — Other Ambulatory Visit: Payer: Self-pay

## 2024-04-05 DIAGNOSIS — I10 Essential (primary) hypertension: Secondary | ICD-10-CM

## 2024-04-05 DIAGNOSIS — E782 Mixed hyperlipidemia: Secondary | ICD-10-CM

## 2024-04-05 MED ORDER — ZOLMITRIPTAN 5 MG PO TABS
5.0000 mg | ORAL_TABLET | ORAL | 6 refills | Status: AC | PRN
  Filled 2024-04-21 – 2024-05-04 (×2): qty 36, 90d supply, fill #0
  Filled 2024-06-23 – 2024-07-21 (×2): qty 36, 90d supply, fill #1

## 2024-04-05 MED ORDER — PRAVASTATIN SODIUM 40 MG PO TABS: 40.0000 mg | ORAL_TABLET | Freq: Every day | ORAL | 1 refills | Status: DC

## 2024-04-06 ENCOUNTER — Other Ambulatory Visit: Payer: Self-pay | Admitting: Nurse Practitioner

## 2024-04-06 DIAGNOSIS — F909 Attention-deficit hyperactivity disorder, unspecified type: Secondary | ICD-10-CM

## 2024-04-06 MED ORDER — AMPHETAMINE-DEXTROAMPHETAMINE 30 MG PO TABS: 30.0000 mg | ORAL_TABLET | Freq: Two times a day (BID) | ORAL | 0 refills | Status: DC

## 2024-04-16 ENCOUNTER — Other Ambulatory Visit (HOSPITAL_BASED_OUTPATIENT_CLINIC_OR_DEPARTMENT_OTHER): Payer: Self-pay

## 2024-04-16 MED ORDER — FLUZONE HIGH-DOSE 0.5 ML IM SUSY
0.5000 mL | PREFILLED_SYRINGE | Freq: Once | INTRAMUSCULAR | 0 refills | Status: AC
  Filled 2024-04-16: qty 0.5, 1d supply, fill #0

## 2024-04-20 ENCOUNTER — Other Ambulatory Visit (HOSPITAL_BASED_OUTPATIENT_CLINIC_OR_DEPARTMENT_OTHER): Payer: Self-pay

## 2024-04-20 ENCOUNTER — Encounter: Payer: Self-pay | Admitting: Nurse Practitioner

## 2024-04-20 MED ORDER — COVID-19 MRNA VAC-TRIS(PFIZER) 30 MCG/0.3ML IM SUSY: PREFILLED_SYRINGE | INTRAMUSCULAR | 0 refills | Status: AC

## 2024-04-21 ENCOUNTER — Other Ambulatory Visit (HOSPITAL_BASED_OUTPATIENT_CLINIC_OR_DEPARTMENT_OTHER): Payer: Self-pay

## 2024-04-22 ENCOUNTER — Other Ambulatory Visit (HOSPITAL_BASED_OUTPATIENT_CLINIC_OR_DEPARTMENT_OTHER): Payer: Self-pay

## 2024-05-03 ENCOUNTER — Other Ambulatory Visit (HOSPITAL_BASED_OUTPATIENT_CLINIC_OR_DEPARTMENT_OTHER): Payer: Self-pay

## 2024-05-04 ENCOUNTER — Other Ambulatory Visit (HOSPITAL_BASED_OUTPATIENT_CLINIC_OR_DEPARTMENT_OTHER): Payer: Self-pay

## 2024-05-10 ENCOUNTER — Other Ambulatory Visit (HOSPITAL_BASED_OUTPATIENT_CLINIC_OR_DEPARTMENT_OTHER): Payer: Self-pay

## 2024-05-10 MED ORDER — COMIRNATY 30 MCG/0.3ML IM SUSY
0.3000 mL | PREFILLED_SYRINGE | Freq: Once | INTRAMUSCULAR | 0 refills | Status: AC
  Filled 2024-05-10: qty 0.3, 1d supply, fill #0

## 2024-06-09 ENCOUNTER — Telehealth: Payer: Self-pay | Admitting: Internal Medicine

## 2024-06-10 ENCOUNTER — Other Ambulatory Visit: Payer: Self-pay

## 2024-06-10 DIAGNOSIS — Z0184 Encounter for antibody response examination: Secondary | ICD-10-CM

## 2024-06-11 LAB — VARICELLA ZOSTER ANTIBODY, IGG: Varicella zoster IgG: REACTIVE

## 2024-06-11 LAB — MEASLES/MUMPS/RUBELLA IMMUNITY
MUMPS ABS, IGG: >300 [AU]/ml (ref >10.9)
RUBEOLA AB, IGG: >300 [AU]/ml (ref >16.4)
Rubella Antibodies, IGG: 15.8 {index} (ref >0.99)

## 2024-06-14 ENCOUNTER — Ambulatory Visit: Payer: Self-pay | Admitting: Nurse Practitioner

## 2024-06-17 ENCOUNTER — Other Ambulatory Visit: Payer: Self-pay | Admitting: Internal Medicine

## 2024-06-17 ENCOUNTER — Other Ambulatory Visit (HOSPITAL_BASED_OUTPATIENT_CLINIC_OR_DEPARTMENT_OTHER): Payer: Self-pay | Admitting: Nurse Practitioner

## 2024-06-17 DIAGNOSIS — G4700 Insomnia, unspecified: Secondary | ICD-10-CM

## 2024-06-17 DIAGNOSIS — F341 Dysthymic disorder: Secondary | ICD-10-CM

## 2024-06-17 NOTE — Telephone Encounter (Signed)
 Last apt 11/13/23.

## 2024-06-23 ENCOUNTER — Other Ambulatory Visit (HOSPITAL_BASED_OUTPATIENT_CLINIC_OR_DEPARTMENT_OTHER): Payer: Self-pay

## 2024-07-11 ENCOUNTER — Other Ambulatory Visit: Payer: Self-pay | Admitting: Internal Medicine

## 2024-07-21 ENCOUNTER — Encounter: Payer: Self-pay | Admitting: Nurse Practitioner

## 2024-07-21 ENCOUNTER — Other Ambulatory Visit (HOSPITAL_BASED_OUTPATIENT_CLINIC_OR_DEPARTMENT_OTHER): Payer: Self-pay

## 2024-07-21 DIAGNOSIS — F909 Attention-deficit hyperactivity disorder, unspecified type: Secondary | ICD-10-CM

## 2024-07-21 MED ORDER — AMPHETAMINE-DEXTROAMPHETAMINE 30 MG PO TABS: 30.0000 mg | ORAL_TABLET | Freq: Two times a day (BID) | ORAL | 0 refills | Status: AC

## 2024-07-22 ENCOUNTER — Other Ambulatory Visit (HOSPITAL_BASED_OUTPATIENT_CLINIC_OR_DEPARTMENT_OTHER): Payer: Self-pay

## 2024-07-23 ENCOUNTER — Other Ambulatory Visit (HOSPITAL_BASED_OUTPATIENT_CLINIC_OR_DEPARTMENT_OTHER): Payer: Self-pay

## 2024-08-05 ENCOUNTER — Other Ambulatory Visit: Payer: Self-pay | Admitting: Nurse Practitioner

## 2024-08-05 DIAGNOSIS — I10 Essential (primary) hypertension: Secondary | ICD-10-CM

## 2024-08-23 ENCOUNTER — Encounter: Payer: Self-pay | Admitting: Nurse Practitioner

## 2024-08-23 ENCOUNTER — Other Ambulatory Visit: Payer: Self-pay

## 2024-08-23 DIAGNOSIS — Z0000 Encounter for general adult medical examination without abnormal findings: Secondary | ICD-10-CM

## 2024-08-23 DIAGNOSIS — R7989 Other specified abnormal findings of blood chemistry: Secondary | ICD-10-CM

## 2024-09-01 ENCOUNTER — Ambulatory Visit: Payer: Self-pay | Admitting: Nurse Practitioner

## 2024-09-01 LAB — CBC WITH DIFFERENTIAL/PLATELET
Basophils Absolute: 0.1 x10E3/uL (ref 0.0–0.2)
Basos: 1 %
EOS (ABSOLUTE): 0.2 x10E3/uL (ref 0.0–0.4)
Eos: 3 %
Hematocrit: 39.5 % (ref 34.0–46.6)
Hemoglobin: 13 g/dL (ref 11.1–15.9)
Immature Grans (Abs): 0 x10E3/uL (ref 0.0–0.1)
Immature Granulocytes: 0 %
Lymphocytes Absolute: 3.1 x10E3/uL (ref 0.7–3.1)
Lymphs: 41 %
MCH: 31.8 pg (ref 26.6–33.0)
MCHC: 32.9 g/dL (ref 31.5–35.7)
MCV: 97 fL (ref 79–97)
Monocytes Absolute: 0.6 x10E3/uL (ref 0.1–0.9)
Monocytes: 8 %
Neutrophils Absolute: 3.5 x10E3/uL (ref 1.4–7.0)
Neutrophils: 47 %
Platelets: 270 x10E3/uL (ref 150–450)
RBC: 4.09 x10E6/uL (ref 3.77–5.28)
RDW: 12.9 % (ref 11.7–15.4)
WBC: 7.6 x10E3/uL (ref 3.4–10.8)

## 2024-09-01 LAB — CMP14+EGFR
ALT: 7 IU/L (ref 0–32)
AST: 19 IU/L (ref 0–40)
Albumin: 4.4 g/dL (ref 3.9–4.9)
Alkaline Phosphatase: 48 IU/L — ABNORMAL LOW (ref 49–135)
BUN/Creatinine Ratio: 22 (ref 12–28)
BUN: 20 mg/dL (ref 8–27)
Bilirubin Total: 0.5 mg/dL (ref 0.0–1.2)
CO2: 20 mmol/L (ref 20–29)
Calcium: 9.3 mg/dL (ref 8.7–10.3)
Chloride: 104 mmol/L (ref 96–106)
Creatinine, Ser: 0.9 mg/dL (ref 0.57–1.00)
Globulin, Total: 2 g/dL (ref 1.5–4.5)
Glucose: 92 mg/dL (ref 70–99)
Potassium: 4.1 mmol/L (ref 3.5–5.2)
Sodium: 139 mmol/L (ref 134–144)
Total Protein: 6.4 g/dL (ref 6.0–8.5)
eGFR: 69 mL/min/1.73

## 2024-09-01 LAB — LIPID PANEL
Chol/HDL Ratio: 2.9 ratio (ref 0.0–4.4)
Cholesterol, Total: 229 mg/dL — ABNORMAL HIGH (ref 100–199)
HDL: 80 mg/dL
LDL Chol Calc (NIH): 130 mg/dL — ABNORMAL HIGH (ref 0–99)
Triglycerides: 111 mg/dL (ref 0–149)
VLDL Cholesterol Cal: 19 mg/dL (ref 5–40)

## 2024-09-01 LAB — VITAMIN D 25 HYDROXY (VIT D DEFICIENCY, FRACTURES): Vit D, 25-Hydroxy: 19.7 ng/mL — AB (ref 30.0–100.0)

## 2024-09-01 LAB — VITAMIN B12: Vitamin B-12: 429 pg/mL (ref 232–1245)

## 2024-09-01 LAB — TSH: TSH: 2.57 u[IU]/mL (ref 0.450–4.500)

## 2024-09-01 LAB — HEMOGLOBIN A1C
Est. average glucose Bld gHb Est-mCnc: 105 mg/dL
Hgb A1c MFr Bld: 5.3 % (ref 4.8–5.6)

## 2024-09-09 ENCOUNTER — Encounter: Payer: Self-pay | Admitting: Nurse Practitioner

## 2024-09-09 ENCOUNTER — Ambulatory Visit: Payer: Medicare Other | Admitting: Nurse Practitioner

## 2024-09-09 VITALS — BP 174/96 | HR 68 | Ht 64.0 in | Wt 158.8 lb

## 2024-09-09 DIAGNOSIS — I10 Essential (primary) hypertension: Secondary | ICD-10-CM | POA: Diagnosis not present

## 2024-09-09 DIAGNOSIS — E782 Mixed hyperlipidemia: Secondary | ICD-10-CM | POA: Diagnosis not present

## 2024-09-09 DIAGNOSIS — E039 Hypothyroidism, unspecified: Secondary | ICD-10-CM

## 2024-09-09 DIAGNOSIS — F341 Dysthymic disorder: Secondary | ICD-10-CM | POA: Diagnosis not present

## 2024-09-09 DIAGNOSIS — N1832 Chronic kidney disease, stage 3b: Secondary | ICD-10-CM | POA: Diagnosis not present

## 2024-09-09 MED ORDER — PRAVASTATIN SODIUM 40 MG PO TABS: 40.0000 mg | ORAL_TABLET | Freq: Every day | ORAL | 3 refills | Status: AC

## 2024-09-09 MED ORDER — LISINOPRIL-HYDROCHLOROTHIAZIDE 10-12.5 MG PO TABS: 1.0000 | ORAL_TABLET | Freq: Every day | ORAL | 3 refills | Status: AC

## 2024-09-09 MED ORDER — SERTRALINE HCL 100 MG PO TABS: 100.0000 mg | ORAL_TABLET | Freq: Every day | ORAL | 3 refills | Status: AC

## 2024-09-09 NOTE — Assessment & Plan Note (Addendum)
 Managed with daily pravastatin . Recent labs show good control. No changes at this time. Diet and exercise discussed.  Orders:   pravastatin  (PRAVACHOL ) 40 MG tablet; Take 1 tablet (40 mg total) by mouth daily.

## 2024-09-09 NOTE — Patient Instructions (Addendum)
 I have sent in the new blood pressure medication. If this does not help get the BP down in the next 3 days, then increase the lisinopril  by taking 1 extra 10mg  and let me know how this works.   Vitamin D3 2000-4000IU once a day. With or without K2, is fine   For all adult patients, I recommend A well balanced diet low in saturated fats, cholesterol, and moderation in carbohydrates.   This can be as simple as monitoring portion sizes and cutting back on sugary beverages such as soda and juice to start with.    Daily water consumption of at least 64 ounces.  Physical activity at least 180 minutes per week, if just starting out.   This can be as simple as taking the stairs instead of the elevator and walking 2-3 laps around the office  purposefully every day.   STD protection, partner selection, and regular testing if high risk.  Limited consumption of alcoholic beverages if alcohol is consumed.  For women, I recommend no more than 7 alcoholic beverages per week, spread out throughout the week.  Avoid binge drinking or consuming large quantities of alcohol in one setting.   Please let me know if you feel you may need help with reduction or quitting alcohol consumption.   Avoidance of nicotine, if used.  Please let me know if you feel you may need help with reduction or quitting nicotine use.   Daily mental health attention.  This can be in the form of 5 minute daily meditation, prayer, journaling, yoga, reflection, etc.   Purposeful attention to your emotions and mental state can significantly improve your overall wellbeing  and  Health.  Please know that I am here to help you with all of your health care goals and am happy to work with you to find a solution that works best for you.  The greatest advice I have received with any changes in life are to take it one step at a time, that even means if all you can focus on is the next 60 seconds, then do that and celebrate your victories.   With any changes in life, you will have set backs, and that is OK. The important thing to remember is, if you have a set back, it is not a failure, it is an opportunity to try again!  Health Maintenance Recommendations Screening Testing Mammogram Every 1 -2 years based on history and risk factors Starting at age 51 Pap Smear Ages 21-39 every 3 years Ages 5-65 every 5 years with HPV testing More frequent testing may be required based on results and history Colon Cancer Screening Every 1-10 years based on test performed, risk factors, and history Starting at age 74 Bone Density Screening Every 2-10 years based on history Starting at age 6 for women Recommendations for men differ based on medication usage, history, and risk factors AAA Screening One time ultrasound Men 20-77 years old who have every smoked Lung Cancer Screening Low Dose Lung CT every 12 months Age 45-80 years with a 30 pack-year smoking history who still smoke or who have quit within the last 15 years  Screening Labs Routine  Labs: Complete Blood Count (CBC), Complete Metabolic Panel (CMP), Cholesterol (Lipid Panel) Every 6-12 months based on history and medications May be recommended more frequently based on current conditions or previous results Hemoglobin A1c Lab Every 3-12 months based on history and previous results Starting at age 5 or earlier with diagnosis of diabetes, high  cholesterol, BMI >26, and/or risk factors Frequent monitoring for patients with diabetes to ensure blood sugar control Thyroid  Panel (TSH w/ T3 & T4) Every 6 months based on history, symptoms, and risk factors May be repeated more often if on medication HIV One time testing for all patients 60 and older May be repeated more frequently for patients with increased risk factors or exposure Hepatitis C One time testing for all patients 34 and older May be repeated more frequently for patients with increased risk factors or  exposure Gonorrhea, Chlamydia Every 12 months for all sexually active persons 13-24 years Additional monitoring may be recommended for those who are considered high risk or who have symptoms PSA Men 96-58 years old with risk factors Additional screening may be recommended from age 1-69 based on risk factors, symptoms, and history  Vaccine Recommendations Tetanus Booster All adults every 10 years Flu Vaccine All patients 6 months and older every year COVID Vaccine All patients 12 years and older Initial dosing with booster May recommend additional booster based on age and health history HPV Vaccine 2 doses all patients age 7-26 Dosing may be considered for patients over 26 Shingles Vaccine (Shingrix) 2 doses all adults 55 years and older Pneumonia (Pneumovax 23) All adults 65 years and older May recommend earlier dosing based on health history Pneumonia (Prevnar 70) All adults 65 years and older Dosed 1 year after Pneumovax 23  Additional Screening, Testing, and Vaccinations may be recommended on an individualized basis based on family history, health history, risk factors, and/or exposure.

## 2024-09-09 NOTE — Assessment & Plan Note (Addendum)
 Blood pressure is significantly elevated at 180/100 mmHg. BP improved through visit, but remained higher than safe. Previously on metoprolol , which was tapered off due to palpitations. Currently on lisinopril  10 mg daily, but blood pressure remains high. Discussed adding hydrochlorothiazide  to lisinopril  to improve blood pressure control as this was successful in the past.  She is open to increased urination as a side effect. Goal is to achieve blood pressure of 130/80 mmHg or less. Discussed potential hereditary component of hypertension and limited impact of low-salt diet. Encouraged Mediterranean diet and regular exercise. - Prescribed lisinopril -hydrochlorothiazide  10/12.5 mg daily. - If no improvement in 2-3 days, add additional 10 mg of lisinopril . - Monitor blood pressure regularly. - Encouraged Mediterranean diet and regular exercise. Orders:   lisinopril -hydrochlorothiazide  (ZESTORETIC ) 10-12.5 MG tablet; Take 1 tablet by mouth daily.

## 2024-09-09 NOTE — Assessment & Plan Note (Addendum)
 Anxiety and depression symptoms well controlled on current regimen. No changes noted. Will plan to continue current regimen and monitor for changes.  Orders:   sertraline  (ZOLOFT ) 100 MG tablet; Take 1 tablet (100 mg total) by mouth daily.

## 2024-09-09 NOTE — Assessment & Plan Note (Addendum)
 Managed with endocrinology. Recent labs were well controlled. No concerns noted. WIll continue to follow.

## 2024-09-09 NOTE — Progress Notes (Addendum)
 "  Dana Doing, DNP, AGNP-c [facility]  [address] [place], [place] [address] [phone]   ESTABLISHED PATIENT- Chronic Health and/or Follow-Up Visit on 09/09/2024  Blood pressure (!) 174/96, pulse 68, height 5' 4 (1.626 m), weight 158 lb 12.8 oz (72 kg).   Subjective:  Medical Management of Chronic Issues (Med check + per ins. They stopped her metoprolol  and her BP has been running high since then today it was 180/100)  History of Present Illness Dana Finley is a 69 year old female with hypertension who presents for a Medicare medication check and elevated blood pressure.  She has been experiencing consistently high blood pressure readings, with a recent measurement of 180/100. She has been utilizing an old prescription of lisinopril , taking an additional 5 mg at night, which she finds helps lower her blood pressure, but she has run out of it. She recently tapered off metoprolol , which was started for palpitations, and she reports no current palpitations. She is currently taking 10 mg of lisinopril  once daily in the morning and was adding the additional 5mg  at night as mentioned before.  Prior to starting metoprolol , she was on Lisinopril  20mg -hydrochlorothiazide  12.5mg  once daily, but tapered down due to low blood pressure when metoprolol  was added.   She experiences frequent headaches but has no chest pain, dizziness, or vision changes. There is no swelling in her feet or ankles, and no changes in bowel or bladder habits. She has not had any vaginal bleeding. She is concerned about her high blood pressure's impact on her kidneys and recalls that her kidney specialist also measured high readings.  She has a family history of high blood pressure, as her mother also had very high blood pressure. She has been trying a no-salt diet without significant improvement in her blood pressure. She engages in regular exercise, including walking five miles on a treadmill,  although she has balance issues and has previously attended physical therapy without much benefit.  She is also concerned about her vitamin Dana Finley  levels, which were noted to be low. She inquires about the appropriate daily intake of vitamin Dana Finley .   Additionally, she mentions her brother is in hospice care due to myelodysplastic syndrome, and she wants to monitor her own blood work closely. Her blood counts were normal on her recent lab work.   ROS negative except for what is listed in HPI. History, Medications, Surgery, SDOH, and Family History reviewed and updated as appropriate.  Objective:  Physical Exam Vitals and nursing note reviewed.  Constitutional:      Appearance: Normal appearance.  HENT:     Head: Normocephalic.     Right Ear: Tympanic membrane normal.     Left Ear: Tympanic membrane normal.     Nose: Nose normal.     Mouth/Throat:     Mouth: Mucous membranes are moist.  Eyes:     Pupils: Pupils are equal, round, and reactive to light.  Neck:     Vascular: No carotid bruit.  Cardiovascular:     Rate and Rhythm: Normal rate and regular rhythm.     Pulses: Normal pulses.     Heart sounds: Normal heart sounds. No murmur heard. Pulmonary:     Effort: Pulmonary effort is normal.     Breath sounds: Normal breath sounds.  Abdominal:     General: Bowel sounds are normal. There is no distension.     Palpations: Abdomen is soft.     Tenderness: There is no abdominal tenderness. There  is no right CVA tenderness, left CVA tenderness or guarding.  Musculoskeletal:        General: Normal range of motion.     Cervical back: Normal range of motion.     Right lower leg: No edema.     Left lower leg: No edema.  Skin:    General: Skin is warm and dry.     Capillary Refill: Capillary refill takes less than 2 seconds.  Neurological:     General: No focal deficit present.     Mental Status: She is alert and oriented to person, place, and time.  Psychiatric:        Mood and Affect: Mood  normal.         Assessment & Plan:   Assessment & Plan Essential hypertension Blood pressure is significantly elevated at 180/100 mmHg. BP improved through visit, but remained higher than safe. Previously on metoprolol , which was tapered off due to palpitations. Currently on lisinopril  10 mg daily, but blood pressure remains high. Discussed adding hydrochlorothiazide  to lisinopril  to improve blood pressure control as this was successful in the past.  She is open to increased urination as a side effect. Goal is to achieve blood pressure of 130/80 mmHg or less. Discussed potential hereditary component of hypertension and limited impact of low-salt diet. Encouraged Mediterranean diet and regular exercise. - Prescribed lisinopril -hydrochlorothiazide  10/12.5 mg daily. - If no improvement in 2-3 days, add additional 10 mg of lisinopril . - Monitor blood pressure regularly. - Encouraged Mediterranean diet and regular exercise. Orders:   lisinopril -hydrochlorothiazide  (ZESTORETIC ) 10-12.5 MG tablet; Take 1 tablet by mouth daily.  Mixed hyperlipidemia Managed with daily pravastatin . Recent labs show good control. No changes at this time. Diet and exercise discussed.  Orders:   pravastatin  (PRAVACHOL ) 40 MG tablet; Take 1 tablet (40 mg total) by mouth daily.  Chronic kidney disease, stage 3b (HCC) Chronic kidney disease stage 3b. Blood pressure control is crucial to prevent further kidney damage. Discussed importance of maintaining blood pressure below 130/80 mmHg to protect kidney function. - Continue to monitor kidney function regularly. - Ensure blood pressure is controlled to protect kidney function.    Hypothyroidism, unspecified type Managed with endocrinology. Recent labs were well controlled. No concerns noted. WIll continue to follow.     ANXIETY DEPRESSION Anxiety and depression symptoms well controlled on current regimen. No changes noted. Will plan to continue current regimen and  monitor for changes.  Orders:   sertraline  (ZOLOFT ) 100 MG tablet; Take 1 tablet (100 mg total) by mouth daily.    Dana Finley Dana Brantlee Hinde, DNP, AGNP-c    "

## 2024-09-09 NOTE — Addendum Note (Signed)
 Addended by: ORIS DANKER E on: 09/09/2024 08:27 PM   Modules accepted: Level of Service

## 2024-09-09 NOTE — Assessment & Plan Note (Addendum)
 Chronic kidney disease stage 3b. Blood pressure control is crucial to prevent further kidney damage. Discussed importance of maintaining blood pressure below 130/80 mmHg to protect kidney function. - Continue to monitor kidney function regularly. - Ensure blood pressure is controlled to protect kidney function.

## 2025-03-01 ENCOUNTER — Ambulatory Visit: Payer: Self-pay

## 2025-09-22 ENCOUNTER — Encounter: Payer: PRIVATE HEALTH INSURANCE | Admitting: Nurse Practitioner
